# Patient Record
Sex: Female | Born: 1975 | Race: White | Hispanic: No | Marital: Married | Smoking: Former smoker
Health system: Southern US, Community
[De-identification: ages and names within clinical notes are randomized; demographics above are authoritative.]

## PROBLEM LIST (undated history)

## (undated) DIAGNOSIS — O09299 Supervision of pregnancy with other poor reproductive or obstetric history, unspecified trimester: Secondary | ICD-10-CM

## (undated) DIAGNOSIS — J45909 Unspecified asthma, uncomplicated: Secondary | ICD-10-CM

## (undated) DIAGNOSIS — N390 Urinary tract infection, site not specified: Secondary | ICD-10-CM

## (undated) DIAGNOSIS — U099 Post covid-19 condition, unspecified: Secondary | ICD-10-CM

## (undated) DIAGNOSIS — R51 Headache: Secondary | ICD-10-CM

## (undated) DIAGNOSIS — Z5189 Encounter for other specified aftercare: Secondary | ICD-10-CM

## (undated) DIAGNOSIS — L509 Urticaria, unspecified: Secondary | ICD-10-CM

## (undated) DIAGNOSIS — N809 Endometriosis, unspecified: Secondary | ICD-10-CM

## (undated) DIAGNOSIS — Z3490 Encounter for supervision of normal pregnancy, unspecified, unspecified trimester: Principal | ICD-10-CM

## (undated) DIAGNOSIS — J069 Acute upper respiratory infection, unspecified: Secondary | ICD-10-CM

## (undated) DIAGNOSIS — T783XXA Angioneurotic edema, initial encounter: Secondary | ICD-10-CM

## (undated) HISTORY — PX: ANKLE SURGERY: SHX546

## (undated) HISTORY — PX: OVARIAN CYST REMOVAL: SHX89

## (undated) HISTORY — DX: Post covid-19 condition, unspecified: U09.9

## (undated) HISTORY — DX: Endometriosis, unspecified: N80.9

## (undated) HISTORY — PX: LAPAROSCOPY: SHX197

## (undated) HISTORY — DX: Headache: R51

## (undated) HISTORY — PX: LYSIS OF ADHESION: SHX5961

## (undated) HISTORY — DX: Unspecified asthma, uncomplicated: J45.909

## (undated) HISTORY — PX: BLADDER SURGERY: SHX569

## (undated) HISTORY — DX: Encounter for other specified aftercare: Z51.89

## (undated) HISTORY — DX: Acute upper respiratory infection, unspecified: J06.9

## (undated) HISTORY — DX: Supervision of pregnancy with other poor reproductive or obstetric history, unspecified trimester: O09.299

## (undated) HISTORY — DX: Angioneurotic edema, initial encounter: T78.3XXA

## (undated) HISTORY — DX: Urticaria, unspecified: L50.9

## (undated) HISTORY — DX: Urinary tract infection, site not specified: N39.0

---

## 2011-06-25 NOTE — L&D Delivery Note (Signed)
Delivery Note At 7:34 AM a viable female was delivered via Vaginal, Spontaneous Delivery (Presentation: Left Occiput Posterior).  APGAR: 9, 9; weight P .   Placenta status: Intact, Spontaneous.  Cord: 3 vessels with the following complications: Nuchal.    Anesthesia: Epidural  Episiotomy: none   Lacerations: 2nd degree;Perineal Suture Repair: 3.0 vicryl rapide Est. Blood Loss (mL): 400cc  Mom to postpartum.  Baby to stay with mom.  BOVARD,Tivon Lemoine 06/02/2012, 8:01 AM  BR/ A+/ Contra at 6 wk nuvaring/RI

## 2011-10-15 LAB — OB RESULTS CONSOLE HEPATITIS B SURFACE ANTIGEN: Hepatitis B Surface Ag: NEGATIVE

## 2011-10-15 LAB — OB RESULTS CONSOLE ABO/RH: RH Type: POSITIVE

## 2011-10-15 LAB — OB RESULTS CONSOLE ANTIBODY SCREEN: Antibody Screen: NEGATIVE

## 2011-10-15 LAB — OB RESULTS CONSOLE RUBELLA ANTIBODY, IGM: Rubella: IMMUNE

## 2012-05-26 ENCOUNTER — Telehealth (HOSPITAL_COMMUNITY): Payer: Self-pay | Admitting: *Deleted

## 2012-05-26 ENCOUNTER — Encounter (HOSPITAL_COMMUNITY): Payer: Self-pay | Admitting: *Deleted

## 2012-05-26 NOTE — Telephone Encounter (Signed)
Preadmission screen  

## 2012-05-30 ENCOUNTER — Encounter (HOSPITAL_COMMUNITY): Payer: Self-pay

## 2012-05-30 ENCOUNTER — Inpatient Hospital Stay (HOSPITAL_COMMUNITY)
Admission: AD | Admit: 2012-05-30 | Discharge: 2012-05-30 | Disposition: A | Discharge status: Home / Self Care | Payer: BC Managed Care – PPO | Source: Ambulatory Visit | Attending: Obstetrics and Gynecology | Admitting: Obstetrics and Gynecology

## 2012-05-30 DIAGNOSIS — O479 False labor, unspecified: Secondary | ICD-10-CM | POA: Insufficient documentation

## 2012-05-30 NOTE — MAU Note (Signed)
Patient states she is having contractions every 4 minutes. Denies any leaking or bleeding and reports good fetal movement.

## 2012-05-30 NOTE — Progress Notes (Signed)
Written and verbal d/c instructions given and understanding voiced. 

## 2012-05-30 NOTE — Progress Notes (Signed)
Dr Ambrose Mantle notified of pt's admission and status. Aware of ctx pattern, sve, reactive strip, hx PPH with transfusion, low lying placenta with this preg that resolved. Will reck cervix and d/c home if no change

## 2012-06-01 ENCOUNTER — Encounter (HOSPITAL_COMMUNITY): Payer: Self-pay

## 2012-06-01 DIAGNOSIS — Z3490 Encounter for supervision of normal pregnancy, unspecified, unspecified trimester: Secondary | ICD-10-CM

## 2012-06-01 HISTORY — DX: Encounter for supervision of normal pregnancy, unspecified, unspecified trimester: Z34.90

## 2012-06-01 MED ORDER — COMPLETENATE 29-1 MG PO CHEW
2.0000 | CHEWABLE_TABLET | Freq: Every day | ORAL | Status: DC
  Filled 2012-06-01: qty 2

## 2012-06-01 MED ORDER — FAMOTIDINE 20 MG PO TABS
20.0000 mg | ORAL_TABLET | Freq: Every day | ORAL | Status: DC
  Filled 2012-06-01: qty 1

## 2012-06-01 NOTE — H&P (Signed)
Cheryl Wright is a 36 y.o. female [phone] at 40wk for iol given term and favorable cervix.  Pregnancy complicated by h/o PIH and 36 wk iol.  Also PP Hmg, requiring blood transfusion.  Pregnancy had posterior placenta previa, now resolved as of [place] 11/14.  +FM, no LOF, no VB, occ ctx.D/W pt r/b/a of IOL.  Transfer of care at 30 wk.  Maternal Medical History:  Contractions: Frequency: irregular.   Perceived severity is moderate.    Fetal activity: Perceived fetal activity is normal.    Prenatal complications: no prenatal complications   OB History    Grav Para Term Preterm Abortions TAB SAB Ect Mult Living   3 1  1 1  1  1 2     [id number] G1 Missed AB, cytotec G2 twins, 36 wk IOL, VAVD x 2, PP Hmg, IVF G3 present No abn pap, no STDs Past Medical History  Diagnosis Date  . Endometriosis   . Frequent UTI   . Headache     migraine  . Blood transfusion without reported diagnosis     pp hemorrhage 2011 twin delivery  . Hx of preeclampsia, prior pregnancy, currently pregnant   . Normal pregnancy 06/01/2012   Past Surgical History  Procedure Date  . Ankle surgery   . Laparoscopy   . Bladder surgery     dilation   Family History: family history includes Cancer in her paternal grandfather and paternal grandmother; Hyperlipidemia in her mother; Hypertension in her mother; and Rheum arthritis in her father. Social History:  reports that she has quit smoking. She has never used smokeless tobacco. She reports that she does not drink alcohol or use illicit drugs.married Meds PNV, Prilosec ALL PCN, no latex  Prenatal Transfer Tool  Maternal Diabetes: No Genetic Screening: Normal Maternal Ultrasounds/Referrals: Abnormal:  Findings:   Other:previa Fetal Ultrasounds or other Referrals:  None Maternal Substance Abuse:  No Significant Maternal Medications:  None Significant Maternal Lab Results:  Lab values include: Group B Strep negative Other Comments:  h/o PP Hmg, h/o PreE and 36wk  iol, h/o post previa - resolved  Review of Systems  Constitutional: Negative.   HENT: Negative.   Eyes: Negative.   Respiratory: Negative.   Cardiovascular: Negative.   Gastrointestinal: Negative.   Genitourinary: Negative.   Musculoskeletal: Negative.   Skin: Negative.   Neurological: Negative.   Psychiatric/Behavioral: Negative.       Last menstrual period 08/27/2011, unknown if currently breastfeeding. Maternal Exam:  Abdomen: Fundal height is appropriate for gestation.   Estimated fetal weight is 7-8#.   Fetal presentation: vertex  Introitus: Normal vulva. Normal vagina.  Pelvis: adequate for delivery.   Cervix: Cervix evaluated by digital exam.     Physical Exam  Constitutional: She is oriented to person, place, and time. She appears well-developed and well-nourished.  HENT:  Head: Normocephalic and atraumatic.  Eyes: Conjunctivae normal are normal. Pupils are equal, round, and reactive to light.  Neck: Normal range of motion. Neck supple.  Cardiovascular: Normal rate and regular rhythm.   Respiratory: Effort normal and breath sounds normal. No respiratory distress.  GI: Soft. Bowel sounds are normal. There is no tenderness.  Musculoskeletal: Normal range of motion.  Neurological: She is alert and oriented to person, place, and time.  Skin: Skin is warm and dry.  Psychiatric: She has a normal mood and affect. Her behavior is normal.    Prenatal labs: ABO, Rh: A/Positive/-- (04/23 0000) Antibody: Negative (04/23 0000) Rubella: Immune (04/23 0000) RPR:  Nonreactive (04/23 0000)  HBsAg: Negative (04/23 0000)  HIV: Non-reactive (04/23 0000)  GBS: Negative (11/18 0000)  Hgb 14.1/ Pap WNL HR HPV neg/Ur Cx neg/ Plt 259K/ GC neg/ Chl neg/ glucola 91/ CF neg/First Tri Scr neg  [place] nl AFI, nl anat, female post previa Resolved 11/14  Assessment/Plan: 36yo [id number] at 40+ for IOL given term and elective gbbs neg, no prophylaxis H/o PP Hmg - cytotec at delivery AROM and  pitocin Epidural prn  Cheryl Wright,Cheryl Wright 06/01/2012, 8:17 PM

## 2012-06-02 ENCOUNTER — Inpatient Hospital Stay (HOSPITAL_COMMUNITY)
Admission: AD | Admit: 2012-06-02 | Discharge: 2012-06-04 | DRG: 373 | Disposition: A | Discharge status: Home / Self Care | Payer: BC Managed Care – PPO | Source: Ambulatory Visit | Attending: Obstetrics and Gynecology | Admitting: Obstetrics and Gynecology

## 2012-06-02 ENCOUNTER — Encounter (HOSPITAL_COMMUNITY): Payer: Self-pay | Admitting: *Deleted

## 2012-06-02 ENCOUNTER — Inpatient Hospital Stay (HOSPITAL_COMMUNITY): Payer: BC Managed Care – PPO | Admitting: Anesthesiology

## 2012-06-02 ENCOUNTER — Encounter (HOSPITAL_COMMUNITY): Payer: Self-pay | Admitting: Anesthesiology

## 2012-06-02 ENCOUNTER — Encounter (HOSPITAL_COMMUNITY): Payer: Self-pay | Admitting: Obstetrics and Gynecology

## 2012-06-02 ENCOUNTER — Inpatient Hospital Stay (HOSPITAL_COMMUNITY)
Admission: RE | Admit: 2012-06-02 | Discharge: 2012-06-02 | Payer: BC Managed Care – PPO | Source: Ambulatory Visit | Attending: Obstetrics and Gynecology | Admitting: Obstetrics and Gynecology

## 2012-06-02 DIAGNOSIS — Z3490 Encounter for supervision of normal pregnancy, unspecified, unspecified trimester: Secondary | ICD-10-CM

## 2012-06-02 HISTORY — DX: Encounter for supervision of normal pregnancy, unspecified, unspecified trimester: Z34.90

## 2012-06-02 LAB — CBC
HCT: 40.9 % (ref 36.0–46.0)
Hemoglobin: 13.9 g/dL (ref 12.0–15.0)
MCH: 34.4 pg — ABNORMAL HIGH (ref 26.0–34.0)
MCHC: 34 g/dL (ref 30.0–36.0)
MCV: 101.2 fL — ABNORMAL HIGH (ref 78.0–100.0)
Platelets: 191 10*3/uL (ref 150–400)
RBC: 4.04 MIL/uL (ref 3.87–5.11)
RDW: 13.8 % (ref 11.5–15.5)
WBC: 13.3 10*3/uL — ABNORMAL HIGH (ref 4.0–10.5)

## 2012-06-02 LAB — TYPE AND SCREEN
ABO/RH(D): A POS
Antibody Screen: NEGATIVE

## 2012-06-02 MED ORDER — DIPHENHYDRAMINE HCL 25 MG PO CAPS
25.0000 mg | ORAL_CAPSULE | Freq: Four times a day (QID) | ORAL | Status: DC | PRN
  Administered 2012-06-02: 25 mg via ORAL
  Filled 2012-06-02: qty 1

## 2012-06-02 MED ORDER — FENTANYL 2.5 MCG/ML BUPIVACAINE 1/10 % EPIDURAL INFUSION (WH - ANES)
INTRAMUSCULAR | Status: DC | PRN
  Administered 2012-06-02: 15 mL/h via EPIDURAL

## 2012-06-02 MED ORDER — LIDOCAINE HCL (PF) 1 % IJ SOLN
30.0000 mL | INTRAMUSCULAR | Status: DC | PRN
  Filled 2012-06-02: qty 30

## 2012-06-02 MED ORDER — PHENYLEPHRINE 40 MCG/ML (10ML) SYRINGE FOR IV PUSH (FOR BLOOD PRESSURE SUPPORT): 80.0000 ug | PREFILLED_SYRINGE | INTRAVENOUS | Status: DC | PRN

## 2012-06-02 MED ORDER — SENNOSIDES-DOCUSATE SODIUM 8.6-50 MG PO TABS
2.0000 | ORAL_TABLET | Freq: Every day | ORAL | Status: DC
  Administered 2012-06-02 – 2012-06-03 (×2): 2 via ORAL

## 2012-06-02 MED ORDER — FENTANYL 2.5 MCG/ML BUPIVACAINE 1/10 % EPIDURAL INFUSION (WH - ANES)
14.0000 mL/h | INTRAMUSCULAR | Status: DC
  Filled 2012-06-02: qty 125

## 2012-06-02 MED ORDER — LACTATED RINGERS IV SOLN
500.0000 mL | Freq: Once | INTRAVENOUS | Status: AC
  Administered 2012-06-02: 1000 mL via INTRAVENOUS

## 2012-06-02 MED ORDER — DIBUCAINE 1 % RE OINT
1.0000 "application " | TOPICAL_OINTMENT | RECTAL | Status: DC | PRN
  Filled 2012-06-02 (×2): qty 28

## 2012-06-02 MED ORDER — OXYCODONE-ACETAMINOPHEN 5-325 MG PO TABS
1.0000 | ORAL_TABLET | ORAL | Status: DC | PRN
  Administered 2012-06-03: 1 via ORAL
  Filled 2012-06-02: qty 1

## 2012-06-02 MED ORDER — CITRIC ACID-SODIUM CITRATE 334-500 MG/5ML PO SOLN
30.0000 mL | ORAL | Status: DC | PRN
  Filled 2012-06-02: qty 15

## 2012-06-02 MED ORDER — TETANUS-DIPHTH-ACELL PERTUSSIS 5-2.5-18.5 LF-MCG/0.5 IM SUSP: 0.5000 mL | Freq: Once | INTRAMUSCULAR | Status: DC

## 2012-06-02 MED ORDER — FAMOTIDINE 20 MG PO TABS
20.0000 mg | ORAL_TABLET | Freq: Every day | ORAL | Status: DC
  Administered 2012-06-02 – 2012-06-04 (×3): 20 mg via ORAL
  Filled 2012-06-02 (×3): qty 1

## 2012-06-02 MED ORDER — COMPLETENATE 29-1 MG PO CHEW
1.0000 | CHEWABLE_TABLET | Freq: Every day | ORAL | Status: DC
  Administered 2012-06-02 – 2012-06-04 (×3): 1 via ORAL
  Filled 2012-06-02 (×3): qty 1

## 2012-06-02 MED ORDER — LIDOCAINE HCL (PF) 1 % IJ SOLN
INTRAMUSCULAR | Status: DC | PRN
  Administered 2012-06-02: 5 mL
  Administered 2012-06-02: 4 mL

## 2012-06-02 MED ORDER — ONDANSETRON HCL 4 MG PO TABS: 4.0000 mg | ORAL_TABLET | ORAL | Status: DC | PRN

## 2012-06-02 MED ORDER — OXYTOCIN BOLUS FROM INFUSION: 500.0000 mL | INTRAVENOUS | Status: DC

## 2012-06-02 MED ORDER — ACETAMINOPHEN 325 MG PO TABS: 650.0000 mg | ORAL_TABLET | ORAL | Status: DC | PRN

## 2012-06-02 MED ORDER — IBUPROFEN 600 MG PO TABS
600.0000 mg | ORAL_TABLET | Freq: Four times a day (QID) | ORAL | Status: DC
  Administered 2012-06-02 – 2012-06-04 (×8): 600 mg via ORAL
  Filled 2012-06-02 (×8): qty 1

## 2012-06-02 MED ORDER — EPHEDRINE 5 MG/ML INJ
10.0000 mg | INTRAVENOUS | Status: DC | PRN
  Filled 2012-06-02: qty 4

## 2012-06-02 MED ORDER — DIPHENHYDRAMINE HCL 50 MG/ML IJ SOLN: 12.5000 mg | INTRAMUSCULAR | Status: DC | PRN

## 2012-06-02 MED ORDER — ONDANSETRON HCL 4 MG/2ML IJ SOLN: 4.0000 mg | INTRAMUSCULAR | Status: DC | PRN

## 2012-06-02 MED ORDER — IBUPROFEN 600 MG PO TABS: 600.0000 mg | ORAL_TABLET | Freq: Four times a day (QID) | ORAL | Status: DC | PRN

## 2012-06-02 MED ORDER — LANOLIN HYDROUS EX OINT: TOPICAL_OINTMENT | CUTANEOUS | Status: DC | PRN

## 2012-06-02 MED ORDER — PHENYLEPHRINE 40 MCG/ML (10ML) SYRINGE FOR IV PUSH (FOR BLOOD PRESSURE SUPPORT)
80.0000 ug | PREFILLED_SYRINGE | INTRAVENOUS | Status: DC | PRN
  Filled 2012-06-02: qty 5

## 2012-06-02 MED ORDER — EPHEDRINE 5 MG/ML INJ: 10.0000 mg | INTRAVENOUS | Status: DC | PRN

## 2012-06-02 MED ORDER — ONDANSETRON HCL 4 MG/2ML IJ SOLN: 4.0000 mg | Freq: Four times a day (QID) | INTRAMUSCULAR | Status: DC | PRN

## 2012-06-02 MED ORDER — FLEET ENEMA 7-19 GM/118ML RE ENEM: 1.0000 | ENEMA | RECTAL | Status: DC | PRN

## 2012-06-02 MED ORDER — WITCH HAZEL-GLYCERIN EX PADS: 1.0000 "application " | MEDICATED_PAD | CUTANEOUS | Status: DC | PRN

## 2012-06-02 MED ORDER — OXYCODONE-ACETAMINOPHEN 5-325 MG PO TABS: 1.0000 | ORAL_TABLET | ORAL | Status: DC | PRN

## 2012-06-02 MED ORDER — PRENATAL MULTIVITAMIN CH: 1.0000 | ORAL_TABLET | Freq: Every day | ORAL | Status: DC

## 2012-06-02 MED ORDER — SIMETHICONE 80 MG PO CHEW
80.0000 mg | CHEWABLE_TABLET | ORAL | Status: DC | PRN
  Administered 2012-06-02 – 2012-06-04 (×2): 80 mg via ORAL

## 2012-06-02 MED ORDER — BENZOCAINE-MENTHOL 20-0.5 % EX AERO
1.0000 "application " | INHALATION_SPRAY | CUTANEOUS | Status: DC | PRN
  Filled 2012-06-02 (×2): qty 56

## 2012-06-02 MED ORDER — LACTATED RINGERS IV SOLN
INTRAVENOUS | Status: DC
  Administered 2012-06-02: 04:00:00 via INTRAVENOUS

## 2012-06-02 MED ORDER — PRENATAL 19 29-1 MG PO CHEW: 2.0000 | CHEWABLE_TABLET | Freq: Every day | ORAL | Status: DC

## 2012-06-02 MED ORDER — ZOLPIDEM TARTRATE 5 MG PO TABS: 5.0000 mg | ORAL_TABLET | Freq: Every evening | ORAL | Status: DC | PRN

## 2012-06-02 MED ORDER — LACTATED RINGERS IV SOLN: INTRAVENOUS | Status: DC

## 2012-06-02 MED ORDER — OXYTOCIN 40 UNITS IN LACTATED RINGERS INFUSION - SIMPLE MED
62.5000 mL/h | INTRAVENOUS | Status: DC
  Administered 2012-06-02: 62.5 mL/h via INTRAVENOUS
  Filled 2012-06-02: qty 1000

## 2012-06-02 MED ORDER — MISOPROSTOL 200 MCG PO TABS
ORAL_TABLET | ORAL | Status: AC
  Filled 2012-06-02: qty 4

## 2012-06-02 MED ORDER — LACTATED RINGERS IV SOLN: 500.0000 mL | INTRAVENOUS | Status: DC | PRN

## 2012-06-02 NOTE — MAU Note (Signed)
ROM at 0230, contractions worsening

## 2012-06-02 NOTE — Anesthesia Procedure Notes (Signed)
Epidural Patient location during procedure: OB Start time: 06/02/2012 5:20 AM  Staffing Anesthesiologist: Hilding Quintanar A. Performed by: anesthesiologist   Preanesthetic Checklist Completed: patient identified, site marked, surgical consent, pre-op evaluation, timeout performed, IV checked, risks and benefits discussed and monitors and equipment checked  Epidural Patient position: sitting Prep: site prepped and draped and DuraPrep Patient monitoring: continuous pulse ox and blood pressure Approach: midline Injection technique: LOR air  Needle:  Needle type: Tuohy  Needle gauge: 17 G Needle length: 9 cm and 9 Needle insertion depth: 5 cm cm Catheter type: closed end flexible Catheter size: 19 Gauge Catheter at skin depth: 10 cm Test dose: negative and Other  Assessment Events: blood not aspirated, injection not painful, no injection resistance, negative IV test and no paresthesia  Additional Notes Patient identified. Risks and benefits discussed including failed block, incomplete  Pain control, post dural puncture headache, nerve damage, paralysis, blood pressure Changes, nausea, vomiting, reactions to medications-both toxic and allergic and post Partum back pain. All questions were answered. Patient expressed understanding and wished to proceed. Sterile technique was used throughout procedure. Epidural site was Dressed with sterile barrier dressing. No paresthesias, signs of intravascular injection Or signs of intrathecal spread were encountered.  Patient was more comfortable after the epidural was dosed. Please see RN's note for documentation of vital signs and FHR which are stable.

## 2012-06-02 NOTE — Progress Notes (Signed)
Transferred pt to room 110 via wheelchair, holding infant, FOB at pt's side, report given to Mother/baby nurse.

## 2012-06-02 NOTE — Anesthesia Postprocedure Evaluation (Signed)
  Anesthesia Post-op Note  Patient: Cheryl Wright  Procedure(s) Performed: * No procedures listed *  Patient Location: PACU and Mother/Baby  Anesthesia Type:Epidural  Level of Consciousness: awake, alert , oriented and patient cooperative  Airway and Oxygen Therapy: Patient Spontanous Breathing  Post-op Pain: none  Post-op Assessment: Post-op Vital signs reviewed and Patient's Cardiovascular Status Stable  Post-op Vital Signs: Reviewed and stable  Complications: No apparent anesthesia complications

## 2012-06-02 NOTE — Anesthesia Preprocedure Evaluation (Signed)
Anesthesia Evaluation  Patient identified by MRN, date of birth, ID band Patient awake    Reviewed: Allergy & Precautions, H&P , Patient's Chart, lab work & pertinent test results  Airway Mallampati: II TM Distance: >3 FB Neck ROM: full    Dental No notable dental hx. (+) Teeth Intact   Pulmonary neg pulmonary ROS,  breath sounds clear to auscultation  Pulmonary exam normal       Cardiovascular negative cardio ROS  Rhythm:regular Rate:Normal     Neuro/Psych  Headaches, negative psych ROS   GI/Hepatic Neg liver ROS, Medicated and Controlled,  Endo/Other  negative endocrine ROS  Renal/GU negative Renal ROS  negative genitourinary   Musculoskeletal   Abdominal Normal abdominal exam  (+)   Peds  Hematology Hx/o Blood transfusion for [facility]   Anesthesia Other Findings   Reproductive/Obstetrics (+) Pregnancy                           Anesthesia Physical Anesthesia Plan  ASA: II  Anesthesia Plan: Epidural   Post-op Pain Management:    Induction:   Airway Management Planned:   Additional Equipment:   Intra-op Plan:   Post-operative Plan:   Informed Consent: I have reviewed the patients History and Physical, chart, labs and discussed the procedure including the risks, benefits and alternatives for the proposed anesthesia with the patient or authorized representative who has indicated his/her understanding and acceptance.     Plan Discussed with: Anesthesiologist  Anesthesia Plan Comments:         Anesthesia Quick Evaluation

## 2012-06-02 NOTE — Progress Notes (Addendum)
Scheduled for induction later this am, here for reg ctx and leaking fluid, VE 4 cm, membranes ruptured. FHT- Cat I Ok to walk for now at pt request

## 2012-06-03 LAB — CBC
MCV: 102 fL — ABNORMAL HIGH (ref 78.0–100.0)
Platelets: 154 10*3/uL (ref 150–400)
RDW: 14.4 % (ref 11.5–15.5)
WBC: 12.3 10*3/uL — ABNORMAL HIGH (ref 4.0–10.5)

## 2012-06-03 MED ORDER — DOCUSATE SODIUM 100 MG PO CAPS
100.0000 mg | ORAL_CAPSULE | Freq: Two times a day (BID) | ORAL | Status: DC
  Administered 2012-06-03 – 2012-06-04 (×2): 100 mg via ORAL
  Filled 2012-06-03 (×2): qty 1

## 2012-06-03 MED ORDER — HYDROCODONE-ACETAMINOPHEN 5-325 MG PO TABS
1.0000 | ORAL_TABLET | ORAL | Status: DC | PRN
  Administered 2012-06-03 – 2012-06-04 (×5): 1 via ORAL
  Filled 2012-06-03: qty 2
  Filled 2012-06-03 (×4): qty 1

## 2012-06-03 NOTE — Progress Notes (Signed)
Post Partum Day 1 Subjective: no complaints, up ad lib, voiding, tolerating PO and nl lochia, pain controlled  Objective: Blood pressure 97/58, pulse 76, temperature 97.7 F (36.5 C), temperature source Oral, resp. rate 18, height 5\' 8"  (1.727 m), weight 77.111 kg (170 lb), last menstrual period 08/27/2011, SpO2 98.00%, unknown if currently breastfeeding.  Physical Exam:  General: alert and no distress Lochia: appropriate Uterine Fundus: firm   Basename 06/03/12 0545 06/02/12 0415  HGB 10.3* 13.9  HCT 30.7* 40.9    Assessment/Plan: Plan for discharge tomorrow, Breastfeeding and Lactation consult.  Doing well.  Routine care.  Possible d/c this PM, change percocet to hydrocodone, add colace   LOS: 1 day   BOVARD,Linden Mikes 06/03/2012, 8:34 AM

## 2012-06-04 LAB — RPR: RPR Ser Ql: NONREACTIVE

## 2012-06-04 MED ORDER — IBUPROFEN 800 MG PO TABS: 800.0000 mg | ORAL_TABLET | Freq: Three times a day (TID) | ORAL | Status: DC | PRN

## 2012-06-04 MED ORDER — PRENATAL 19 29-1 MG PO CHEW: 2.0000 | CHEWABLE_TABLET | Freq: Every day | ORAL | Status: DC

## 2012-06-04 MED ORDER — HYDROCODONE-ACETAMINOPHEN 5-325 MG PO TABS: 1.0000 | ORAL_TABLET | Freq: Four times a day (QID) | ORAL | Status: DC | PRN

## 2012-06-04 NOTE — Discharge Summary (Signed)
Obstetric Discharge Summary Reason for Admission: onset of labor Prenatal Procedures: none Intrapartum Procedures: spontaneous vaginal delivery Postpartum Procedures: none Complications-Operative and Postpartum: 2nd degree perineal laceration Hemoglobin  Date Value Range Status  06/03/2012 10.3* 12.0 - 15.0 g/dL Final     REPEATED TO VERIFY     DELTA CHECK NOTED     HCT  Date Value Range Status  06/03/2012 30.7* 36.0 - 46.0 % Final    Physical Exam:  General: alert and no distress Lochia: appropriate Uterine Fundus: firm   Discharge Diagnoses: Term Pregnancy-delivered  Discharge Information: Date: 06/04/2012 Activity: pelvic rest Diet: routine Medications: PNV, Ibuprofen and Vicodin, Colace OTC Condition: stable Instructions: refer to practice specific booklet Discharge to: home Follow-up Information    Follow up with BOVARD,Wyn Nettle, MD. Schedule an appointment as soon as possible for a visit in 6 weeks.   Contact information:   [address] [place] [address] [phone]          Newborn Data: Live born female  Birth Weight: 8 lb 10.5 oz (3925 g) APGAR: 9, 9  Home with mother.  BOVARD,Breah Joa 06/04/2012, 8:55 AM

## 2012-06-04 NOTE — Progress Notes (Signed)
Post Partum Day 2 Subjective: no complaints, up ad lib, voiding, tolerating PO and nl lochia, pain controlled  Objective: Blood pressure 100/59, pulse 61, temperature 97.8 F (36.6 C), temperature source Oral, resp. rate 18, height 5\' 8"  (1.727 m), weight 77.111 kg (170 lb), last menstrual period 08/27/2011, SpO2 98.00%, unknown if currently breastfeeding.  Physical Exam:  General: alert and no distress Lochia: appropriate Uterine Fundus: firm Basename 06/03/12 0545 06/02/12 0415  HGB 10.3* 13.9  HCT 30.7* 40.9    Assessment/Plan: Discharge home, Breastfeeding and Lactation consult D/C with Motrin/hydrocodone/ pnv, f/u 6 weeks   LOS: 2 days   BOVARD,Derin Granquist 06/04/2012, 8:35 AM

## 2012-06-10 NOTE — H&P (Signed)
Cheryl Wright is a 36 y.o. female [phone] at 40wk for iol given term and favorable cervix.  Pregnancy complicated by h/o PIH and 36 wk iol.  Also PP Hmg, requiring blood transfusion.  Pregnancy had posterior placenta previa, now resolved as of [place] 11/14.  +FM, no LOF, no VB, occ ctx.D/W pt r/b/a of IOL.  Transfer of care at 30 wk.  Maternal Medical History:  Contractions: Frequency: irregular.   Perceived severity is moderate.    Fetal activity: Perceived fetal activity is normal.    Prenatal complications: no prenatal complications   OB History    Grav Para Term Preterm Abortions TAB SAB Ect Mult Living   4 2 1 1 1  1  1 3     [id number] G1 Missed AB, cytotec G2 twins, 36 wk IOL, VAVD x 2, PP Hmg, IVF G3 present No abn pap, no STDs Past Medical History  Diagnosis Date  . Endometriosis   . Frequent UTI   . Headache     migraine  . Blood transfusion without reported diagnosis     pp hemorrhage 2011 twin delivery  . Hx of preeclampsia, prior pregnancy, currently pregnant   . Normal pregnancy 06/01/2012  . SVD (spontaneous vaginal delivery) 06/02/2012   Past Surgical History  Procedure Date  . Ankle surgery   . Laparoscopy   . Bladder surgery     dilation   Family History: family history includes Cancer in her paternal grandfather and paternal grandmother; Hyperlipidemia in her mother; Hypertension in her mother; and Rheum arthritis in her father. Social History:  reports that she has quit smoking. She has never used smokeless tobacco. She reports that she does not drink alcohol or use illicit drugs.married Meds PNV, Prilosec ALL PCN, no latex  Prenatal Transfer Tool  Maternal Diabetes: No Genetic Screening: Normal Maternal Ultrasounds/Referrals: Abnormal:  Findings:   Other:previa Fetal Ultrasounds or other Referrals:  None Maternal Substance Abuse:  No Significant Maternal Medications:  None Significant Maternal Lab Results:  Lab values include: Group B Strep  negative Other Comments:  h/o PP Hmg, h/o PreE and 36wk iol, h/o post previa - resolved  Review of Systems  Constitutional: Negative.   HENT: Negative.   Eyes: Negative.   Respiratory: Negative.   Cardiovascular: Negative.   Gastrointestinal: Negative.   Genitourinary: Negative.   Musculoskeletal: Negative.   Skin: Negative.   Neurological: Negative.   Psychiatric/Behavioral: Negative.     Dilation: 10 Effacement (%): 100 Station: 0 Exam by:: c soliz rn Blood pressure 100/59, pulse 61, temperature 97.8 F (36.6 C), temperature source Oral, resp. rate 18, height 5\' 8"  (1.727 m), weight 77.111 kg (170 lb), last menstrual period 08/27/2011, SpO2 98.00%, unknown if currently breastfeeding. Maternal Exam:  Abdomen: Fundal height is appropriate for gestation.   Estimated fetal weight is 7-8#.   Fetal presentation: vertex  Introitus: Normal vulva. Normal vagina.  Pelvis: adequate for delivery.   Cervix: Cervix evaluated by digital exam.     Physical Exam  Constitutional: She is oriented to person, place, and time. She appears well-developed and well-nourished.  HENT:  Head: Normocephalic and atraumatic.  Eyes: Conjunctivae normal are normal. Pupils are equal, round, and reactive to light.  Neck: Normal range of motion. Neck supple.  Cardiovascular: Normal rate and regular rhythm.   Respiratory: Effort normal and breath sounds normal. No respiratory distress.  GI: Soft. Bowel sounds are normal. There is no tenderness.  Musculoskeletal: Normal range of motion.  Neurological: She is alert and  oriented to person, place, and time.  Skin: Skin is warm and dry.  Psychiatric: She has a normal mood and affect. Her behavior is normal.    Prenatal labs: ABO, Rh: --/--/A POS, A POS (12/10 0415) Antibody: NEG (12/10 0415) Rubella: Immune (04/23 0000) RPR: NON REACTIVE (12/10 0415)  HBsAg: Negative (04/23 0000)  HIV: Non-reactive (04/23 0000)  GBS: Negative (11/18 0000)  Hgb 14.1/  Pap WNL HR HPV neg/Ur Cx neg/ Plt 259K/ GC neg/ Chl neg/ glucola 91/ CF neg/First Tri Scr neg  [place] nl AFI, nl anat, female post previa Resolved 11/14  Assessment/Plan: 36yo [id number] at 40+ for IOL given term and elective gbbs neg, no prophylaxis H/o PP Hmg - cytotec at delivery AROM and pitocin Epidural prn  Cheryl Wright,Cheryl Wright 06/10/2012, 12:31 PM

## 2012-12-11 ENCOUNTER — Emergency Department (HOSPITAL_COMMUNITY)
Admission: EM | Admit: 2012-12-11 | Discharge: 2012-12-11 | Disposition: A | Discharge status: Home / Self Care | Payer: BC Managed Care – PPO | Source: Home / Self Care

## 2012-12-11 ENCOUNTER — Encounter (HOSPITAL_COMMUNITY): Payer: Self-pay | Admitting: Emergency Medicine

## 2012-12-11 DIAGNOSIS — M533 Sacrococcygeal disorders, not elsewhere classified: Secondary | ICD-10-CM

## 2012-12-11 DIAGNOSIS — N3090 Cystitis, unspecified without hematuria: Secondary | ICD-10-CM

## 2012-12-11 DIAGNOSIS — M545 Low back pain, unspecified: Secondary | ICD-10-CM

## 2012-12-11 DIAGNOSIS — N3091 Cystitis, unspecified with hematuria: Secondary | ICD-10-CM

## 2012-12-11 LAB — POCT URINALYSIS DIP (DEVICE)
Nitrite: POSITIVE — AB
Protein, ur: 100 mg/dL — AB
Specific Gravity, Urine: 1.01 (ref 1.005–1.030)
Urobilinogen, UA: 0.2 mg/dL (ref 0.0–1.0)

## 2012-12-11 LAB — POCT PREGNANCY, URINE: Preg Test, Ur: NEGATIVE

## 2012-12-11 MED ORDER — SULFAMETHOXAZOLE-TRIMETHOPRIM 800-160 MG PO TABS: 1.0000 | ORAL_TABLET | Freq: Two times a day (BID) | ORAL | Status: DC

## 2012-12-11 NOTE — ED Provider Notes (Signed)
Medical screening examination/treatment/procedure(s) were performed by non-physician practitioner and as supervising physician I was immediately available for consultation/collaboration.  Leslee Home, M.D.  Reuben Likes, MD 12/11/12 2106

## 2012-12-11 NOTE — ED Notes (Signed)
Pt c/o poss uti onset Sat... sxs include: blood in the urine this afternoon and lower back pain... Denies: f/v/d, dysuria... She is alert and oriented w/no signs of acute distress.

## 2012-12-11 NOTE — ED Provider Notes (Signed)
History     CSN: [id number]  Arrival date & time 12/11/12  1844   First MD Initiated Contact with Patient 12/11/12 1927      Chief Complaint  Patient presents with  . Hematuria    (Consider location/radiation/quality/duration/timing/severity/associated sxs/prior treatment) HPI Comments: 37 year old female states that she believes she has a UTI. She noticed some urgency of urination partially 5 days ago. She denies frequency. She has had left low back pain that she had originally attributed to lifting 3 children and caring them in her arms. This afternoon while voiding she had gross hematuria which is the reason that prompted this visit. She denies dysuria. Her back pain is located in the left lower back but there is also tenderness. She also complains of pain in the left flank for which there is percussion tenderness. There is one point over the left para thoracic rehabilitation is also tender.   Past Medical History  Diagnosis Date  . Endometriosis   . Frequent UTI   . Headache(784.0)     migraine  . Blood transfusion without reported diagnosis     pp hemorrhage 2011 twin delivery  . Hx of preeclampsia, prior pregnancy, currently pregnant   . Normal pregnancy 06/01/2012  . SVD (spontaneous vaginal delivery) 06/02/2012    Past Surgical History  Procedure Laterality Date  . Ankle surgery    . Laparoscopy    . Bladder surgery      dilation    Family History  Problem Relation Age of Onset  . Hyperlipidemia Mother   . Hypertension Mother   . Rheum arthritis Father   . Cancer Paternal Grandmother     colon  . Cancer Paternal Grandfather     brain, and lung    History  Substance Use Topics  . Smoking status: Former Games developer  . Smokeless tobacco: Never Used  . Alcohol Use: No    OB History   Grav Para Term Preterm Abortions TAB SAB Ect Mult Living   4 2 1 1 1  1  1 3       Review of Systems  Constitutional: Negative.   HENT: Negative.   Respiratory: Negative.    Cardiovascular: Negative.   Genitourinary: Positive for urgency, hematuria and flank pain. Negative for dysuria, frequency, vaginal bleeding, vaginal discharge, difficulty urinating and pelvic pain.  Musculoskeletal: Negative.     Allergies  Penicillins  Home Medications   Current Outpatient Rx  Name  Route  Sig  Dispense  Refill  . sertraline (ZOLOFT) 50 MG tablet   Oral   Take 50 mg by mouth daily.         . diphenhydrAMINE (BENADRYL) 25 MG tablet   Oral   Take 25 mg by mouth once.         Marland Kitchen HYDROcodone-acetaminophen (NORCO/VICODIN) 5-325 MG per tablet   Oral   Take 1-2 tablets by mouth every 6 (six) hours as needed.   15 tablet   0   . ibuprofen (ADVIL,MOTRIN) 800 MG tablet   Oral   Take 1 tablet (800 mg total) by mouth every 8 (eight) hours as needed for pain.   40 tablet   1   . Prenatal Vit-Fe Fumarate-FA (PRENATAL 19) 29-1 MG CHEW   Oral   Chew 2 tablets by mouth daily.   60 tablet   12   . ranitidine (ZANTAC) 75 MG tablet   Oral   Take 75-150 mg by mouth daily as needed. For heartburn         .  sulfamethoxazole-trimethoprim (SEPTRA DS) 800-160 MG per tablet   Oral   Take 1 tablet by mouth 2 (two) times daily.   10 tablet   0     BP 95/57  Pulse 62  Temp(Src) 97.4 F (36.3 C)  Resp 16  SpO2 97%  Breastfeeding? Yes  Physical Exam  Nursing note and vitals reviewed. Constitutional: She is oriented to person, place, and time. She appears well-nourished. No distress.  Neck: Normal range of motion. Neck supple.  Cardiovascular: Normal rate.   Pulmonary/Chest: Effort normal. No respiratory distress.  Musculoskeletal: Normal range of motion.  Mild tenderness of the left lower para lumbosacral musculature just above the left hip. There is also percussion tenderness in the left flank. No bruising, swelling or deformity. She is able to lift her baby in both arms as well as the infant in the carriage. She has been bending forward and straightening  you react while lifting these objects fluidly and rapidly.  Neurological: She is alert and oriented to person, place, and time. She exhibits normal muscle tone.  Skin: Skin is warm and dry.  Psychiatric: She has a normal mood and affect.    ED Course  Procedures (including critical care time)  Labs Reviewed  POCT URINALYSIS DIP (DEVICE) - Abnormal; Notable for the following:    Hgb urine dipstick LARGE (*)    Protein, ur 100 (*)    Nitrite POSITIVE (*)    Leukocytes, UA SMALL (*)    All other components within normal limits  URINE CULTURE  POCT PREGNANCY, URINE   No results found.   1. Hemorrhagic cystitis   2. Back pain, lumbosacral       MDM  Septra DS bid for 5 d Read nursing precautions. Per AAPCommittee st compatible with breast feeding unless infant is premature or unhealthy.  Plenty of fluids        Hayden Rasmussen, NP 12/11/12 2008

## 2012-12-13 LAB — URINE CULTURE: Colony Count: >=100000

## 2012-12-13 NOTE — ED Notes (Signed)
Urine culture: >100,000 colonies E. Coli.  Pt. adequately treated with Septra DS.  Vassie Moselle 12/13/2012

## 2012-12-15 ENCOUNTER — Telehealth: Payer: Self-pay | Admitting: *Deleted

## 2012-12-15 NOTE — Telephone Encounter (Signed)
Pt left me a message stating that she has received my letter.  I called and left her a message to call me back so I can schedule her a genetic appt.

## 2013-04-07 ENCOUNTER — Telehealth: Payer: Self-pay | Admitting: *Deleted

## 2013-04-07 NOTE — Telephone Encounter (Signed)
Pt called to schedule her genetic appt and I confirmed 07/08/13 genetic appt w/ pt.

## 2013-07-08 ENCOUNTER — Encounter: Payer: Self-pay | Admitting: Genetic Counselor

## 2013-07-08 ENCOUNTER — Ambulatory Visit (HOSPITAL_BASED_OUTPATIENT_CLINIC_OR_DEPARTMENT_OTHER): Payer: BC Managed Care – PPO | Admitting: Genetic Counselor

## 2013-07-08 ENCOUNTER — Other Ambulatory Visit: Payer: BC Managed Care – PPO

## 2013-07-08 DIAGNOSIS — Z800 Family history of malignant neoplasm of digestive organs: Secondary | ICD-10-CM

## 2013-07-08 DIAGNOSIS — IMO0002 Reserved for concepts with insufficient information to code with codable children: Secondary | ICD-10-CM

## 2013-07-08 DIAGNOSIS — Z8041 Family history of malignant neoplasm of ovary: Secondary | ICD-10-CM

## 2013-07-08 DIAGNOSIS — Z803 Family history of malignant neoplasm of breast: Secondary | ICD-10-CM

## 2013-07-08 NOTE — Progress Notes (Signed)
Dr.  Rana Snare Bovard requested a consultation for genetic counseling and risk assessment for Cheryl Wright, a 38 y.o. female, for discussion of her family history of breast, ovarian and colon cancer.  She presents to clinic today to discuss the possibility of a genetic predisposition to cancer, and to further clarify her risks, as well as her family members' risks for cancer.   HISTORY OF PRESENT ILLNESS: Cheryl Wright is a 38 y.o. female with no personal history of cancer.  She has a history of endometriosis and has been on infertility medication including femera, oral clomid, and multiple injections.    Past Medical History  Diagnosis Date  . Endometriosis   . Frequent UTI   . Headache(784.0)     migraine  . Blood transfusion without reported diagnosis     pp hemorrhage 2011 twin delivery  . Hx of preeclampsia, prior pregnancy, currently pregnant   . Normal pregnancy 06/01/2012  . SVD (spontaneous vaginal delivery) 06/02/2012    Past Surgical History  Procedure Laterality Date  . Ankle surgery    . Laparoscopy    . Bladder surgery      dilation    History   Social History  . Marital Status: Married    Spouse Name: N/A    Number of Children: 3  . Years of Education: 4 year col   Social History Main Topics  . Smoking status: Former Smoker -- 2 years    Types: Cigarettes  . Smokeless tobacco: Never Used  . Alcohol Use: Yes     Comment: 3-4/week  . Drug Use: No  . Sexual Activity: Yes     Comment: preg   Other Topics Concern  . None   Social History Narrative  . None    REPRODUCTIVE HISTORY AND PERSONAL RISK ASSESSMENT FACTORS: Menarche was at age 78.   premenopausal Uterus Intact: yes Ovaries Intact: yes G3P2A0, first live birth at age 89  She has previously undergone treatment for infertility.   Oral Contraceptive use: 7 years   She has not used HRT in the past.    FAMILY HISTORY:  We obtained a detailed, 4-generation family history.   Significant diagnoses are listed below: Family History  Problem Relation Age of Onset  . Hyperlipidemia Mother   . Hypertension Mother   . Rheum arthritis Father   . Lung cancer Father     mesothelioma from [employer]  . Breast cancer Paternal Grandmother 56  . Colon cancer Paternal Grandmother 35  . Lung cancer Paternal Grandfather     dx in his 50s; smoker  . Stroke Maternal Grandmother   . Ovarian cancer Other     paternal grandfather's 2 sisters and mother in their 24s-60s  . Breast cancer Other     father's paternal first cousin dx in her 56s  . Breast cancer Other     paternal grandmother's mother dx in her 26s  The patient's paternal aunt had a TAH-BSO because of endometriosis and the family history of ovarian cancer.  She has not been tested.  The patient's mother has 62 sisters and 3 brothers, none of whom have cancer.  The patietn's grandfather had two sisters who had ovarian cancer in their 67s and his mother had ovarian cancer in her 72s.  His brother had a daughter who died of breast cancer in her 78s.  Patient's maternal ancestors are of [place] descent, and paternal ancestors are of [place] descent. There is no  reported Ashkenazi Jewish ancestry. There is no known consanguinity.  GENETIC COUNSELING ASSESSMENT: Cheryl Wright is a 38 y.o. female with a family history of breast, ovarian, and colon cancer which somewhat suggestive of a hereditary breast and ovarian cancer syndrome and predisposition to cancer. We, therefore, discussed and recommended the following at today's visit.   DISCUSSION: We reviewed the characteristics, features and inheritance patterns of hereditary cancer syndromes. We also discussed genetic testing, including the appropriate family members to test, the process of testing, insurance coverage and turn-around-time for results. We reviewed several genes that increase the risk for ovarian cancer, specifically BRCA, but also Lynch  genes.  Discussed that the history of breast cancer and ovarian cancer make HBOC more likely than Lynch.  Discussed that her aunt or father was a more informative person to test as they are closer living relatives to the affected individuals.  However her father has Medicare, and her aunt is unlikely to get tested.  We discussed that if she is negative, it is not informative for the family and that her aunt should consider testing, and her brother may want to consider testing as well.  PLAN: After considering the risks, benefits, and limitations, Cheryl Wright provided informed consent to pursue genetic testing and the blood sample will be sent to [facility] for analysis of the Breast/Ovarian Cancer panel. We discussed the implications of a positive, negative and/ or variant of uncertain significance genetic test result. Results should be available within approximately 3 weeks' time, at which point they will be disclosed by telephone to [facility], as will any additional recommendations warranted by these results. Cheryl Wright will receive a summary of her genetic counseling visit and a copy of her results once available. This information will also be available in Epic. We encouraged Cheryl Wright to remain in contact with cancer genetics annually so that we can continuously update the family history and inform her of any changes in cancer genetics and testing that may be of benefit for her family. Cheryl Wright questions were answered to her satisfaction today. Our contact information was provided should additional questions or concerns arise.  The patient was seen for a total of 60 minutes, greater than 50% of which was spent face-to-face counseling.  This note will also be sent to the referring provider via the electronic medical record. The patient will be supplied with a summary of this genetic counseling discussion as well as educational information on the discussed  hereditary cancer syndromes following the conclusion of their visit.   Patient was discussed with Dr. Marcy Panning.   _______________________________________________________________________ For Office Staff:  Number of people involved in session: 1 Was an Intern/ student involved with case: no

## 2013-07-20 ENCOUNTER — Encounter: Payer: Self-pay | Admitting: Genetic Counselor

## 2013-07-20 ENCOUNTER — Telehealth: Payer: Self-pay | Admitting: Genetic Counselor

## 2013-07-20 NOTE — Telephone Encounter (Signed)
Revealed negative genetic testing.  This does not tell us what is happening in the family, but it does not appear that she has a deleterious mutation within these genes.  She has a likely benign variant in CDH1 which all evidence to date suggests that this is likely benign.

## 2014-04-25 ENCOUNTER — Encounter: Payer: Self-pay | Admitting: Genetic Counselor

## 2016-07-25 ENCOUNTER — Other Ambulatory Visit: Payer: Self-pay | Admitting: Obstetrics and Gynecology

## 2016-07-25 DIAGNOSIS — R928 Other abnormal and inconclusive findings on diagnostic imaging of breast: Secondary | ICD-10-CM

## 2016-08-01 ENCOUNTER — Ambulatory Visit
Admission: RE | Admit: 2016-08-01 | Discharge: 2016-08-01 | Disposition: A | Discharge status: Home / Self Care | Payer: BC Managed Care – PPO | Source: Ambulatory Visit | Attending: Obstetrics and Gynecology | Admitting: Obstetrics and Gynecology

## 2016-08-01 DIAGNOSIS — R928 Other abnormal and inconclusive findings on diagnostic imaging of breast: Secondary | ICD-10-CM

## 2017-06-25 ENCOUNTER — Other Ambulatory Visit: Payer: Self-pay | Admitting: Obstetrics and Gynecology

## 2017-06-25 DIAGNOSIS — N6310 Unspecified lump in the right breast, unspecified quadrant: Secondary | ICD-10-CM

## 2017-06-30 ENCOUNTER — Other Ambulatory Visit: Payer: Self-pay | Admitting: Obstetrics and Gynecology

## 2017-06-30 DIAGNOSIS — N644 Mastodynia: Secondary | ICD-10-CM

## 2017-06-30 DIAGNOSIS — N63 Unspecified lump in unspecified breast: Secondary | ICD-10-CM

## 2017-07-03 ENCOUNTER — Ambulatory Visit
Admission: RE | Admit: 2017-07-03 | Discharge: 2017-07-03 | Disposition: A | Discharge status: Home / Self Care | Payer: BC Managed Care – PPO | Source: Ambulatory Visit | Attending: Obstetrics and Gynecology | Admitting: Obstetrics and Gynecology

## 2017-07-03 DIAGNOSIS — N644 Mastodynia: Secondary | ICD-10-CM

## 2017-07-03 DIAGNOSIS — N63 Unspecified lump in unspecified breast: Secondary | ICD-10-CM

## 2017-07-03 DIAGNOSIS — N6310 Unspecified lump in the right breast, unspecified quadrant: Secondary | ICD-10-CM

## 2018-09-02 ENCOUNTER — Other Ambulatory Visit: Payer: Self-pay | Admitting: Obstetrics and Gynecology

## 2018-09-02 DIAGNOSIS — N6320 Unspecified lump in the left breast, unspecified quadrant: Secondary | ICD-10-CM

## 2018-09-02 DIAGNOSIS — N6310 Unspecified lump in the right breast, unspecified quadrant: Secondary | ICD-10-CM

## 2018-09-08 ENCOUNTER — Other Ambulatory Visit: Payer: BC Managed Care – PPO

## 2018-09-25 ENCOUNTER — Ambulatory Visit
Admission: RE | Admit: 2018-09-25 | Discharge: 2018-09-25 | Disposition: A | Discharge status: Home / Self Care | Payer: BC Managed Care – PPO | Source: Ambulatory Visit | Attending: Obstetrics and Gynecology | Admitting: Obstetrics and Gynecology

## 2018-09-25 ENCOUNTER — Other Ambulatory Visit: Payer: Self-pay

## 2018-09-25 DIAGNOSIS — N6310 Unspecified lump in the right breast, unspecified quadrant: Secondary | ICD-10-CM

## 2019-02-04 IMAGING — MG 2D DIGITAL DIAGNOSTIC BILATERAL MAMMOGRAM WITH CAD AND ADJUNCT T
8 of 15 series · 8 of 35 positions shown · non-contrast
Comparison: Previous exam(s).

CLINICAL DATA: 41-year-old female presenting with pain and focal
lump in the lateral left breast. This is also a delayed follow-up
for probably benign right breast masses evaluated in Monday July, 2016.

EXAM:
2D DIGITAL DIAGNOSTIC BILATERAL MAMMOGRAM WITH CAD AND ADJUNCT TOMO
ULTRASOUND BILATERAL BREAST

[R CC]
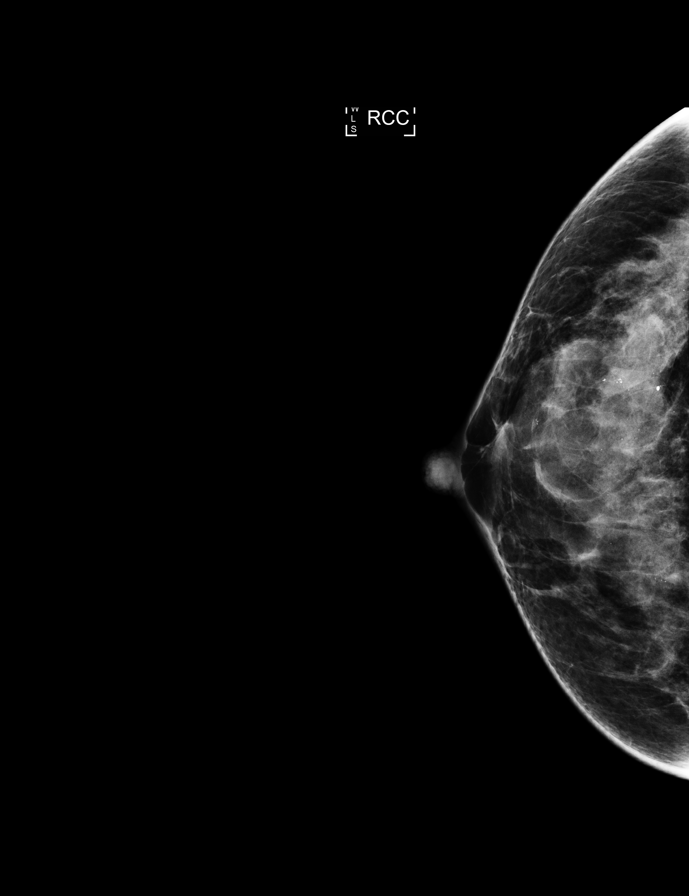

[L MLO]
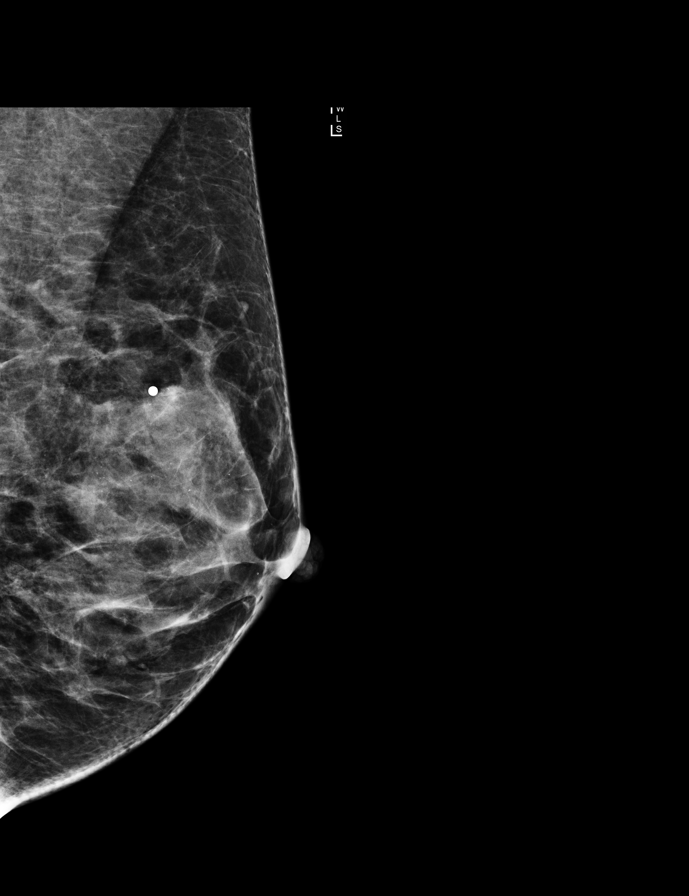

[L CC synth-2D (1 of 2)]
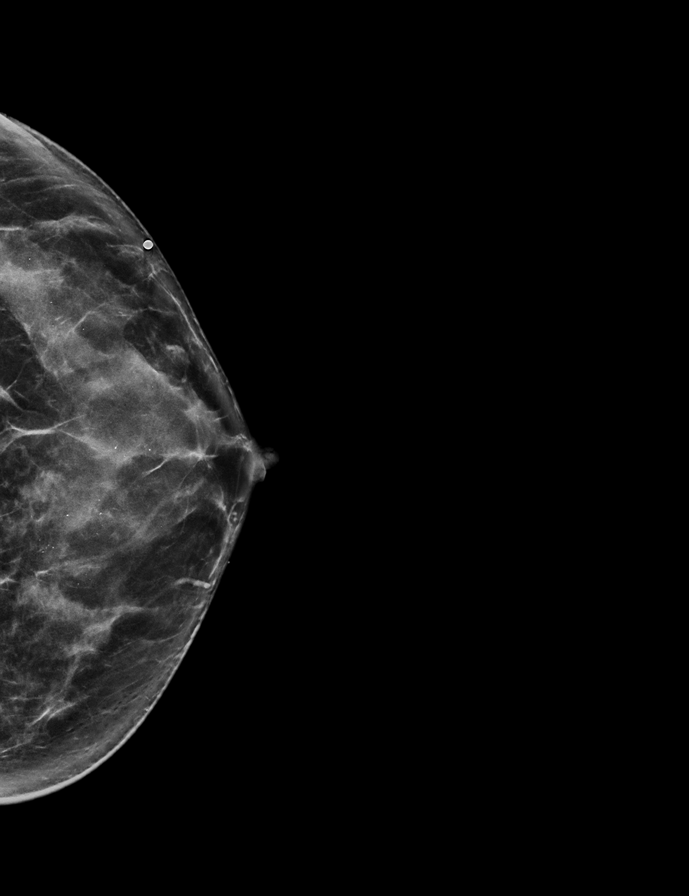

[R MLO]
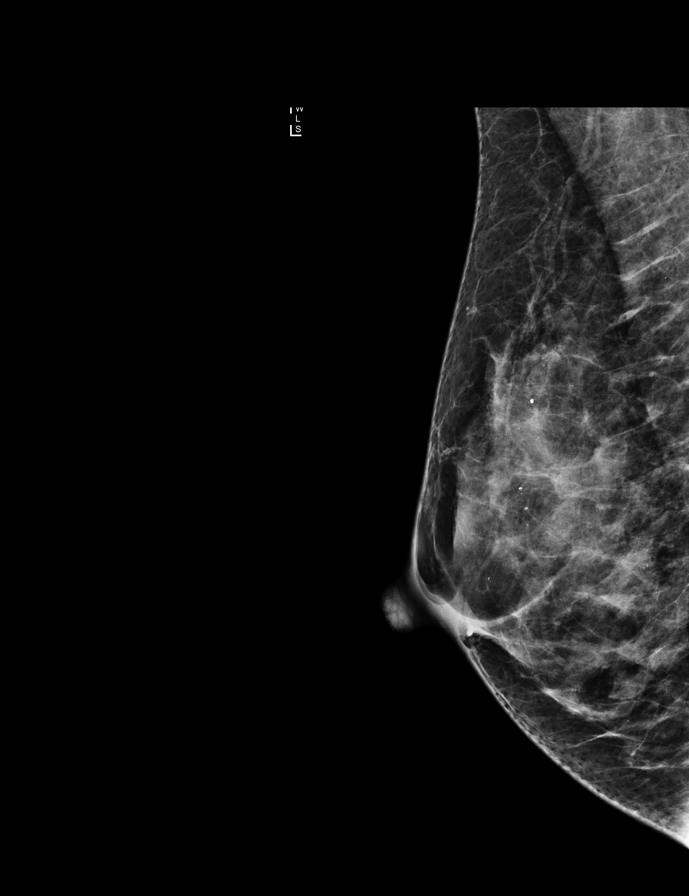

[R CC synth-2D]
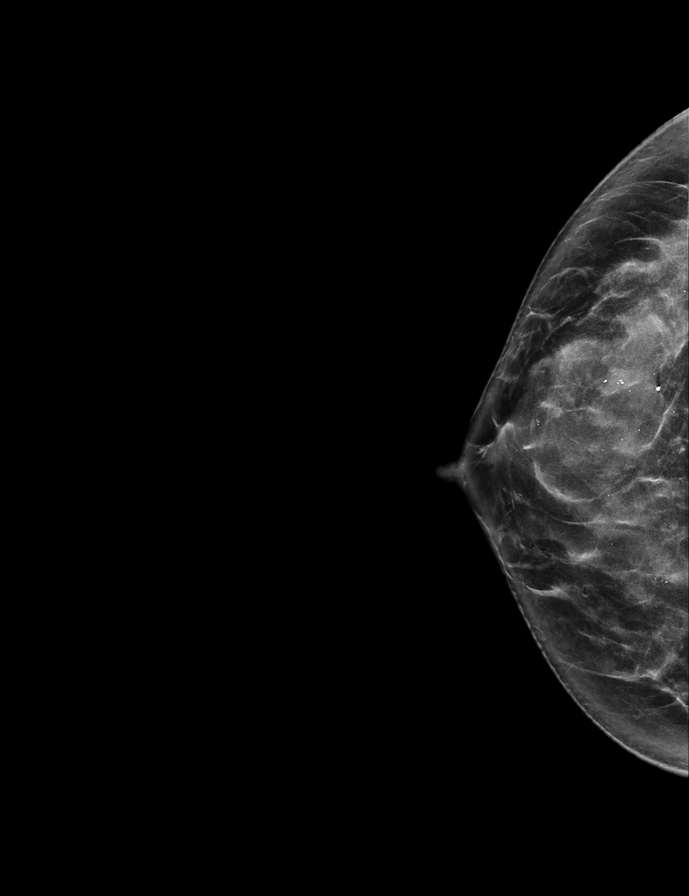

[L CC synth-2D (2 of 2)]
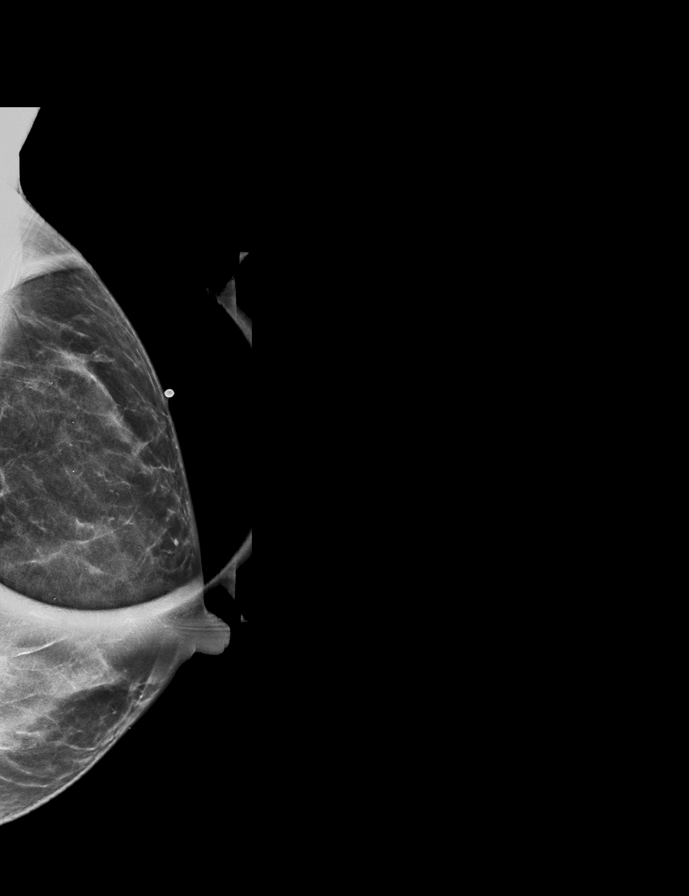

[R MLO synth-2D]
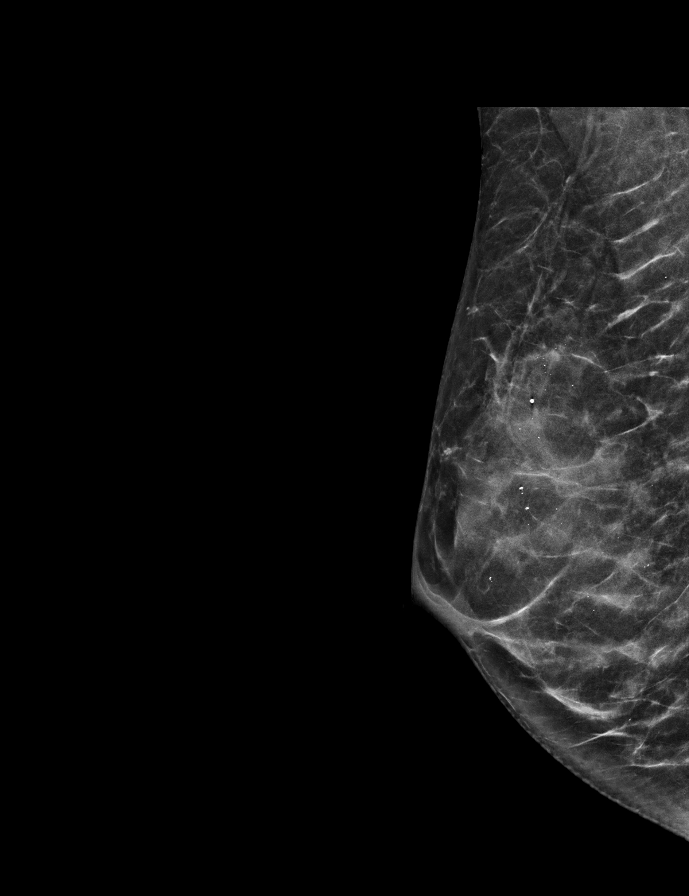

[L MLO synth-2D]
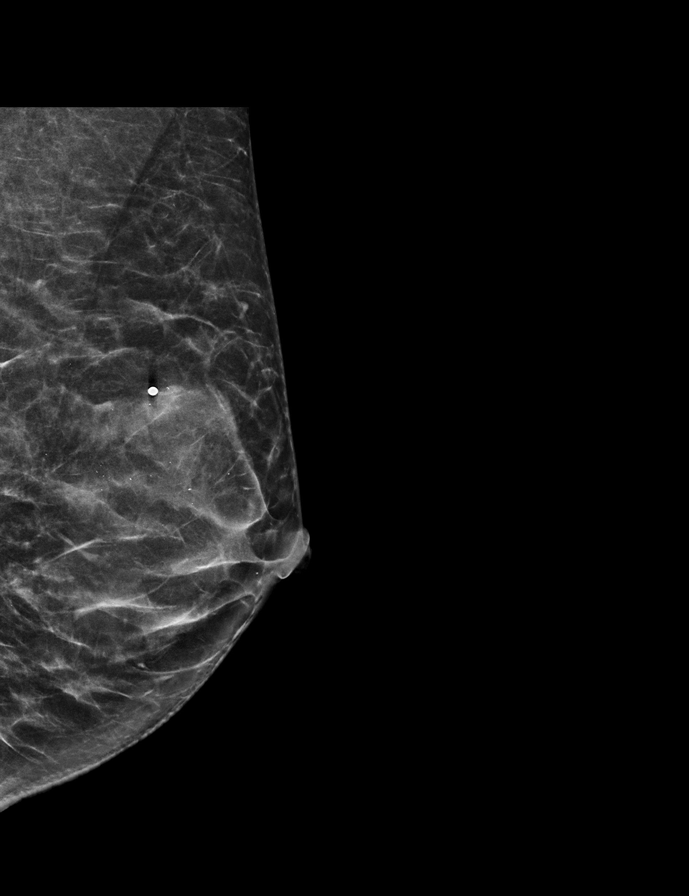

[8 of 35 positions shown; findings below may reference images not displayed]

ACR Breast Density Category d: The breast tissue is extremely dense,
which lowers the sensitivity of mammography.
FINDINGS: A radiopaque BB was placed at the site of the patient's palpable
abnormality in the upper outer quadrant of the left breast. No focal
or suspicious mammographic findings are seen deep to the radiopaque
BB

No suspicious masses or calcifications are identified in either
breast.

Mammographic images were processed with CAD.

On physical exam, I palpate heterogenous fibroglandular tissue in
the upper-outer quadrant of the left breast. No focal masses are
palpated.

Targeted ultrasound is performed, showing stable appearance of
hypoechoic masses within the right breast at the 12 o'clock position
3 cm from the nipple. They measure 1 x 1 x 0.5 cm and 0.6 x 0.5 x
0.2 cm.

Evaluation of the upper outer quadrant of the left breast
demonstrates an area of focal fibroglandular tissue and associated
simple cyst measuring 0.5 x 0.5 x 0.4 cm. No suspicious sonographic
findings are identified.
IMPRESSION: 1. Stable, probably benign masses within the right breast.
Recommendation is for ultrasound follow-up in 1 year to correspond
with the patient's annual bilateral mammogram.
2. Focal fibroglandular tissue within associated simple cyst in the
region of the patient's left breast palpable abnormality. No
suspicious mammographic or sonographic findings are identified.

RECOMMENDATION:
Bilateral diagnostic mammogram and right breast ultrasound in 1
year.

I have discussed the findings and recommendations with the patient.
Results were also provided in writing at the conclusion of the
visit. If applicable, a reminder letter will be sent to the patient
regarding the next appointment.

BI-RADS CATEGORY  3: Probably benign.

## 2019-02-04 IMAGING — US ULTRASOUND LEFT BREAST LIMITED
1 series · 7 of 7 positions shown · non-contrast
Comparison: Previous exam(s).

CLINICAL DATA: 41-year-old female presenting with pain and focal
lump in the lateral left breast. This is also a delayed follow-up
for probably benign right breast masses evaluated in Monday July, 2016.

EXAM:
2D DIGITAL DIAGNOSTIC BILATERAL MAMMOGRAM WITH CAD AND ADJUNCT TOMO
ULTRASOUND BILATERAL BREAST

[Series 1: ultrasound left breast limited · 0.06mm/px · 7 of 7 slices shown]
[im 1/7]
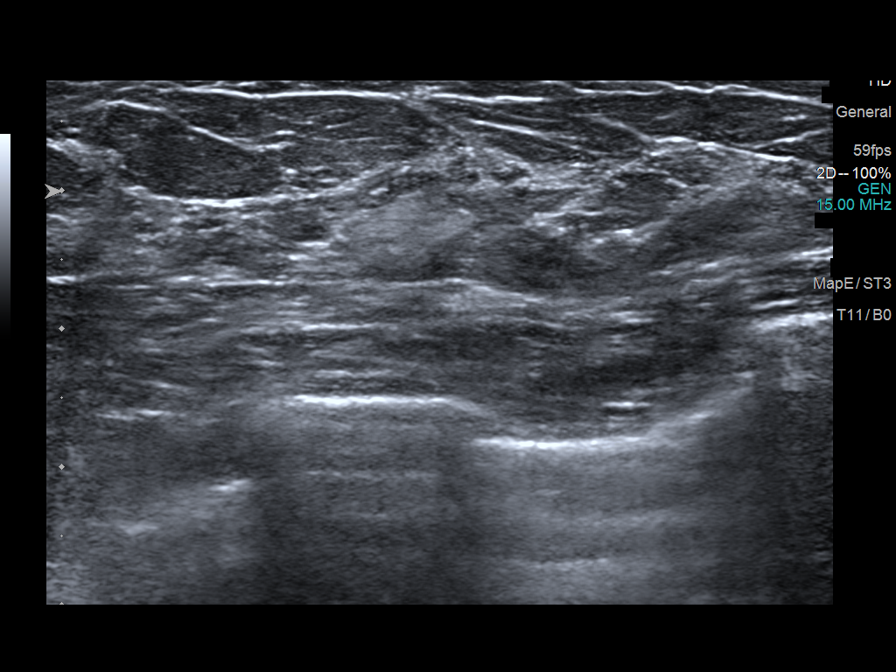
[im 2/7]
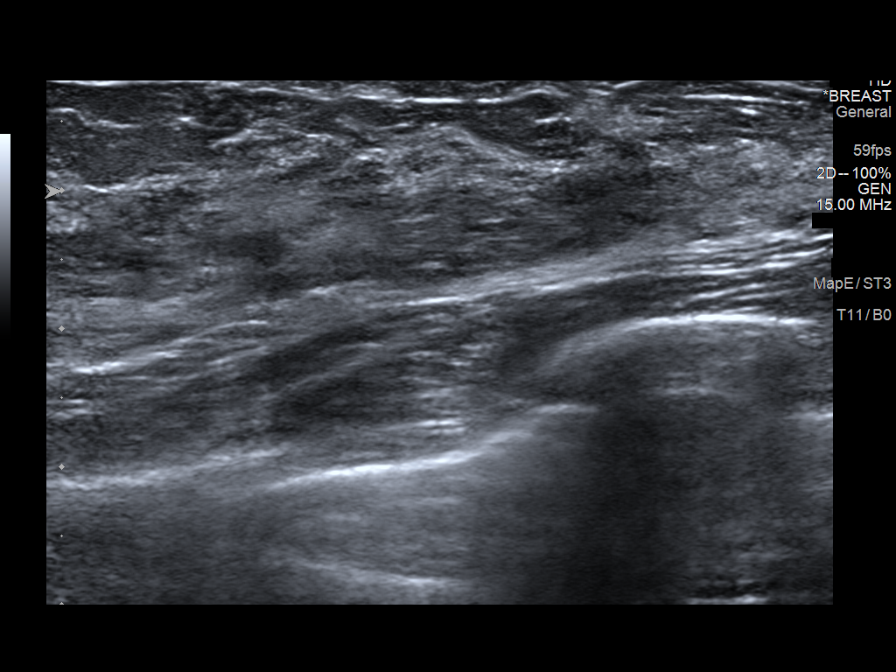
[im 3/7]
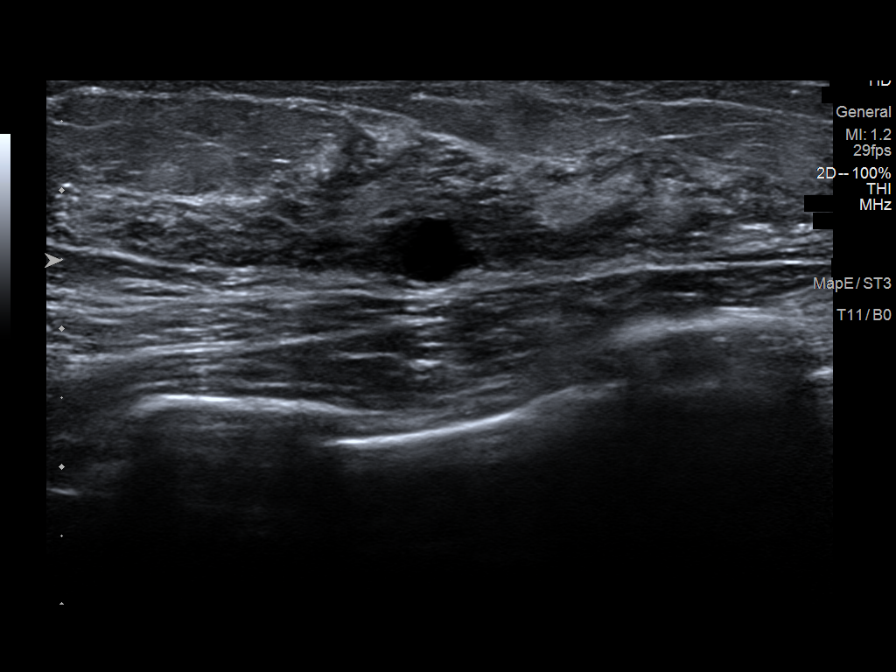
[im 4/7]
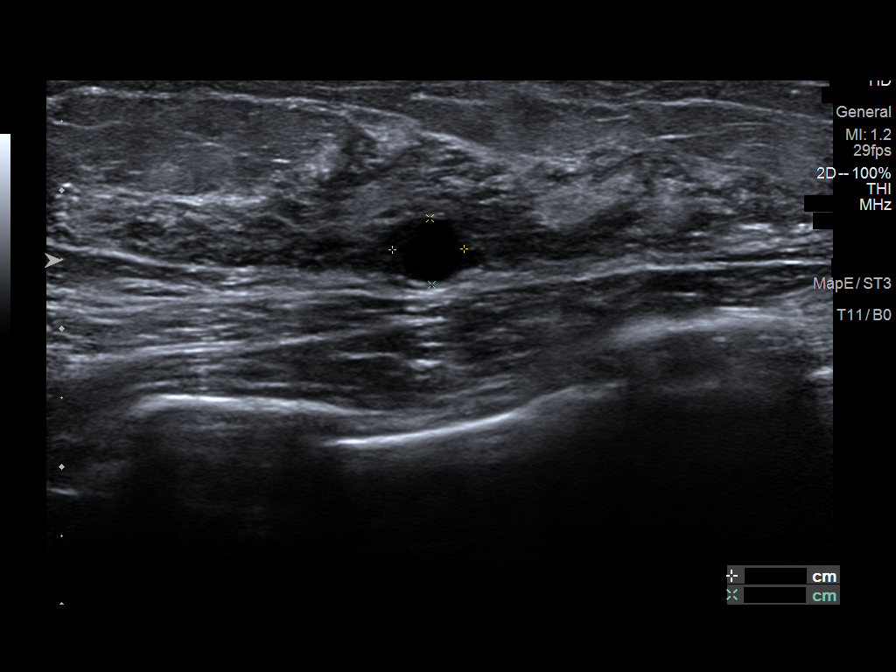
[im 5/7]
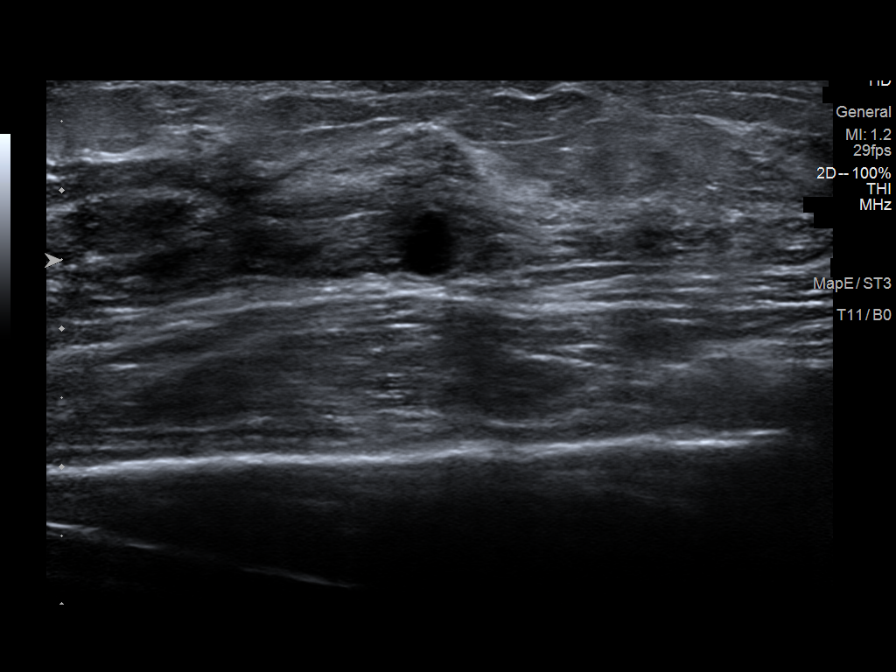
[im 6/7]
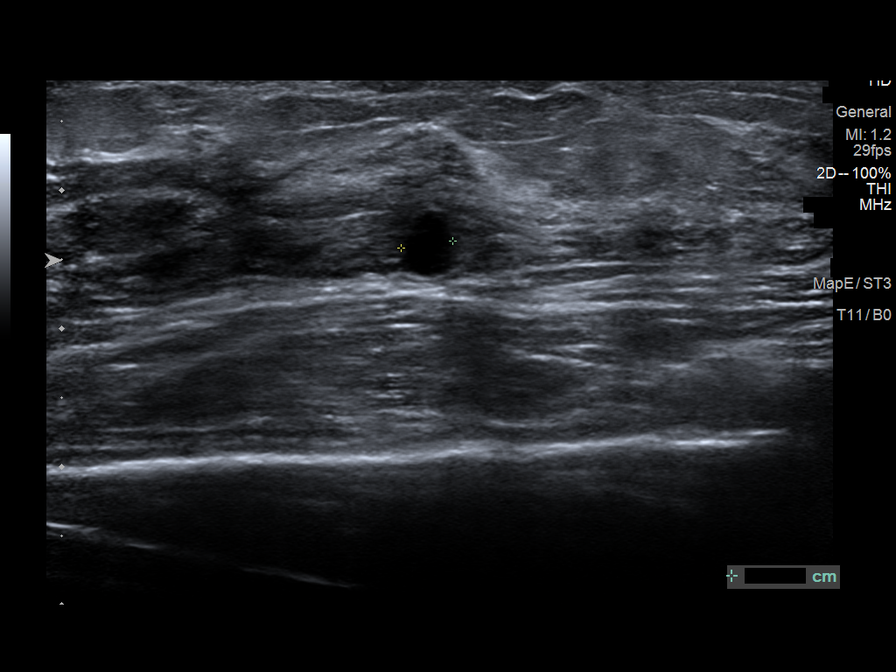
[im 7/7]
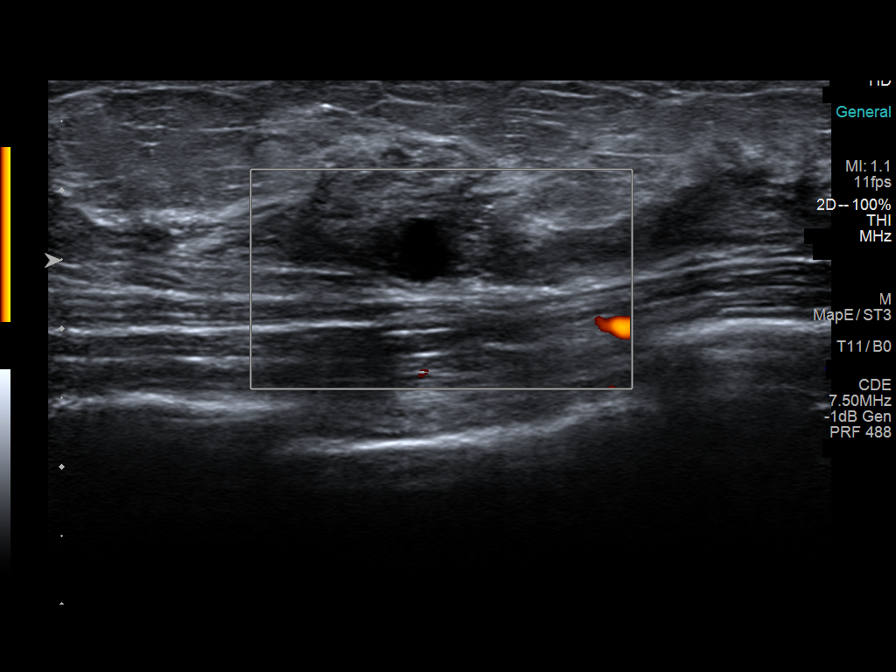

[7 of 7 positions shown; findings below may reference images not displayed]

ACR Breast Density Category d: The breast tissue is extremely dense,
which lowers the sensitivity of mammography.
FINDINGS: A radiopaque BB was placed at the site of the patient's palpable
abnormality in the upper outer quadrant of the left breast. No focal
or suspicious mammographic findings are seen deep to the radiopaque
BB

No suspicious masses or calcifications are identified in either
breast.

Mammographic images were processed with CAD.

On physical exam, I palpate heterogenous fibroglandular tissue in
the upper-outer quadrant of the left breast. No focal masses are
palpated.

Targeted ultrasound is performed, showing stable appearance of
hypoechoic masses within the right breast at the 12 o'clock position
3 cm from the nipple. They measure 1 x 1 x 0.5 cm and 0.6 x 0.5 x
0.2 cm.

Evaluation of the upper outer quadrant of the left breast
demonstrates an area of focal fibroglandular tissue and associated
simple cyst measuring 0.5 x 0.5 x 0.4 cm. No suspicious sonographic
findings are identified.
IMPRESSION: 1. Stable, probably benign masses within the right breast.
Recommendation is for ultrasound follow-up in 1 year to correspond
with the patient's annual bilateral mammogram.
2. Focal fibroglandular tissue within associated simple cyst in the
region of the patient's left breast palpable abnormality. No
suspicious mammographic or sonographic findings are identified.

RECOMMENDATION:
Bilateral diagnostic mammogram and right breast ultrasound in 1
year.

I have discussed the findings and recommendations with the patient.
Results were also provided in writing at the conclusion of the
visit. If applicable, a reminder letter will be sent to the patient
regarding the next appointment.

BI-RADS CATEGORY  3: Probably benign.

## 2019-09-10 ENCOUNTER — Ambulatory Visit: Payer: BC Managed Care – PPO | Attending: Internal Medicine

## 2019-09-10 DIAGNOSIS — Z23 Encounter for immunization: Secondary | ICD-10-CM

## 2019-09-10 NOTE — Progress Notes (Signed)
   Covid-19 Vaccination Clinic  Name:  Cheryl Wright    MRN: [id number] DOB: 09-Jan-1976  09/10/2019  Ms. Cheryl Wright was observed post Covid-19 immunization for 15 minutes without incident. She was provided with Vaccine Information Sheet and instruction to access the V-Safe system.   Ms. Cheryl Wright was instructed to call 911 with any severe reactions post vaccine: Marland Kitchen Difficulty breathing  . Swelling of face and throat  . A fast heartbeat  . A bad rash all over body  . Dizziness and weakness   Immunizations Administered    Name Date Dose VIS Date Route   Pfizer COVID-19 Vaccine 09/10/2019 10:07 AM 0.3 mL 06/04/2019 Intramuscular   Manufacturer: [employer], [employer]   Lot: [id number]   NDC: [address]-5

## 2019-09-21 ENCOUNTER — Other Ambulatory Visit: Payer: Self-pay | Admitting: Obstetrics and Gynecology

## 2019-09-21 DIAGNOSIS — Z1231 Encounter for screening mammogram for malignant neoplasm of breast: Secondary | ICD-10-CM

## 2019-09-27 ENCOUNTER — Other Ambulatory Visit: Payer: Self-pay

## 2019-09-27 ENCOUNTER — Ambulatory Visit
Admission: RE | Admit: 2019-09-27 | Discharge: 2019-09-27 | Disposition: A | Discharge status: Home / Self Care | Payer: BC Managed Care – PPO | Source: Ambulatory Visit

## 2019-09-27 DIAGNOSIS — Z1231 Encounter for screening mammogram for malignant neoplasm of breast: Secondary | ICD-10-CM

## 2019-09-28 ENCOUNTER — Other Ambulatory Visit: Payer: Self-pay | Admitting: Obstetrics and Gynecology

## 2019-09-28 DIAGNOSIS — R928 Other abnormal and inconclusive findings on diagnostic imaging of breast: Secondary | ICD-10-CM

## 2019-10-05 ENCOUNTER — Ambulatory Visit: Payer: BC Managed Care – PPO | Attending: Internal Medicine

## 2019-10-05 DIAGNOSIS — Z23 Encounter for immunization: Secondary | ICD-10-CM

## 2019-10-05 NOTE — Progress Notes (Signed)
   Covid-19 Vaccination Clinic  Name:  Cheryl Wright    MRN: [id number] DOB: 10-11-1975  10/05/2019  Ms. [name] was observed post Covid-19 immunization for 15 minutes without incident. She was provided with Vaccine Information Sheet and instruction to access the V-Safe system.   Ms. [name] was instructed to call 911 with any severe reactions post vaccine: Marland Kitchen Difficulty breathing  . Swelling of face and throat  . A fast heartbeat  . A bad rash all over body  . Dizziness and weakness   Immunizations Administered    Name Date Dose VIS Date Route   Pfizer COVID-19 Vaccine 10/05/2019 11:20 AM 0.3 mL 06/04/2019 Intramuscular   Manufacturer: [employer], [employer]   Lot: [id number]   NDC: [address]-0

## 2019-10-06 ENCOUNTER — Other Ambulatory Visit: Payer: BC Managed Care – PPO

## 2019-10-11 ENCOUNTER — Ambulatory Visit: Payer: BC Managed Care – PPO

## 2019-10-11 ENCOUNTER — Ambulatory Visit
Admission: RE | Admit: 2019-10-11 | Discharge: 2019-10-11 | Disposition: A | Discharge status: Home / Self Care | Payer: BC Managed Care – PPO | Source: Ambulatory Visit | Attending: Obstetrics and Gynecology | Admitting: Obstetrics and Gynecology

## 2019-10-11 ENCOUNTER — Other Ambulatory Visit: Payer: Self-pay

## 2019-10-11 DIAGNOSIS — R928 Other abnormal and inconclusive findings on diagnostic imaging of breast: Secondary | ICD-10-CM

## 2021-01-19 ENCOUNTER — Other Ambulatory Visit: Payer: Self-pay | Admitting: Obstetrics and Gynecology

## 2021-01-19 DIAGNOSIS — Z1231 Encounter for screening mammogram for malignant neoplasm of breast: Secondary | ICD-10-CM

## 2021-01-29 ENCOUNTER — Ambulatory Visit
Admission: RE | Admit: 2021-01-29 | Discharge: 2021-01-29 | Disposition: A | Discharge status: Home / Self Care | Payer: BC Managed Care – PPO | Source: Ambulatory Visit

## 2021-01-29 ENCOUNTER — Other Ambulatory Visit: Payer: Self-pay

## 2021-01-29 DIAGNOSIS — Z1231 Encounter for screening mammogram for malignant neoplasm of breast: Secondary | ICD-10-CM

## 2022-03-15 ENCOUNTER — Other Ambulatory Visit: Payer: Self-pay | Admitting: Obstetrics and Gynecology

## 2022-03-15 DIAGNOSIS — Z1231 Encounter for screening mammogram for malignant neoplasm of breast: Secondary | ICD-10-CM

## 2022-04-09 ENCOUNTER — Ambulatory Visit
Admission: RE | Admit: 2022-04-09 | Discharge: 2022-04-09 | Disposition: A | Discharge status: Home / Self Care | Payer: BC Managed Care – PPO | Source: Ambulatory Visit

## 2022-04-09 DIAGNOSIS — Z1231 Encounter for screening mammogram for malignant neoplasm of breast: Secondary | ICD-10-CM

## 2022-04-12 ENCOUNTER — Other Ambulatory Visit: Payer: Self-pay | Admitting: Obstetrics and Gynecology

## 2022-04-12 DIAGNOSIS — R928 Other abnormal and inconclusive findings on diagnostic imaging of breast: Secondary | ICD-10-CM

## 2022-04-20 ENCOUNTER — Ambulatory Visit: Payer: BC Managed Care – PPO

## 2022-04-20 ENCOUNTER — Ambulatory Visit
Admission: RE | Admit: 2022-04-20 | Discharge: 2022-04-20 | Disposition: A | Discharge status: Home / Self Care | Payer: BC Managed Care – PPO | Source: Ambulatory Visit | Attending: Obstetrics and Gynecology | Admitting: Obstetrics and Gynecology

## 2022-04-20 DIAGNOSIS — R928 Other abnormal and inconclusive findings on diagnostic imaging of breast: Secondary | ICD-10-CM

## 2022-04-22 ENCOUNTER — Other Ambulatory Visit: Payer: Self-pay | Admitting: Obstetrics and Gynecology

## 2022-04-22 DIAGNOSIS — R92343 Mammographic extreme density, bilateral breasts: Secondary | ICD-10-CM

## 2022-07-18 ENCOUNTER — Other Ambulatory Visit: Payer: Self-pay | Admitting: Family Medicine

## 2022-07-18 ENCOUNTER — Ambulatory Visit
Admission: RE | Admit: 2022-07-18 | Discharge: 2022-07-18 | Disposition: A | Discharge status: Home / Self Care | Payer: BC Managed Care – PPO | Source: Ambulatory Visit | Attending: Family Medicine | Admitting: Family Medicine

## 2022-07-18 DIAGNOSIS — R059 Cough, unspecified: Secondary | ICD-10-CM

## 2022-07-29 ENCOUNTER — Other Ambulatory Visit: Payer: Self-pay

## 2022-07-29 ENCOUNTER — Emergency Department (HOSPITAL_BASED_OUTPATIENT_CLINIC_OR_DEPARTMENT_OTHER)
Admission: EM | Admit: 2022-07-29 | Discharge: 2022-07-29 | Disposition: A | Discharge status: Home / Self Care | Payer: BC Managed Care – PPO | Attending: Emergency Medicine | Admitting: Emergency Medicine

## 2022-07-29 ENCOUNTER — Emergency Department (HOSPITAL_BASED_OUTPATIENT_CLINIC_OR_DEPARTMENT_OTHER): Payer: BC Managed Care – PPO | Admitting: Radiology

## 2022-07-29 ENCOUNTER — Encounter (HOSPITAL_BASED_OUTPATIENT_CLINIC_OR_DEPARTMENT_OTHER): Payer: Self-pay | Admitting: Emergency Medicine

## 2022-07-29 DIAGNOSIS — J40 Bronchitis, not specified as acute or chronic: Secondary | ICD-10-CM

## 2022-07-29 DIAGNOSIS — R5383 Other fatigue: Secondary | ICD-10-CM

## 2022-07-29 DIAGNOSIS — R531 Weakness: Secondary | ICD-10-CM | POA: Insufficient documentation

## 2022-07-29 DIAGNOSIS — R0602 Shortness of breath: Secondary | ICD-10-CM | POA: Diagnosis present

## 2022-07-29 DIAGNOSIS — R519 Headache, unspecified: Secondary | ICD-10-CM | POA: Diagnosis not present

## 2022-07-29 LAB — CBC
HCT: 41.3 % (ref 36.0–46.0)
Hemoglobin: 14.3 g/dL (ref 12.0–15.0)
MCH: 33.6 pg (ref 26.0–34.0)
MCHC: 34.6 g/dL (ref 30.0–36.0)
MCV: 96.9 fL (ref 80.0–100.0)
Platelets: 289 10*3/uL (ref 150–400)
RBC: 4.26 MIL/uL (ref 3.87–5.11)
RDW: 12.2 % (ref 11.5–15.5)
WBC: 10.6 10*3/uL — ABNORMAL HIGH (ref 4.0–10.5)
nRBC: 0 % (ref 0.0–0.2)

## 2022-07-29 LAB — BASIC METABOLIC PANEL
Anion gap: 11 (ref 5–15)
BUN: 15 mg/dL (ref 6–20)
CO2: 23 mmol/L (ref 22–32)
Calcium: 10 mg/dL (ref 8.9–10.3)
Chloride: 103 mmol/L (ref 98–111)
Creatinine, Ser: 0.73 mg/dL (ref 0.44–1.00)
GFR, Estimated: >60 mL/min (ref >60)
Glucose, Bld: 120 mg/dL — ABNORMAL HIGH (ref 70–99)
Potassium: 3.9 mmol/L (ref 3.5–5.1)
Sodium: 137 mmol/L (ref 135–145)

## 2022-07-29 LAB — MAGNESIUM: Magnesium: 2.1 mg/dL (ref 1.7–2.4)

## 2022-07-29 LAB — TROPONIN I (HIGH SENSITIVITY)
Troponin I (High Sensitivity): <2 ng/L (ref <18)
Troponin I (High Sensitivity): <2 ng/L (ref <18)

## 2022-07-29 LAB — TSH: TSH: 2.378 u[IU]/mL (ref 0.350–4.500)

## 2022-07-29 LAB — D-DIMER, QUANTITATIVE: D-Dimer, Quant: <0.27 ug/mL-FEU (ref 0.00–0.50)

## 2022-07-29 MED ORDER — DEXAMETHASONE SODIUM PHOSPHATE 10 MG/ML IJ SOLN
10.0000 mg | Freq: Once | INTRAMUSCULAR | Status: AC
  Administered 2022-07-29: 10 mg via INTRAVENOUS
  Filled 2022-07-29: qty 1

## 2022-07-29 MED ORDER — PREDNISONE 10 MG PO TABS: 40.0000 mg | ORAL_TABLET | Freq: Every day | ORAL | 0 refills | Status: DC

## 2022-07-29 MED ORDER — METOCLOPRAMIDE HCL 5 MG/ML IJ SOLN
10.0000 mg | Freq: Once | INTRAMUSCULAR | Status: AC
  Administered 2022-07-29: 10 mg via INTRAVENOUS
  Filled 2022-07-29: qty 2

## 2022-07-29 MED ORDER — KETOROLAC TROMETHAMINE 15 MG/ML IJ SOLN
15.0000 mg | Freq: Once | INTRAMUSCULAR | Status: AC
  Administered 2022-07-29: 15 mg via INTRAVENOUS
  Filled 2022-07-29: qty 1

## 2022-07-29 MED ORDER — SODIUM CHLORIDE 0.9 % IV BOLUS
1000.0000 mL | Freq: Once | INTRAVENOUS | Status: AC
  Administered 2022-07-29: 1000 mL via INTRAVENOUS

## 2022-07-29 NOTE — Discharge Instructions (Addendum)
You have been seen today for your complaint of fatigue and cough. Your lab work was reassuring and showed no abnormalities. Your imaging was reassuring and showed no abnormalities. Your discharge medications include prednisone.  This is a steroid.  You should take it as prescribed and for the entire duration of the prescription. Follow up with: Primary care provider in 1 week for reevaluation Please seek immediate medical care if you develop any of the following symptoms: Cough up blood. Feel pain in your chest. Have severe shortness of breath. Faint or keep feeling like you are going to faint. Have a severe headache. Have a fever or chills that get worse. At this time there does not appear to be the presence of an emergent medical condition, however there is always the potential for conditions to change. Please read and follow the below instructions.  Do not take your medicine if  develop an itchy rash, swelling in your mouth or lips, or difficulty breathing; call 911 and seek immediate emergency medical attention if this occurs.  You may review your lab tests and imaging results in their entirety on your MyChart account.  Please discuss all results of fully with your primary care provider and other specialist at your follow-up visit.  Note: Portions of this text may have been transcribed using voice recognition software. Every effort was made to ensure accuracy; however, inadvertent computerized transcription errors may still be present.

## 2022-07-29 NOTE — ED Triage Notes (Signed)
Pt arrives to ED with c/o SOB and Chest pain. She notes that she was recently dx with pneumonia, flu, and bronchitis. Had Nile in 05/2022, which is when she developed SOB abd cough. She was started on Levofloxacin 750mg . She notes chest pain that started yesterday, reports it has shooting. The pain is epigastric and radiates to left chest with left sided shoulder pain.

## 2022-07-29 NOTE — ED Provider Notes (Signed)
[facility] Provider Note   CSN: [id number] Arrival date & time: 07/29/22  1408     History  Chief Complaint  Patient presents with   Shortness of Breath    Cheryl Wright is a 47 y.o. female.  With history of endometriosis, frequent UTIs, headaches who presents to the ED for evaluation of chest pain, shortness of breath, headaches, cough.  She was diagnosed with COVID in December.  States she got did not get better and then was diagnosed with flu and pneumonia and bronchitis.  Was given Tamiflu, an IM dose of Kenalog, albuterol, levofloxacin and over-the-counter medications.  She states she did feel significantly better for 1 day, but then the symptoms did return.  Overall she has had her symptoms for approximately 6 weeks.  Her weakness has gotten worse.  She states she feels like laying in bed most of the day and only gets out of bed to perform essential tasks like showering.  She says she woke up at approximately 4:00 this morning with sharp substernal chest pain with radiation into the left shoulder.  This lasted for short duration and has since resolved.  She is still feeling short of breath.  Her cough has never been productive.  She does take estrogen containing birth control.  She had a uterine ablation done on January 15.  She did not have general anesthesia for this.  She has also been battling headaches and states that she has severe headache at this time.  Denies fevers, chills, urinary symptoms, hemoptysis, unilateral leg swelling, recent travel.   Shortness of Breath Associated symptoms: cough        Home Medications Prior to Admission medications   Medication Sig Start Date End Date Taking? Authorizing Provider  predniSONE (DELTASONE) 10 MG tablet Take 4 tablets (40 mg total) by mouth daily with breakfast. 07/29/22  Yes Arina Torry, [place] Folk, PA-C  diphenhydrAMINE (BENADRYL) 25 MG tablet Take 25 mg by mouth once.    [provider]  HYDROcodone-acetaminophen (NORCO/VICODIN) 5-325 MG per tablet Take 1-2 tablets by mouth every 6 (six) hours as needed. 06/04/12   Bovard-Stuckert, Jeral Fruit, MD  ibuprofen (ADVIL,MOTRIN) 800 MG tablet Take 1 tablet (800 mg total) by mouth every 8 (eight) hours as needed for pain. 06/04/12   Bovard-Stuckert, Jeral Fruit, MD  Prenatal Vit-Fe Fumarate-FA (PRENATAL 19) 29-1 MG CHEW Chew 2 tablets by mouth daily. 06/04/12   Bovard-Stuckert, Jeral Fruit, MD  ranitidine (ZANTAC) 75 MG tablet Take 75-150 mg by mouth daily as needed. For heartburn    [provider]  sertraline (ZOLOFT) 50 MG tablet Take 50 mg by mouth daily.    [provider]  sulfamethoxazole-trimethoprim (SEPTRA DS) 800-160 MG per tablet Take 1 tablet by mouth 2 (two) times daily. 12/11/12   Janne Napoleon, NP      Allergies    Penicillins    Review of Systems   Review of Systems  Respiratory:  Positive for cough and shortness of breath.   All other systems reviewed and are negative.   Physical Exam Updated Vital Signs BP 107/60 (BP Location: Right Arm)   Pulse (!) 57   Temp 97.7 F (36.5 C) (Oral)   Resp 16   Ht 5\' 8"  (1.727 m)   Wt 70.3 kg   LMP 07/08/2022 (Approximate)   SpO2 96%   BMI 23.57 kg/m  Physical Exam Vitals and nursing note reviewed.  Constitutional:      General: She is not in  acute distress.    Appearance: She is well-developed. She is not ill-appearing, toxic-appearing or diaphoretic.     Comments: Resting comfortably in bed  HENT:     Head: Normocephalic and atraumatic.  Eyes:     Conjunctiva/sclera: Conjunctivae normal.  Neck:     Vascular: No JVD.  Cardiovascular:     Rate and Rhythm: Normal rate and regular rhythm.     Heart sounds: No murmur heard. Pulmonary:     Effort: Pulmonary effort is normal. No respiratory distress.     Breath sounds: Normal breath sounds. No decreased breath sounds, wheezing, rhonchi or rales.  Abdominal:     Palpations: Abdomen is soft.      Tenderness: There is no abdominal tenderness.  Musculoskeletal:        General: No swelling.     Cervical back: Neck supple.     Right lower leg: No edema.     Left lower leg: No edema.  Skin:    General: Skin is warm and dry.     Capillary Refill: Capillary refill takes less than 2 seconds.  Neurological:     General: No focal deficit present.     Mental Status: She is alert and oriented to person, place, and time.  Psychiatric:        Mood and Affect: Mood normal.        Behavior: Behavior normal.     ED Results / Procedures / Treatments   Labs (all labs ordered are listed, but only abnormal results are displayed) Labs Reviewed  BASIC METABOLIC PANEL - Abnormal; Notable for the following components:      Result Value   Glucose, Bld 120 (*)    All other components within normal limits  CBC - Abnormal; Notable for the following components:   WBC 10.6 (*)    All other components within normal limits  D-DIMER, QUANTITATIVE  TSH  MAGNESIUM  T4, FREE  TROPONIN I (HIGH SENSITIVITY)  TROPONIN I (HIGH SENSITIVITY)    EKG EKG Interpretation  Date/Time:  Monday July 29 2022 14:22:14 EST Ventricular Rate:  98 PR Interval:  134 QRS Duration: 70 QT Interval:  338 QTC Calculation: 431 R Axis:   60 Text Interpretation: Normal sinus rhythm Normal ECG No previous ECGs available Confirmed by Regan Lemming ([id number]) on 07/29/2022 3:58:05 PM  Radiology DG Chest 2 View  Result Date: 07/29/2022 CLINICAL DATA:  Chest pain, recently diagnosed with pneumonia, flu and bronchitis, had COVID-19 in December 2023 EXAM: CHEST - 2 VIEW COMPARISON:  07/18/2022 FINDINGS: Normal heart size, mediastinal contours, and pulmonary vascularity. Lungs hyperinflated but clear. No pulmonary infiltrate, pleural effusion, or pneumothorax. No acute osseous findings. IMPRESSION: Hyperinflated lungs without acute infiltrate. Electronically Signed   By: Lavonia Dana M.D.   On: 07/29/2022 14:54     Procedures Procedures    Medications Ordered in ED Medications  ketorolac (TORADOL) 15 MG/ML injection 15 mg (15 mg Intravenous Given 07/29/22 2048)  metoCLOPramide (REGLAN) injection 10 mg (10 mg Intravenous Given 07/29/22 2048)  dexamethasone (DECADRON) injection 10 mg (10 mg Intravenous Given 07/29/22 2048)  sodium chloride 0.9 % bolus 1,000 mL (0 mLs Intravenous Stopped 07/29/22 2205)    ED Course/ Medical Decision Making/ A&P Clinical Course as of 07/29/22 2249  Mon Jul 29, 2022  2157 On reevaluation, patient states that her headache has improved [AS]    Clinical Course User Index [AS] Roylene Reason, PA-C  Medical Decision Making Amount and/or Complexity of Data Reviewed Labs: ordered. Radiology: ordered.  Risk Prescription drug management.  This patient presents to the ED for concern of generalized weakness, cough, shortness of breath, this involves an extensive number of treatment options, and is a complaint that carries with it a high risk of complications and morbidity.  The differential diagnosis of weakness includes but is not limited to neurologic causes (GBS, myasthenia gravis, CVA, MS, ALS, transverse myelitis, spinal cord injury, CVA, botulism, ) and other causes: ACS, Arrhythmia, syncope, orthostatic hypotension, sepsis, hypoglycemia, electrolyte disturbance, hypothyroidism, respiratory failure, symptomatic anemia, dehydration, heat injury, polypharmacy, malignancy.   Co morbidities that complicate the patient evaluation  endometriosis, frequent UTIs, headaches  My initial workup includes labs, chest x-ray, headache cocktail  Additional history obtained from: Nursing notes from this visit.  I ordered, reviewed and interpreted labs which include: CBC, BMP, troponin, delta troponin, D-dimer, TSH, magnesium.  Labs within normal limits  I ordered imaging studies including chest x-ray I independently visualized and interpreted  imaging which showed hyperinflated lungs, otherwise normal I agree with the radiologist interpretation  Cardiac Monitoring:  The patient was maintained on a cardiac monitor.  I personally viewed and interpreted the cardiac monitored which showed an underlying rhythm of: NSR  Afebrile, hemodynamically stable.  47 year old female presenting to ED for evaluation of numerous complaints which include fatigue and cough.  Symptoms have been present for the past month.  Patient has a very reassuring physical exam.  Her lab workup was unremarkable.  She was complaining of headache which was treated in the ED successfully.  Low suspicion for ACS or PE.  Patient may have [place].  She did state that she felt better after in injection of steroids.  She will be given a prednisone burst.  She was encouraged to follow-up with her primary care provider for reevaluation.  She was given return precautions.  Stable at discharge.  At this time there does not appear to be any evidence of an acute emergency medical condition and the patient appears stable for discharge with appropriate outpatient follow up. Diagnosis was discussed with patient who verbalizes understanding of care plan and is agreeable to discharge. I have discussed return precautions with patient and husband who verbalizes understanding. Patient encouraged to follow-up with their PCP within 1 week. All questions answered.  Note: Portions of this report may have been transcribed using voice recognition software. Every effort was made to ensure accuracy; however, inadvertent computerized transcription errors may still be present.        Final Clinical Impression(s) / ED Diagnoses Final diagnoses:  Bronchitis  Other fatigue    Rx / DC Orders ED Discharge Orders          Ordered    predniSONE (DELTASONE) 10 MG tablet  Daily with breakfast        07/29/22 2247              Roylene Reason, PA-C 07/29/22 2249    Regan Lemming,  MD 07/30/22 1254

## 2022-07-30 LAB — T4, FREE: Free T4: 0.94 ng/dL (ref 0.61–1.12)

## 2022-08-02 ENCOUNTER — Encounter (HOSPITAL_BASED_OUTPATIENT_CLINIC_OR_DEPARTMENT_OTHER): Payer: Self-pay | Admitting: Pulmonary Disease

## 2022-08-02 ENCOUNTER — Ambulatory Visit (INDEPENDENT_AMBULATORY_CARE_PROVIDER_SITE_OTHER): Payer: BC Managed Care – PPO | Admitting: Pulmonary Disease

## 2022-08-02 VITALS — BP 108/70 | HR 82 | Ht 68.5 in | Wt 158.6 lb

## 2022-08-02 DIAGNOSIS — U099 Post covid-19 condition, unspecified: Secondary | ICD-10-CM | POA: Diagnosis not present

## 2022-08-02 DIAGNOSIS — R918 Other nonspecific abnormal finding of lung field: Secondary | ICD-10-CM | POA: Diagnosis not present

## 2022-08-02 MED ORDER — BUDESONIDE-FORMOTEROL FUMARATE 160-4.5 MCG/ACT IN AERO: 2.0000 | INHALATION_SPRAY | Freq: Two times a day (BID) | RESPIRATORY_TRACT | 3 refills | Status: DC

## 2022-08-02 NOTE — Progress Notes (Signed)
Subjective:   PATIENT ID: Cheryl Wright GENDER: female DOB: 1976/02/12, MRN: [id number]  Chief Complaint  Patient presents with   Consult    2nd time having covid breathing has been right since    Reason for Visit: New consult for recent COVID  Cheryl Wright is a 47 year old female former smoker with endometriosis, migraine, seasonal allergies and GAD who presents for evaluation for respiratory symptoms.  PCP note with Dr. Brigitte Pulse on 07/18/22 presented with cough and dyspnea. She has had covid in September 2023 and again in May 29, 2022. Her covid symptoms were improving until end of December when she began having more productive cough and left pleuritic chest pain. She was treated with Septra and albuterol inhaler 07/10/22 and improved cough but has worsening shortness of breath and congestion. Tested positive for flu B on 07/18/22. Stopped taking Septra after taking 7 days. Took tamiflu and completed it. And took levaquin when CXR showed possible pneumonia  She had a CT Chest on 07/30/22 with impression: 1. Reticulonodular and tree in bud type opacities in right middle lobe suspected be infectious or inflammatory in etiology. Consider atypical etiologies including MAI. 2. Several small lung nodules as detailed, 5 mm and less. No follow-up needed if patient is low risk. CXR report on 07/18/21 with interstitial changes at the bases however resolved on follow-up CXR 07/29/22.  She reports her nasal congestion and productive cough that worsened in the beginning of January. Went on a ski trip and felt sick during the trip. Worsening shortness of breath. After finishing her antibiotics she is laying in bed and feeling fatigued. She was evaluated in ED on 2/5. Treated with IV fluids, steroids. Has completed steroids but she feels it was not effective.  At baseline she reports she does have wheezing and cough when exposed to outdoor allergens. Uses saline nasal solution. Associated with chest  tightness. Now she feels like she has chronic symptoms. Denies childhood asthma. Albuterol initially helped but does not feel like albuterol inhaler anymore due to head spinning. Cough is less productive. Has nasal congestion. Has wheezing/whistle with laying down.  Social History: Social smoker <10 years weekly. <1/2 ppd. Quit in 2202 Second hand smoke exposure  I have personally reviewed patient's past medical/family/social history, allergies, current medications.  Past Medical History:  Diagnosis Date   Blood transfusion without reported diagnosis    pp hemorrhage 2011 twin delivery   Endometriosis    Frequent UTI    Headache(784.0)    migraine   Hx of preeclampsia, prior pregnancy, currently pregnant    Normal pregnancy 06/01/2012   SVD (spontaneous vaginal delivery) 06/02/2012     Family History  Problem Relation Age of Onset   Hyperlipidemia Mother    Hypertension Mother    Rheum arthritis Father    Lung cancer Father        mesothelioma from [place]   Breast cancer Paternal Grandmother 51   Colon cancer Paternal Grandmother 46   Lung cancer Paternal Grandfather        dx in his 41s; smoker   Stroke Maternal Grandmother    Ovarian cancer Other        paternal grandfather's 2 sisters and mother in their 56s-60s   Breast cancer Other        father's paternal first cousin dx in her 69s   Breast cancer Other        paternal grandmother's mother dx in her 67s  Social History   Occupational History   Not on file  Tobacco Use   Smoking status: Former    Years: 2.00    Types: Cigarettes   Smokeless tobacco: Never  Substance and Sexual Activity   Alcohol use: Yes    Comment: 3-4/week   Drug use: No   Sexual activity: Yes    Comment: preg    Allergies  Allergen Reactions   Penicillins Anaphylaxis, Hives and Swelling    Childhood reaction     Outpatient Medications Prior to Visit  Medication Sig Dispense Refill   albuterol (VENTOLIN HFA) 108 (90 Base)  MCG/ACT inhaler SMARTSIG:1 Puff(s) Via Inhaler Every 4 Hours PRN     ibuprofen (ADVIL,MOTRIN) 800 MG tablet Take 1 tablet (800 mg total) by mouth every 8 (eight) hours as needed for pain. 40 tablet 1   sertraline (ZOLOFT) 50 MG tablet Take 50 mg by mouth daily.     diphenhydrAMINE (BENADRYL) 25 MG tablet Take 25 mg by mouth once. (Patient not taking: Reported on 08/02/2022)     HYDROcodone-acetaminophen (NORCO/VICODIN) 5-325 MG per tablet Take 1-2 tablets by mouth every 6 (six) hours as needed. (Patient not taking: Reported on 08/02/2022) 15 tablet 0   predniSONE (DELTASONE) 10 MG tablet Take 4 tablets (40 mg total) by mouth daily with breakfast. (Patient not taking: Reported on 08/02/2022) 16 tablet 0   Prenatal Vit-Fe Fumarate-FA (PRENATAL 19) 29-1 MG CHEW Chew 2 tablets by mouth daily. (Patient not taking: Reported on 08/02/2022) 60 tablet 12   ranitidine (ZANTAC) 75 MG tablet Take 75-150 mg by mouth daily as needed. For heartburn (Patient not taking: Reported on 08/02/2022)     sulfamethoxazole-trimethoprim (SEPTRA DS) 800-160 MG per tablet Take 1 tablet by mouth 2 (two) times daily. (Patient not taking: Reported on 08/02/2022) 10 tablet 0   No facility-administered medications prior to visit.    Review of Systems  Constitutional:  Negative for chills, diaphoresis, fever, malaise/fatigue and weight loss.  HENT:  Negative for congestion.   Respiratory:  Positive for shortness of breath. Negative for cough, hemoptysis, sputum production and wheezing.   Cardiovascular:  Negative for chest pain, palpitations and leg swelling.     Objective:   Vitals:   08/02/22 1308  BP: 108/70  Pulse: 82  SpO2: 98%  Weight: 158 lb 9.6 oz (71.9 kg)  Height: 5' 8.5" (1.74 m)   SpO2: 98 %  Physical Exam: General: Well-appearing, no acute distress HENT: [place], AT Eyes: EOMI, no scleral icterus Respiratory: Clear to auscultation bilaterally.  No crackles, wheezing or rales Cardiovascular: RRR, -M/R/G, no  JVD Extremities:-Edema,-tenderness Neuro: AAO x4, CNII-XII grossly intact Psych: Normal mood, normal affect  Data Reviewed:  Imaging: CT Chest 07/30/22 1. Reticulonodular and tree-in-bud type opacities noted in the right  middle lobe suspected to be infectious or inflammatory in etiology.  Consider atypical etiologies including MAI.  2. Several small lung nodules as detailed, 5 mm and less. No  follow-up needed if patient is low-risk (and has no known or  suspected primary neoplasm).   PFT: None on file  Labs: CBC    Component Value Date/Time   WBC 10.6 (H) 07/29/2022 1425   RBC 4.26 07/29/2022 1425   HGB 14.3 07/29/2022 1425   HCT 41.3 07/29/2022 1425   PLT 289 07/29/2022 1425   MCV 96.9 07/29/2022 1425   MCH 33.6 07/29/2022 1425   MCHC 34.6 07/29/2022 1425   RDW 12.2 07/29/2022 1425   Abs eos 07/18/22 100  Assessment & Plan:   Discussion: 47 year old female former smoker with endometriosis, migraine, seasonal allergies and GAD who presents for evaluation for respiratory symptoms. We reviewed clinical course of COVID-19 including long-term complications including post-inflammatory lung disease and long hauler symptoms.   MAI/Atypical infection on CT Clinically low suspicion for this --Consider repeat scan if symptoms persistent around 3-6 months  --Patient will obtain CD for our records  Post-covid long hauler  Shortness of breath and wheezing --ORDER pulmonary function tests to evaluate restrictive or obstructive defect --START Symbicort 160-4.5 mcg TWO puffs in the morning and evening. Rinse mouth out after use to prevent thrush --REFER to pulmonary rehab  Health Maintenance Immunization History  Administered Date(s) Administered   PFIZER(Purple Top)SARS-COV-2 Vaccination 09/10/2019, 10/05/2019, 05/25/2020   Pfizer Covid-19 Vaccine Bivalent Booster 19yr & up 07/14/2021   CT Lung Screen - not qualifed  Orders Placed This Encounter  Procedures   AMB  referral to pulmonary rehabilitation    Referral Priority:   Routine    Referral Type:   Consultation    Number of Visits Requested:   1   Pulmonary function test    Standing Status:   Future    Standing Expiration Date:   08/03/2023    Order Specific Question:   Where should this test be performed?    Answer:   [facility]    Order Specific Question:   Full PFT: includes the following: basic spirometry, spirometry pre & post bronchodilator, diffusion capacity (DLCO), lung volumes    Answer:   Full PFT   Meds ordered this encounter  Medications   budesonide-formoterol (SYMBICORT) 160-4.5 MCG/ACT inhaler    Sig: Inhale 2 puffs into the lungs in the morning and at bedtime.    Dispense:  1 each    Refill:  3    No follow-ups on file. After PFTs in April  I have spent a total time of 45-minutes on the day of the appointment reviewing prior documentation, coordinating care and discussing medical diagnosis and plan with the patient/family. Imaging, labs and tests included in this note have been reviewed and interpreted independently by me.  [place] MD [facility] 08/02/2022 2:56 PM  Office Number [phone]

## 2022-08-02 NOTE — Patient Instructions (Addendum)
MAI/Atypical infection on CT Clinically low suspicion for this --Consider repeat scan if symptoms persistent around 3-6 months  --Patient will obtain CD for our records  Post-covid long hauler  Shortness of breath and wheezing --ORDER pulmonary function tests to evaluate restrictive or obstructive defect --START Symbicort 160-4.5 mcg TWO puffs in the morning and evening. Rinse mouth out after use to prevent thrush --REFER to pulmonary rehab  Follow-up with me after PFTs in April

## 2022-08-05 ENCOUNTER — Encounter (HOSPITAL_BASED_OUTPATIENT_CLINIC_OR_DEPARTMENT_OTHER): Payer: Self-pay | Admitting: Pulmonary Disease

## 2022-08-05 DIAGNOSIS — M25562 Pain in left knee: Secondary | ICD-10-CM

## 2022-08-06 NOTE — Telephone Encounter (Signed)
Mychart message sent by pt:  Cheryl Wright [facility] Clinical Pool (supporting [place], MD)Yesterday (12:30 PM)    I'm need Dr Loanne Drilling to tell me if my symptoms are to be expected. I still have chest pain when breathing and extreme fatigue with lightheadedness and dizziness. On Saturday I started experiencing joint pain in my knees, ankle, and hips. Im trying to understand if this is expected for the next few weeks or months or should I seek additional care.  Im also wondering if I can continue to use my albutorol inhaler as needed every four hours. My breathing is still labored and I struggle to catch my breath.    Dr Loanne Drilling asked [place] if we lived [place] a farm and we realized that we weren't clear about where we live. Our housing developing [place]. [place] than [place] mile from [place] and we have farm across the road from [place]    Thank you.     Dr. Loanne Drilling, please advise.

## 2022-08-06 NOTE — Telephone Encounter (Signed)
She continues to have profound weakness and fatigued. Sometimes assoicated with lightheadedness. Shortness of breath continues to be difficult. Symbicort does help some but overall her symptoms remain overwhelming. Has not used albuterol inhaler.  Assessment/Plan COVID-19 long hauler  We discussed COVID long hauler and deconditioning associated with her recent illnesses. Encouraged to continue Symbicort and use albuterol as needed.  Patient will bring CD to office for review. We discussed if findings persistent, we could pursue  bronchoscopy but will table this plan until then.

## 2022-08-07 ENCOUNTER — Telehealth (HOSPITAL_COMMUNITY): Payer: Self-pay

## 2022-08-07 NOTE — Telephone Encounter (Signed)
Pt insurance is active and benefits verified through [employer] Co-pay 0, DED $1,250/$1,250 met, out of pocket $4,890/$1,973.84 met, co-insurance 20%. no pre-authorization required, [place]/[employer] 08/06/2022@3$ :22pm, REF# [id number]

## 2022-08-15 ENCOUNTER — Encounter (HOSPITAL_BASED_OUTPATIENT_CLINIC_OR_DEPARTMENT_OTHER): Payer: Self-pay | Admitting: Pulmonary Disease

## 2022-08-15 NOTE — Telephone Encounter (Signed)
Please advise 

## 2022-08-16 MED ORDER — METHYLPREDNISOLONE 4 MG PO TBPK: ORAL_TABLET | ORAL | 0 refills | Status: DC

## 2022-08-16 NOTE — Telephone Encounter (Signed)
In the last 2-3 days, she reports worsening shortness of breath worse at night with nighttime coughing. Associated chest tightness. Wheezing during the day and night. No recent sick contacts.   Asthma Exacerbation Methylprednisone pack ordered

## 2022-08-20 ENCOUNTER — Encounter (HOSPITAL_BASED_OUTPATIENT_CLINIC_OR_DEPARTMENT_OTHER): Payer: Self-pay | Admitting: Pulmonary Disease

## 2022-08-20 DIAGNOSIS — U099 Post covid-19 condition, unspecified: Secondary | ICD-10-CM

## 2022-08-21 NOTE — Telephone Encounter (Signed)
Mychart message sent by pt:  [place] [facility] (supporting Chi Rodman Pickle, MD)21 hours ago (2:19 PM)    I'm following up on the latest course of steroids Dr Loanne Drilling prescribed. I was struggling to breathe last week. I'm on the 5th day of this script. I'm still struggling to catch my breath and working hard to keep my stats about 94-95. Today I've used the albuterol inhaler 3 times (in addition to my daily dose of simbicort). I'm wondering if Dr Loanne Drilling can tell me if this is to be expected or if I should be finding it easier to breathe. I'm also still struggling with chest pain, light-headedness, and fatigue.      Dr. Loanne Drilling, please advise.

## 2022-08-22 MED ORDER — METHYLPREDNISOLONE 4 MG PO TBPK: ORAL_TABLET | ORAL | 0 refills | Status: DC

## 2022-08-22 MED ORDER — ALBUTEROL SULFATE (2.5 MG/3ML) 0.083% IN NEBU: 2.5000 mg | INHALATION_SOLUTION | Freq: Four times a day (QID) | RESPIRATORY_TRACT | 2 refills | Status: AC | PRN

## 2022-08-22 NOTE — Telephone Encounter (Signed)
Please order patient a nebulizer.  Albuterol nebs prescription sent in  Reviewed patients history. Recurrent shortness of breath, wheezing and coughing attacks at night. She is near the end of her steroid taper.   Assessment Covid long hauler Asthma exacerbation, post-viral wheezing  Plan Continue Symbicort BID Continue albuterol HFA q4h PRN Complete pred taper. Re-ordered taper if symptoms (specifically wheezing) recur off steroids Order nebulizer  Reassured patient this is not the presentation for MAC Chest pain is likely costochondritis. Advised PRN OTC. If this persists advised evaluation in urgent care

## 2022-08-27 ENCOUNTER — Telehealth (HOSPITAL_COMMUNITY): Payer: Self-pay

## 2022-08-27 NOTE — Telephone Encounter (Signed)
Called patient to see if she was interested in participating in the Pulmonary Rehab Program. Patient stated yes. Patient will come in for orientation on 08/30/22'@10'$ :30 and will attend the 10:15 exercise class.   Sent information via [employer]

## 2022-08-29 ENCOUNTER — Telehealth (HOSPITAL_COMMUNITY): Payer: Self-pay

## 2022-08-29 NOTE — Telephone Encounter (Signed)
Pt called to confirm PR orientation for tomorrow. Pt states she will be there.

## 2022-08-30 ENCOUNTER — Encounter (HOSPITAL_COMMUNITY): Payer: Self-pay

## 2022-08-30 ENCOUNTER — Encounter (HOSPITAL_COMMUNITY)
Admission: RE | Admit: 2022-08-30 | Discharge: 2022-08-30 | Disposition: A | Discharge status: Home / Self Care | Payer: BC Managed Care – PPO | Source: Ambulatory Visit | Attending: Pulmonary Disease | Admitting: Pulmonary Disease

## 2022-08-30 VITALS — BP 98/72 | HR 102 | Ht 68.0 in | Wt 160.5 lb

## 2022-08-30 DIAGNOSIS — U099 Post covid-19 condition, unspecified: Secondary | ICD-10-CM | POA: Insufficient documentation

## 2022-08-30 DIAGNOSIS — R0602 Shortness of breath: Secondary | ICD-10-CM | POA: Insufficient documentation

## 2022-08-30 DIAGNOSIS — Z5189 Encounter for other specified aftercare: Secondary | ICD-10-CM | POA: Insufficient documentation

## 2022-08-30 NOTE — Progress Notes (Signed)
Cheryl Wright 47 y.o. female Pulmonary Rehab Orientation Note This patient who was referred to Pulmonary Rehab by Dr. Loanne Drilling with the diagnosis of SOB and COVID 55 long hauler arrived today in Cardiac and Pulmonary Rehab. She  arrived ambulatory with normal gait. She  does not carry portable oxygen. Per patient, Cheryl Wright uses oxygen never. Color good, skin warm and dry. Patient is oriented to time and place. Patient's medical history, psychosocial health, and medications reviewed. Psychosocial assessment reveals patient lives with spouse. 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 Patient hobbies include  yoga, running, hikes, skiing, paddle boarding, hiking ,and crafting. Patient reports her stress level is low. Areas of stress/anxiety include health. Patient does not exhibit signs of depression. Signs of depression include PHQ2/9 score 0/0. Cheryl Wright shows good  coping skills with positive outlook on life. Offered emotional support and reassurance. Will continue to monitor. Physical assessment performed by Nurse pick: Janine Ores RN. Please see their orientation physical assessment note. Cheryl Wright reports she does take medications as prescribed. Patient states she follows a regular  diet. The patient reports no specific efforts to gain or lose weight.. Patient's weight will be monitored closely. Demonstration and practice of PLB using pulse oximeter. Cheryl Wright able to return demonstration satisfactorily. Safety and hand hygiene in the exercise area reviewed with patient. Cheryl Wright voices understanding of the information reviewed. Department expectations discussed with patient and achievable goals were set. The patient shows enthusiasm about attending the program and we look forward to working with Cheryl Wright. Cheryl Wright completed a 6 min walk test today and is scheduled to begin exercise on 09/05/22 @ 10:15.   [id number] Cheryl Wright, BSRT

## 2022-08-30 NOTE — Progress Notes (Signed)
Pulmonary Individual Treatment Plan  Patient Details  Name: Cheryl Wright MRN: [id number] Date of Birth: [birth year] Referring Provider:    Initial Encounter Date:   Visit Diagnosis: Shortness of breath  COVID-19 long hauler  Patient's Home Medications on Admission:   Current Outpatient Medications:    albuterol (PROVENTIL) (2.5 MG/3ML) 0.083% nebulizer solution, Take 3 mLs (2.5 mg total) by nebulization every 6 (six) hours as needed for wheezing or shortness of breath., Disp: 75 mL, Rfl: 2   albuterol (VENTOLIN HFA) 108 (90 Base) MCG/ACT inhaler, SMARTSIG:1 Puff(s) Via Inhaler Every 4 Hours PRN, Disp: , Rfl:    budesonide-formoterol (SYMBICORT) 160-4.5 MCG/ACT inhaler, Inhale 2 puffs into the lungs in the morning and at bedtime., Disp: 1 each, Rfl: 3   fluticasone (FLONASE) 50 MCG/ACT nasal spray, Place 1 spray into both nostrils daily., Disp: , Rfl:    ibuprofen (ADVIL,MOTRIN) 800 MG tablet, Take 1 tablet (800 mg total) by mouth every 8 (eight) hours as needed for pain., Disp: 40 tablet, Rfl: 1   loratadine (CLARITIN) 10 MG tablet, Take 10 mg by mouth daily., Disp: , Rfl:    norgestimate-ethinyl estradiol (ORTHO-CYCLEN) 0.25-35 MG-MCG tablet, Take 1 tablet by mouth daily., Disp: , Rfl:    sertraline (ZOLOFT) 50 MG tablet, Take 50 mg by mouth daily., Disp: , Rfl:    methylPREDNISolone (MEDROL DOSEPAK) 4 MG TBPK tablet, Take 4 tablets x 2 days, 3 tablets x 2 days, 2 tablets  x 2 days, 1 tablet x 3 days (Patient not taking: Reported on 08/30/2022), Disp: 21 each, Rfl: 0   Prenatal Vit-Fe Fumarate-FA (PRENATAL 19) 29-1 MG CHEW, Chew 2 tablets by mouth daily. (Patient not taking: Reported on 08/02/2022), Disp: 60 tablet, Rfl: 12  Past Medical History: Past Medical History:  Diagnosis Date   Blood transfusion without reported diagnosis    pp hemorrhage 2011 twin delivery   Endometriosis    Frequent UTI    Headache(784.0)    migraine   Hx of preeclampsia, prior pregnancy, currently  pregnant    Normal pregnancy 06/01/2012   SVD (spontaneous vaginal delivery) 06/02/2012    Tobacco Use: Social History   Tobacco Use  Smoking Status Former   Years: 2.00   Types: Cigarettes  Smokeless Tobacco Never    Labs: Review Flowsheet        No data to display          Capillary Blood Glucose: No results found for: "GLUCAP"   Pulmonary Assessment Scores:  Pulmonary Assessment Scores     Row Name 08/30/22 1109         ADL UCSD   ADL Phase Entry     SOB Score total 86       CAT Score   CAT Score 31             UCSD: Self-administered rating of dyspnea associated with activities of daily living (ADLs) 6-point scale (0 = "not at all" to 5 = "maximal or unable to do because of breathlessness")  Scoring Scores range from 0 to 120.  Minimally important difference is 5 units  CAT: CAT can identify the health impairment of COPD patients and is better correlated with disease progression.  CAT has a scoring range of zero to 40. The CAT score is classified into four groups of low (less than 10), medium (10 - 20), high (21-30) and very high (31-40) based on the impact level of disease on health status. A CAT score over 10 suggests significant  symptoms.  A worsening CAT score could be explained by an exacerbation, poor medication adherence, poor inhaler technique, or progression of COPD or comorbid conditions.  CAT MCID is 2 points  mMRC: mMRC (Modified Medical Research Council) Dyspnea Scale is used to assess the degree of baseline functional disability in patients of respiratory disease due to dyspnea. No minimal important difference is established. A decrease in score of 1 point or greater is considered a positive change.   Pulmonary Function Assessment:   Exercise Target Goals: Exercise Program Goal: Individual exercise prescription set using results from initial 6 min walk test and THRR while considering  patient's activity barriers and safety.    Exercise Prescription Goal: Initial exercise prescription builds to 30-45 minutes a day of aerobic activity, 2-3 days per week.  Home exercise guidelines will be given to patient during program as part of exercise prescription that the participant will acknowledge.  Activity Barriers & Risk Stratification:  Activity Barriers & Cardiac Risk Stratification - 08/30/22 1050       Activity Barriers & Cardiac Risk Stratification   Activity Barriers Shortness of Breath;Deconditioning;Muscular Weakness;Joint Problems             6 Minute Walk:  6 Minute Walk     Row Name 08/30/22 1138         6 Minute Walk   Phase Initial     Distance 1080 feet     Walk Time 6 minutes     # of Rest Breaks 0     MPH 2.05     METS 3.85     RPE 12     Perceived Dyspnea  1.5     VO2 Peak 13.48     Symptoms No     Resting HR 102 bpm     Resting BP 98/72     Resting Oxygen Saturation  97 %     Exercise Oxygen Saturation  during 6 min walk 95 %     Max Ex. HR 105 bpm     Max Ex. BP 102/66     2 Minute Post BP 100/62       Interval HR   1 Minute HR 102     2 Minute HR 101     3 Minute HR 100     4 Minute HR 102     5 Minute HR 102     6 Minute HR 105     2 Minute Post HR 88     Interval Heart Rate? Yes       Interval Oxygen   Interval Oxygen? Yes     Baseline Oxygen Saturation % 97 %     1 Minute Oxygen Saturation % 96 %     1 Minute Liters of Oxygen 0 L     2 Minute Oxygen Saturation % 95 %     2 Minute Liters of Oxygen 0 L     3 Minute Oxygen Saturation % 95 %     3 Minute Liters of Oxygen 0 L     4 Minute Oxygen Saturation % 95 %     4 Minute Liters of Oxygen 0 L     5 Minute Oxygen Saturation % 96 %     5 Minute Liters of Oxygen 0 L     6 Minute Oxygen Saturation % 95 %     6 Minute Liters of Oxygen 0 L     2 Minute Post Oxygen Saturation % 96 %  2 Minute Post Liters of Oxygen 0 L              Oxygen Initial Assessment:  Oxygen Initial Assessment - 08/30/22 1053        Home Oxygen   Home Oxygen Device None    Sleep Oxygen Prescription None    Home Exercise Oxygen Prescription None    Home Resting Oxygen Prescription None      Initial 6 min Walk   Oxygen Used None      Program Oxygen Prescription   Program Oxygen Prescription None      Intervention   Short Term Goals To learn and understand importance of maintaining oxygen saturations>88%;To learn and demonstrate proper use of respiratory medications;To learn and understand importance of monitoring SPO2 with pulse oximeter and demonstrate accurate use of the pulse oximeter.;To learn and demonstrate proper pursed lip breathing techniques or other breathing techniques.     Long  Term Goals Maintenance of O2 saturations>88%;Compliance with respiratory medication;Verbalizes importance of monitoring SPO2 with pulse oximeter and return demonstration;Exhibits proper breathing techniques, such as pursed lip breathing or other method taught during program session;Demonstrates proper use of MDI's             Oxygen Re-Evaluation:   Oxygen Discharge (Final Oxygen Re-Evaluation):   Initial Exercise Prescription:   Perform Capillary Blood Glucose checks as needed.  Exercise Prescription Changes:   Exercise Comments:   Exercise Goals and Review:   Exercise Goals     Row Name 08/30/22 1052             Exercise Goals   Increase Physical Activity Yes       Intervention Provide advice, education, support and counseling about physical activity/exercise needs.;Develop an individualized exercise prescription for aerobic and resistive training based on initial evaluation findings, risk stratification, comorbidities and participant's personal goals.       Expected Outcomes Short Term: Attend rehab on a regular basis to increase amount of physical activity.;Long Term: Exercising regularly at least 3-5 days a week.;Long Term: Add in home exercise to make exercise part of routine and to increase  amount of physical activity.       Increase Strength and Stamina Yes       Intervention Provide advice, education, support and counseling about physical activity/exercise needs.;Develop an individualized exercise prescription for aerobic and resistive training based on initial evaluation findings, risk stratification, comorbidities and participant's personal goals.       Expected Outcomes Short Term: Increase workloads from initial exercise prescription for resistance, speed, and METs.;Short Term: Perform resistance training exercises routinely during rehab and add in resistance training at home;Long Term: Improve cardiorespiratory fitness, muscular endurance and strength as measured by increased METs and functional capacity (6MWT)       Able to understand and use rate of perceived exertion (RPE) scale Yes       Intervention Provide education and explanation on how to use RPE scale       Expected Outcomes Short Term: Able to use RPE daily in rehab to express subjective intensity level;Long Term:  Able to use RPE to guide intensity level when exercising independently       Able to understand and use Dyspnea scale Yes       Intervention Provide education and explanation on how to use Dyspnea scale       Expected Outcomes Short Term: Able to use Dyspnea scale daily in rehab to express subjective sense of shortness of breath during exertion;Long Term:  Able to use Dyspnea scale to guide intensity level when exercising independently       Knowledge and understanding of Target Heart Rate Range (THRR) Yes       Intervention Provide education and explanation of THRR including how the numbers were predicted and where they are located for reference       Expected Outcomes Short Term: Able to state/look up THRR;Long Term: Able to use THRR to govern intensity when exercising independently;Short Term: Able to use daily as guideline for intensity in rehab       Able to check pulse independently --       Intervention  --       Expected Outcomes --       Understanding of Exercise Prescription Yes       Intervention Provide education, explanation, and written materials on patient's individual exercise prescription       Expected Outcomes Short Term: Able to explain program exercise prescription;Long Term: Able to explain home exercise prescription to exercise independently                Exercise Goals Re-Evaluation :   Discharge Exercise Prescription (Final Exercise Prescription Changes):   Nutrition:  Target Goals: Understanding of nutrition guidelines, daily intake of sodium '1500mg'$ , cholesterol '200mg'$ , calories 30% from fat and 7% or less from saturated fats, daily to have 5 or more servings of fruits and vegetables.  Biometrics:  Pre Biometrics - 08/30/22 1035       Pre Biometrics   Grip Strength 18 kg              Nutrition Therapy Plan and Nutrition Goals:   Nutrition Assessments:  MEDIFICTS Score Key: ?70 Need to make dietary changes  40-70 Heart Healthy Diet ? 40 Therapeutic Level Cholesterol Diet   Picture Your Plate Scores: D34-534 Unhealthy dietary pattern with much room for improvement. 41-50 Dietary pattern unlikely to meet recommendations for good health and room for improvement. 51-60 More healthful dietary pattern, with some room for improvement.  >60 Healthy dietary pattern, although there may be some specific behaviors that could be improved.    Nutrition Goals Re-Evaluation:   Nutrition Goals Discharge (Final Nutrition Goals Re-Evaluation):   Psychosocial: Target Goals: Acknowledge presence or absence of significant depression and/or stress, maximize coping skills, provide positive support system. Participant is able to verbalize types and ability to use techniques and skills needed for reducing stress and depression.  Initial Review & Psychosocial Screening:  Initial Psych Review & Screening - 08/30/22 1047       Initial Review   Current issues with  Current Anxiety/Panic      Family Dynamics   Good Support System? Yes    Comments anxiety managed with meds      Barriers   Psychosocial barriers to participate in program There are no identifiable barriers or psychosocial needs.      Screening Interventions   Interventions Encouraged to exercise             Quality of Life Scores:  Scores of 19 and below usually indicate a poorer quality of life in these areas.  A difference of  2-3 points is a clinically meaningful difference.  A difference of 2-3 points in the total score of the Quality of Life Index has been associated with significant improvement in overall quality of life, self-image, physical symptoms, and general health in studies assessing change in quality of life.  PHQ-9: Review Flowsheet  08/30/2022  Depression screen PHQ 2/9  Decreased Interest 0  Down, Depressed, Hopeless 0  PHQ - 2 Score 0  Altered sleeping 0  Tired, decreased energy 0  Change in appetite 0  Feeling bad or failure about yourself  0  Trouble concentrating 0  Moving slowly or fidgety/restless 0  Suicidal thoughts 0  PHQ-9 Score 0  Difficult doing work/chores Somewhat difficult   Interpretation of Total Score  Total Score Depression Severity:  1-4 = Minimal depression, 5-9 = Mild depression, 10-14 = Moderate depression, 15-19 = Moderately severe depression, 20-27 = Severe depression   Psychosocial Evaluation and Intervention:  Psychosocial Evaluation - 08/30/22 1205       Psychosocial Evaluation & Interventions   Interventions Encouraged to exercise with the program and follow exercise prescription    Comments Pt denies any psychosocial or barriers concerns    Expected Outcomes Pt will partcipate in PR without any psychosocial barriers or concerns    Continue Psychosocial Services  No Follow up required             Psychosocial Re-Evaluation:   Psychosocial Discharge (Final Psychosocial  Re-Evaluation):   Education: Education Goals: Education classes will be provided on a weekly basis, covering required topics. Participant will state understanding/return demonstration of topics presented.  Learning Barriers/Preferences:  Learning Barriers/Preferences - 08/30/22 1049       Learning Barriers/Preferences   Learning Barriers None    Learning Preferences None             Education Topics: Introduction to Pulmonary Rehab Group instruction provided by PowerPoint, verbal discussion, and written material to support subject matter. Instructor reviews what Pulmonary Rehab is, the purpose of the program, and how patients are referred.     Know Your Numbers Group instruction that is supported by a PowerPoint presentation. Instructor discusses importance of knowing and understanding resting, exercise, and post-exercise oxygen saturation, heart rate, and blood pressure. Oxygen saturation, heart rate, blood pressure, rating of perceived exertion, and dyspnea are reviewed along with a normal range for these values.    Exercise for the Pulmonary Patient Group instruction that is supported by a PowerPoint presentation. Instructor discusses benefits of exercise, core components of exercise, frequency, duration, and intensity of an exercise routine, importance of utilizing pulse oximetry during exercise, safety while exercising, and options of places to exercise outside of rehab.       MET Level  Group instruction provided by PowerPoint, verbal discussion, and written material to support subject matter. Instructor reviews what METs are and how to increase METs.    Pulmonary Medications Verbally interactive group education provided by instructor with focus on inhaled medications and proper administration.   Anatomy and Physiology of the Respiratory System Group instruction provided by PowerPoint, verbal discussion, and written material to support subject matter. Instructor  reviews respiratory cycle and anatomical components of the respiratory system and their functions. Instructor also reviews differences in obstructive and restrictive respiratory diseases with examples of each.    Oxygen Safety Group instruction provided by PowerPoint, verbal discussion, and written material to support subject matter. There is an overview of "What is Oxygen" and "Why do we need it".  Instructor also reviews how to create a safe environment for oxygen use, the importance of using oxygen as prescribed, and the risks of noncompliance. There is a brief discussion on traveling with oxygen and resources the patient may utilize.   Oxygen Use Group instruction provided by PowerPoint, verbal discussion, and written material to discuss  how supplemental oxygen is prescribed and different types of oxygen supply systems. Resources for more information are provided.    Breathing Techniques Group instruction that is supported by demonstration and informational handouts. Instructor discusses the benefits of pursed lip and diaphragmatic breathing and detailed demonstration on how to perform both.     Risk Factor Reduction Group instruction that is supported by a PowerPoint presentation. Instructor discusses the definition of a risk factor, different risk factors for pulmonary disease, and how the heart and lungs work together.   MD Day A group question and answer session with a medical doctor that allows participants to ask questions that relate to their pulmonary disease state.   Nutrition for the Pulmonary Patient Group instruction provided by PowerPoint slides, verbal discussion, and written materials to support subject matter. The instructor gives an explanation and review of healthy diet recommendations, which includes a discussion on weight management, recommendations for fruit and vegetable consumption, as well as protein, fluid, caffeine, fiber, sodium, sugar, and alcohol. Tips for  eating when patients are short of breath are discussed.    Other Education Group or individual verbal, written, or video instructions that support the educational goals of the pulmonary rehab program.    Knowledge Questionnaire Score:  Knowledge Questionnaire Score - 08/30/22 1112       Knowledge Questionnaire Score   Pre Score 16/18             Core Components/Risk Factors/Patient Goals at Admission:  Personal Goals and Risk Factors at Admission - 08/30/22 1050       Core Components/Risk Factors/Patient Goals on Admission    Weight Management Weight Maintenance    Improve shortness of breath with ADL's Yes    Intervention Provide education, individualized exercise plan and daily activity instruction to help decrease symptoms of SOB with activities of daily living.    Expected Outcomes Short Term: Improve cardiorespiratory fitness to achieve a reduction of symptoms when performing ADLs    Increase knowledge of respiratory medications and ability to use respiratory devices properly  Yes    Intervention Provide education and demonstration as needed of appropriate use of medications, inhalers, and oxygen therapy.    Expected Outcomes Short Term: Achieves understanding of medications use. Understands that oxygen is a medication prescribed by physician. Demonstrates appropriate use of inhaler and oxygen therapy.;Long Term: Maintain appropriate use of medications, inhalers, and oxygen therapy.             Core Components/Risk Factors/Patient Goals Review:    Core Components/Risk Factors/Patient Goals at Discharge (Final Review):    ITP Comments:   Comments: Dr. Rodman Pickle is Medical Director for Pulmonary Rehab at [facility].

## 2022-08-30 NOTE — Progress Notes (Signed)
Pulmonary Rehab Orientation Physical Assessment Note  Physical assessment reveals patient is alert and oriented x 4.  Heart rate is normal, breath sounds clear to auscultation, no wheezes, rales, or rhonchi. Pt endorses a dry chronic cough. Bowel sounds present x4 quads.  Pt denies abdominal discomfort, vomiting or diarrhea but endorses chronic nausea. Grip strength equal, strong. Distal pulses +2; no swelling to lower extremities.

## 2022-09-04 NOTE — Progress Notes (Signed)
Pulmonary Individual Treatment Plan  Patient Details  Name: Cheryl Wright MRN: [id number] Date of Birth: [birth year] Referring Provider:    Initial Encounter Date:   Visit Diagnosis: Shortness of breath  COVID-19 long hauler  Patient's Home Medications on Admission:   Current Outpatient Medications:    albuterol (PROVENTIL) (2.5 MG/3ML) 0.083% nebulizer solution, Take 3 mLs (2.5 mg total) by nebulization every 6 (six) hours as needed for wheezing or shortness of breath., Disp: 75 mL, Rfl: 2   albuterol (VENTOLIN HFA) 108 (90 Base) MCG/ACT inhaler, SMARTSIG:1 Puff(s) Via Inhaler Every 4 Hours PRN, Disp: , Rfl:    budesonide-formoterol (SYMBICORT) 160-4.5 MCG/ACT inhaler, Inhale 2 puffs into the lungs in the morning and at bedtime., Disp: 1 each, Rfl: 3   fluticasone (FLONASE) 50 MCG/ACT nasal spray, Place 1 spray into both nostrils daily., Disp: , Rfl:    ibuprofen (ADVIL,MOTRIN) 800 MG tablet, Take 1 tablet (800 mg total) by mouth every 8 (eight) hours as needed for pain., Disp: 40 tablet, Rfl: 1   loratadine (CLARITIN) 10 MG tablet, Take 10 mg by mouth daily., Disp: , Rfl:    norgestimate-ethinyl estradiol (ORTHO-CYCLEN) 0.25-35 MG-MCG tablet, Take 1 tablet by mouth daily., Disp: , Rfl:    sertraline (ZOLOFT) 50 MG tablet, Take 50 mg by mouth daily., Disp: , Rfl:    methylPREDNISolone (MEDROL DOSEPAK) 4 MG TBPK tablet, Take 4 tablets x 2 days, 3 tablets x 2 days, 2 tablets  x 2 days, 1 tablet x 3 days (Patient not taking: Reported on 08/30/2022), Disp: 21 each, Rfl: 0   Prenatal Vit-Fe Fumarate-FA (PRENATAL 19) 29-1 MG CHEW, Chew 2 tablets by mouth daily. (Patient not taking: Reported on 08/02/2022), Disp: 60 tablet, Rfl: 12  Past Medical History: Past Medical History:  Diagnosis Date   Blood transfusion without reported diagnosis    pp hemorrhage 2011 twin delivery   Endometriosis    Frequent UTI    Headache(784.0)    migraine   Hx of preeclampsia, prior pregnancy, currently  pregnant    Normal pregnancy 06/01/2012   SVD (spontaneous vaginal delivery) 06/02/2012    Tobacco Use: Social History   Tobacco Use  Smoking Status Former   Years: 2.00   Types: Cigarettes  Smokeless Tobacco Never    Labs: Review Flowsheet        No data to display          Capillary Blood Glucose: No results found for: "GLUCAP"   Pulmonary Assessment Scores:  Pulmonary Assessment Scores     Row Name 08/30/22 1109         ADL UCSD   ADL Phase Entry     SOB Score total 86       CAT Score   CAT Score 31             UCSD: Self-administered rating of dyspnea associated with activities of daily living (ADLs) 6-point scale (0 = "not at all" to 5 = "maximal or unable to do because of breathlessness")  Scoring Scores range from 0 to 120.  Minimally important difference is 5 units  CAT: CAT can identify the health impairment of COPD patients and is better correlated with disease progression.  CAT has a scoring range of zero to 40. The CAT score is classified into four groups of low (less than 10), medium (10 - 20), high (21-30) and very high (31-40) based on the impact level of disease on health status. A CAT score over 10 suggests significant  symptoms.  A worsening CAT score could be explained by an exacerbation, poor medication adherence, poor inhaler technique, or progression of COPD or comorbid conditions.  CAT MCID is 2 points  mMRC: mMRC (Modified Medical Research Council) Dyspnea Scale is used to assess the degree of baseline functional disability in patients of respiratory disease due to dyspnea. No minimal important difference is established. A decrease in score of 1 point or greater is considered a positive change.   Pulmonary Function Assessment:   Exercise Target Goals: Exercise Program Goal: Individual exercise prescription set using results from initial 6 min walk test and THRR while considering  patient's activity barriers and safety.    Exercise Prescription Goal: Initial exercise prescription builds to 30-45 minutes a day of aerobic activity, 2-3 days per week.  Home exercise guidelines will be given to patient during program as part of exercise prescription that the participant will acknowledge.  Activity Barriers & Risk Stratification:  Activity Barriers & Cardiac Risk Stratification - 08/30/22 1050       Activity Barriers & Cardiac Risk Stratification   Activity Barriers Shortness of Breath;Deconditioning;Muscular Weakness;Joint Problems             6 Minute Walk:  6 Minute Walk     Row Name 08/30/22 1138         6 Minute Walk   Phase Initial     Distance 1080 feet     Walk Time 6 minutes     # of Rest Breaks 0     MPH 2.05     METS 3.85     RPE 12     Perceived Dyspnea  1.5     VO2 Peak 13.48     Symptoms No     Resting HR 102 bpm     Resting BP 98/72     Resting Oxygen Saturation  97 %     Exercise Oxygen Saturation  during 6 min walk 95 %     Max Ex. HR 105 bpm     Max Ex. BP 102/66     2 Minute Post BP 100/62       Interval HR   1 Minute HR 102     2 Minute HR 101     3 Minute HR 100     4 Minute HR 102     5 Minute HR 102     6 Minute HR 105     2 Minute Post HR 88     Interval Heart Rate? Yes       Interval Oxygen   Interval Oxygen? Yes     Baseline Oxygen Saturation % 97 %     1 Minute Oxygen Saturation % 96 %     1 Minute Liters of Oxygen 0 L     2 Minute Oxygen Saturation % 95 %     2 Minute Liters of Oxygen 0 L     3 Minute Oxygen Saturation % 95 %     3 Minute Liters of Oxygen 0 L     4 Minute Oxygen Saturation % 95 %     4 Minute Liters of Oxygen 0 L     5 Minute Oxygen Saturation % 96 %     5 Minute Liters of Oxygen 0 L     6 Minute Oxygen Saturation % 95 %     6 Minute Liters of Oxygen 0 L     2 Minute Post Oxygen Saturation % 96 %  2 Minute Post Liters of Oxygen 0 L              Oxygen Initial Assessment:  Oxygen Initial Assessment - 08/30/22 1053        Home Oxygen   Home Oxygen Device None    Sleep Oxygen Prescription None    Home Exercise Oxygen Prescription None    Home Resting Oxygen Prescription None      Initial 6 min Walk   Oxygen Used None      Program Oxygen Prescription   Program Oxygen Prescription None      Intervention   Short Term Goals To learn and understand importance of maintaining oxygen saturations>88%;To learn and demonstrate proper use of respiratory medications;To learn and understand importance of monitoring SPO2 with pulse oximeter and demonstrate accurate use of the pulse oximeter.;To learn and demonstrate proper pursed lip breathing techniques or other breathing techniques.     Long  Term Goals Maintenance of O2 saturations>88%;Compliance with respiratory medication;Verbalizes importance of monitoring SPO2 with pulse oximeter and return demonstration;Exhibits proper breathing techniques, such as pursed lip breathing or other method taught during program session;Demonstrates proper use of MDI's             Oxygen Re-Evaluation:  Oxygen Re-Evaluation     Row Name 08/30/22 1353             Program Oxygen Prescription   Program Oxygen Prescription None         Home Oxygen   Home Oxygen Device None       Sleep Oxygen Prescription None       Home Exercise Oxygen Prescription None       Home Resting Oxygen Prescription None         Goals/Expected Outcomes   Short Term Goals To learn and understand importance of maintaining oxygen saturations>88%;To learn and demonstrate proper use of respiratory medications;To learn and understand importance of monitoring SPO2 with pulse oximeter and demonstrate accurate use of the pulse oximeter.;To learn and demonstrate proper pursed lip breathing techniques or other breathing techniques.        Long  Term Goals Maintenance of O2 saturations>88%;Compliance with respiratory medication;Verbalizes importance of monitoring SPO2 with pulse oximeter and return  demonstration;Exhibits proper breathing techniques, such as pursed lip breathing or other method taught during program session;Demonstrates proper use of MDI's       Goals/Expected Outcomes Compliance and understanding of oxygen saturation monitoring and breathing techniques to decrease shortness of breath.                Oxygen Discharge (Final Oxygen Re-Evaluation):  Oxygen Re-Evaluation - 08/30/22 1353       Program Oxygen Prescription   Program Oxygen Prescription None      Home Oxygen   Home Oxygen Device None    Sleep Oxygen Prescription None    Home Exercise Oxygen Prescription None    Home Resting Oxygen Prescription None      Goals/Expected Outcomes   Short Term Goals To learn and understand importance of maintaining oxygen saturations>88%;To learn and demonstrate proper use of respiratory medications;To learn and understand importance of monitoring SPO2 with pulse oximeter and demonstrate accurate use of the pulse oximeter.;To learn and demonstrate proper pursed lip breathing techniques or other breathing techniques.     Long  Term Goals Maintenance of O2 saturations>88%;Compliance with respiratory medication;Verbalizes importance of monitoring SPO2 with pulse oximeter and return demonstration;Exhibits proper breathing techniques, such as pursed lip breathing or other method taught during  program session;Demonstrates proper use of MDI's    Goals/Expected Outcomes Compliance and understanding of oxygen saturation monitoring and breathing techniques to decrease shortness of breath.             Initial Exercise Prescription:   Perform Capillary Blood Glucose checks as needed.  Exercise Prescription Changes:   Exercise Comments:   Exercise Goals and Review:   Exercise Goals     Row Name 08/30/22 1052 08/30/22 1352           Exercise Goals   Increase Physical Activity Yes Yes      Intervention Provide advice, education, support and counseling about physical  activity/exercise needs.;Develop an individualized exercise prescription for aerobic and resistive training based on initial evaluation findings, risk stratification, comorbidities and participant's personal goals. Provide advice, education, support and counseling about physical activity/exercise needs.;Develop an individualized exercise prescription for aerobic and resistive training based on initial evaluation findings, risk stratification, comorbidities and participant's personal goals.      Expected Outcomes Short Term: Attend rehab on a regular basis to increase amount of physical activity.;Long Term: Exercising regularly at least 3-5 days a week.;Long Term: Add in home exercise to make exercise part of routine and to increase amount of physical activity. Short Term: Attend rehab on a regular basis to increase amount of physical activity.;Long Term: Exercising regularly at least 3-5 days a week.;Long Term: Add in home exercise to make exercise part of routine and to increase amount of physical activity.      Increase Strength and Stamina Yes Yes      Intervention Provide advice, education, support and counseling about physical activity/exercise needs.;Develop an individualized exercise prescription for aerobic and resistive training based on initial evaluation findings, risk stratification, comorbidities and participant's personal goals. Provide advice, education, support and counseling about physical activity/exercise needs.;Develop an individualized exercise prescription for aerobic and resistive training based on initial evaluation findings, risk stratification, comorbidities and participant's personal goals.      Expected Outcomes Short Term: Increase workloads from initial exercise prescription for resistance, speed, and METs.;Short Term: Perform resistance training exercises routinely during rehab and add in resistance training at home;Long Term: Improve cardiorespiratory fitness, muscular endurance  and strength as measured by increased METs and functional capacity (6MWT) Short Term: Increase workloads from initial exercise prescription for resistance, speed, and METs.;Short Term: Perform resistance training exercises routinely during rehab and add in resistance training at home;Long Term: Improve cardiorespiratory fitness, muscular endurance and strength as measured by increased METs and functional capacity (6MWT)      Able to understand and use rate of perceived exertion (RPE) scale Yes Yes      Intervention Provide education and explanation on how to use RPE scale Provide education and explanation on how to use RPE scale      Expected Outcomes Short Term: Able to use RPE daily in rehab to express subjective intensity level;Long Term:  Able to use RPE to guide intensity level when exercising independently Short Term: Able to use RPE daily in rehab to express subjective intensity level;Long Term:  Able to use RPE to guide intensity level when exercising independently      Able to understand and use Dyspnea scale Yes Yes      Intervention Provide education and explanation on how to use Dyspnea scale Provide education and explanation on how to use Dyspnea scale      Expected Outcomes Short Term: Able to use Dyspnea scale daily in rehab to express subjective sense of shortness  of breath during exertion;Long Term: Able to use Dyspnea scale to guide intensity level when exercising independently Short Term: Able to use Dyspnea scale daily in rehab to express subjective sense of shortness of breath during exertion;Long Term: Able to use Dyspnea scale to guide intensity level when exercising independently      Knowledge and understanding of Target Heart Rate Range (THRR) Yes Yes      Intervention Provide education and explanation of THRR including how the numbers were predicted and where they are located for reference Provide education and explanation of THRR including how the numbers were predicted and where  they are located for reference      Expected Outcomes Short Term: Able to state/look up THRR;Long Term: Able to use THRR to govern intensity when exercising independently;Short Term: Able to use daily as guideline for intensity in rehab Short Term: Able to state/look up THRR;Long Term: Able to use THRR to govern intensity when exercising independently;Short Term: Able to use daily as guideline for intensity in rehab      Able to check pulse independently -- --      Intervention -- --      Expected Outcomes -- --      Understanding of Exercise Prescription Yes Yes      Intervention Provide education, explanation, and written materials on patient's individual exercise prescription Provide education, explanation, and written materials on patient's individual exercise prescription      Expected Outcomes Short Term: Able to explain program exercise prescription;Long Term: Able to explain home exercise prescription to exercise independently Short Term: Able to explain program exercise prescription;Long Term: Able to explain home exercise prescription to exercise independently               Exercise Goals Re-Evaluation :  Exercise Goals Re-Evaluation     Row Name 08/30/22 1352             Exercise Goal Re-Evaluation   Exercise Goals Review Increase Physical Activity;Able to understand and use Dyspnea scale;Understanding of Exercise Prescription;Increase Strength and Stamina;Knowledge and understanding of Target Heart Rate Range (THRR);Able to understand and use rate of perceived exertion (RPE) scale;Able to check pulse independently       Comments Patient is scheduled to begin exercise next week. Will continue to monitor and progress as able.       Expected Outcomes Through exercise at rehab and home, the patient will decrease shortness of breath with daily activities and feel confident in carrying out an exercise regimen at home.                Discharge Exercise Prescription (Final  Exercise Prescription Changes):   Nutrition:  Target Goals: Understanding of nutrition guidelines, daily intake of sodium '1500mg'$ , cholesterol '200mg'$ , calories 30% from fat and 7% or less from saturated fats, daily to have 5 or more servings of fruits and vegetables.  Biometrics:  Pre Biometrics - 08/30/22 1035       Pre Biometrics   Grip Strength 18 kg              Nutrition Therapy Plan and Nutrition Goals:   Nutrition Assessments:  MEDIFICTS Score Key: ?70 Need to make dietary changes  40-70 Heart Healthy Diet ? 40 Therapeutic Level Cholesterol Diet   Picture Your Plate Scores: [id number] Unhealthy dietary pattern with much room for improvement. 41-50 Dietary pattern unlikely to meet recommendations for good health and room for improvement. 51-60 More healthful dietary pattern, with some room for improvement.  >  60 Healthy dietary pattern, although there may be some specific behaviors that could be improved.    Nutrition Goals Re-Evaluation:   Nutrition Goals Discharge (Final Nutrition Goals Re-Evaluation):   Psychosocial: Target Goals: Acknowledge presence or absence of significant depression and/or stress, maximize coping skills, provide positive support system. Participant is able to verbalize types and ability to use techniques and skills needed for reducing stress and depression.  Initial Review & Psychosocial Screening:  Initial Psych Review & Screening - 08/30/22 1047       Initial Review   Current issues with Current Anxiety/Panic;Current Psychotropic Meds      Family Dynamics   Good Support System? Yes    Comments anxiety managed with meds      Barriers   Psychosocial barriers to participate in program There are no identifiable barriers or psychosocial needs.      Screening Interventions   Interventions Encouraged to exercise             Quality of Life Scores:  Scores of 19 and below usually indicate a poorer quality of life in these areas.   A difference of  2-3 points is a clinically meaningful difference.  A difference of 2-3 points in the total score of the Quality of Life Index has been associated with significant improvement in overall quality of life, self-image, physical symptoms, and general health in studies assessing change in quality of life.  PHQ-9: Review Flowsheet       08/30/2022  Depression screen PHQ 2/9  Decreased Interest 0  Down, Depressed, Hopeless 0  PHQ - 2 Score 0  Altered sleeping 0  Tired, decreased energy 0  Change in appetite 0  Feeling bad or failure about yourself  0  Trouble concentrating 0  Moving slowly or fidgety/restless 0  Suicidal thoughts 0  PHQ-9 Score 0  Difficult doing work/chores Somewhat difficult   Interpretation of Total Score  Total Score Depression Severity:  1-4 = Minimal depression, 5-9 = Mild depression, 10-14 = Moderate depression, 15-19 = Moderately severe depression, 20-27 = Severe depression   Psychosocial Evaluation and Intervention:  Psychosocial Evaluation - 08/30/22 1205       Psychosocial Evaluation & Interventions   Interventions Encouraged to exercise with the program and follow exercise prescription    Comments Pt denies any psychosocial or barriers concerns    Expected Outcomes Pt will partcipate in PR without any psychosocial barriers or concerns    Continue Psychosocial Services  No Follow up required             Psychosocial Re-Evaluation:  Psychosocial Re-Evaluation     [place] Name 08/30/22 1401             Psychosocial Re-Evaluation   Current issues with Current Anxiety/Panic;Current Psychotropic Meds       Comments Pt has not started PR yet       Expected Outcomes For pt to participate in PR without any psychosocial barriers or concerns       Interventions Encouraged to attend Pulmonary Rehabilitation for the exercise       Continue Psychosocial Services  No Follow up required                Psychosocial Discharge (Final  Psychosocial Re-Evaluation):  Psychosocial Re-Evaluation - 08/30/22 1401       Psychosocial Re-Evaluation   Current issues with Current Anxiety/Panic;Current Psychotropic Meds    Comments Pt has not started PR yet    Expected Outcomes For pt to  participate in PR without any psychosocial barriers or concerns    Interventions Encouraged to attend Pulmonary Rehabilitation for the exercise    Continue Psychosocial Services  No Follow up required             Education: Education Goals: Education classes will be provided on a weekly basis, covering required topics. Participant will state understanding/return demonstration of topics presented.  Learning Barriers/Preferences:  Learning Barriers/Preferences - 08/30/22 1049       Learning Barriers/Preferences   Learning Barriers None    Learning Preferences None             Education Topics: Introduction to Pulmonary Rehab Group instruction provided by PowerPoint, verbal discussion, and written material to support subject matter. Instructor reviews what Pulmonary Rehab is, the purpose of the program, and how patients are referred.     Know Your Numbers Group instruction that is supported by a PowerPoint presentation. Instructor discusses importance of knowing and understanding resting, exercise, and post-exercise oxygen saturation, heart rate, and blood pressure. Oxygen saturation, heart rate, blood pressure, rating of perceived exertion, and dyspnea are reviewed along with a normal range for these values.    Exercise for the Pulmonary Patient Group instruction that is supported by a PowerPoint presentation. Instructor discusses benefits of exercise, core components of exercise, frequency, duration, and intensity of an exercise routine, importance of utilizing pulse oximetry during exercise, safety while exercising, and options of places to exercise outside of rehab.       MET Level  Group instruction provided by PowerPoint,  verbal discussion, and written material to support subject matter. Instructor reviews what METs are and how to increase METs.    Pulmonary Medications Verbally interactive group education provided by instructor with focus on inhaled medications and proper administration.   Anatomy and Physiology of the Respiratory System Group instruction provided by PowerPoint, verbal discussion, and written material to support subject matter. Instructor reviews respiratory cycle and anatomical components of the respiratory system and their functions. Instructor also reviews differences in obstructive and restrictive respiratory diseases with examples of each.    Oxygen Safety Group instruction provided by PowerPoint, verbal discussion, and written material to support subject matter. There is an overview of "What is Oxygen" and "Why do we need it".  Instructor also reviews how to create a safe environment for oxygen use, the importance of using oxygen as prescribed, and the risks of noncompliance. There is a brief discussion on traveling with oxygen and resources the patient may utilize.   Oxygen Use Group instruction provided by PowerPoint, verbal discussion, and written material to discuss how supplemental oxygen is prescribed and different types of oxygen supply systems. Resources for more information are provided.    Breathing Techniques Group instruction that is supported by demonstration and informational handouts. Instructor discusses the benefits of pursed lip and diaphragmatic breathing and detailed demonstration on how to perform both.     Risk Factor Reduction Group instruction that is supported by a PowerPoint presentation. Instructor discusses the definition of a risk factor, different risk factors for pulmonary disease, and how the heart and lungs work together.   MD Day A group question and answer session with a medical doctor that allows participants to ask questions that relate to their  pulmonary disease state.   Nutrition for the Pulmonary Patient Group instruction provided by PowerPoint slides, verbal discussion, and written materials to support subject matter. The instructor gives an explanation and review of healthy diet recommendations, which includes a discussion  on weight management, recommendations for fruit and vegetable consumption, as well as protein, fluid, caffeine, fiber, sodium, sugar, and alcohol. Tips for eating when patients are short of breath are discussed.    Other Education Group or individual verbal, written, or video instructions that support the educational goals of the pulmonary rehab program.    Knowledge Questionnaire Score:  Knowledge Questionnaire Score - 08/30/22 1112       Knowledge Questionnaire Score   Pre Score 16/18             Core Components/Risk Factors/Patient Goals at Admission:  Personal Goals and Risk Factors at Admission - 08/30/22 1050       Core Components/Risk Factors/Patient Goals on Admission    Weight Management Weight Maintenance    Improve shortness of breath with ADL's Yes    Intervention Provide education, individualized exercise plan and daily activity instruction to help decrease symptoms of SOB with activities of daily living.    Expected Outcomes Short Term: Improve cardiorespiratory fitness to achieve a reduction of symptoms when performing ADLs    Increase knowledge of respiratory medications and ability to use respiratory devices properly  Yes    Intervention Provide education and demonstration as needed of appropriate use of medications, inhalers, and oxygen therapy.    Expected Outcomes Short Term: Achieves understanding of medications use. Understands that oxygen is a medication prescribed by physician. Demonstrates appropriate use of inhaler and oxygen therapy.;Long Term: Maintain appropriate use of medications, inhalers, and oxygen therapy.             Core Components/Risk Factors/Patient  Goals Review:   Goals and Risk Factor Review     Row Name 08/30/22 1402             Core Components/Risk Factors/Patient Goals Review   Personal Goals Review Improve shortness of breath with ADL's;Develop more efficient breathing techniques such as purse lipped breathing and diaphragmatic breathing and practicing self-pacing with activity.;Increase knowledge of respiratory medications and ability to use respiratory devices properly.       Review Pt is scheduled to begin exercise next week. Will continue to monitor.       Expected Outcomes See admission goals                Core Components/Risk Factors/Patient Goals at Discharge (Final Review):   Goals and Risk Factor Review - 08/30/22 1402       Core Components/Risk Factors/Patient Goals Review   Personal Goals Review Improve shortness of breath with ADL's;Develop more efficient breathing techniques such as purse lipped breathing and diaphragmatic breathing and practicing self-pacing with activity.;Increase knowledge of respiratory medications and ability to use respiratory devices properly.    Review Pt is scheduled to begin exercise next week. Will continue to monitor.    Expected Outcomes See admission goals             ITP Comments:   Comments: Dr. Rodman Pickle is Medical Director for Pulmonary Rehab at [facility].

## 2022-09-05 ENCOUNTER — Encounter (HOSPITAL_COMMUNITY)
Admission: RE | Admit: 2022-09-05 | Discharge: 2022-09-05 | Disposition: A | Discharge status: Home / Self Care | Payer: BC Managed Care – PPO | Source: Ambulatory Visit | Attending: Obstetrics and Gynecology | Admitting: Obstetrics and Gynecology

## 2022-09-05 DIAGNOSIS — R0602 Shortness of breath: Secondary | ICD-10-CM

## 2022-09-05 DIAGNOSIS — U099 Post covid-19 condition, unspecified: Secondary | ICD-10-CM

## 2022-09-05 NOTE — Progress Notes (Signed)
Daily Session Note  Patient Details  Name: Cheryl Wright MRN: [id number] Date of Birth: 1975-08-07 Referring Provider:    Encounter Date: 09/05/2022  Check In:  Session Check In - 09/05/22 1208       Check-In   Supervising physician immediately available to respond to emergencies [facility] MD immediately available    Physician(s) Dr Gala Romney    [facility]    Staff Present Janine Ores, RN, Quentin Ore, MS, ACSM-CEP, Exercise Physiologist;Randi Yevonne Pax, ACSM-CEP, Exercise Physiologist;Samantha [place], RD, LDN;Other    Virtual Visit No    Medication changes reported     No    Fall or balance concerns reported    No    Tobacco Cessation No Change    Warm-up and Cool-down Performed as group-led instruction    Resistance Training Performed Yes    VAD Patient? No    PAD/SET Patient? No      Pain Assessment   Currently in Pain? No/denies             Capillary Blood Glucose: No results found for this or any previous visit (from the past 24 hour(s)).    Social History   Tobacco Use  Smoking Status Former   Years: 2   Types: Cigarettes  Smokeless Tobacco Never    Goals Met:  Proper associated with RPD/PD & O2 Sat Exercise tolerated well No report of concerns or symptoms today Strength training completed today  Goals Unmet:  Not Applicable  Comments: Service time is from 1016 to 1141.    Dr. Rodman Pickle is Medical Director for Pulmonary Rehab at [facility].

## 2022-09-10 ENCOUNTER — Encounter (HOSPITAL_COMMUNITY): Payer: Self-pay

## 2022-09-10 ENCOUNTER — Encounter (HOSPITAL_COMMUNITY)
Admission: RE | Admit: 2022-09-10 | Discharge: 2022-09-10 | Disposition: A | Discharge status: Home / Self Care | Payer: BC Managed Care – PPO | Source: Ambulatory Visit | Attending: Obstetrics and Gynecology | Admitting: Obstetrics and Gynecology

## 2022-09-10 VITALS — Wt 159.0 lb

## 2022-09-10 DIAGNOSIS — R0602 Shortness of breath: Secondary | ICD-10-CM | POA: Diagnosis not present

## 2022-09-10 DIAGNOSIS — U099 Post covid-19 condition, unspecified: Secondary | ICD-10-CM

## 2022-09-10 NOTE — Progress Notes (Signed)
Daily Session Note  Patient Details  Name: Cheryl Wright MRN: [id number] Date of Birth: 1976/06/12 Referring Provider:    Encounter Date: 09/10/2022  Check In:  Session Check In - 09/10/22 1211       Check-In   Supervising physician immediately available to respond to emergencies [facility] immediately available    Physician(s) Eric Form, NP    [facility]    Staff Present Janine Ores, RN, Quentin Ore, MS, ACSM-CEP, Exercise Physiologist;Other    Virtual Visit No    Medication changes reported     No    Fall or balance concerns reported    No    Tobacco Cessation No Change    Warm-up and Cool-down Performed as group-led instruction    Resistance Training Performed Yes    VAD Patient? No    PAD/SET Patient? No      Pain Assessment   Currently in Pain? No/denies    Pain Score 0-No pain    Multiple Pain Sites No             Capillary Blood Glucose: No results found for this or any previous visit (from the past 24 hour(s)).   Exercise Prescription Changes - 09/10/22 1200       Response to Exercise   Blood Pressure (Admit) 94/62    Blood Pressure (Exercise) 124/72    Blood Pressure (Exit) 87/60    Heart Rate (Admit) 105 bpm    Heart Rate (Exercise) 96 bpm    Heart Rate (Exit) 88 bpm    Oxygen Saturation (Admit) 95 %    Oxygen Saturation (Exercise) 96 %    Oxygen Saturation (Exit) 98 %    Rating of Perceived Exertion (Exercise) 12    Perceived Dyspnea (Exercise) 3    Duration Progress to 30 minutes of  aerobic without signs/symptoms of physical distress    Intensity THRR unchanged      Progression   Progression Continue to progress workloads to maintain intensity without signs/symptoms of physical distress.      Resistance Training   Weight --   blue bands   Reps 10-15    Time 10 Minutes      Treadmill   MPH 2    Grade 0.5    Minutes 15      Recumbant Elliptical   Level 2    Minutes 15    METs 4.1              Social History   Tobacco Use  Smoking Status Former   Years: 2   Types: Cigarettes  Smokeless Tobacco Never    Goals Met:  Independence with exercise equipment Exercise tolerated well No report of concerns or symptoms today Strength training completed today  Goals Unmet:  Not Applicable  Comments: Service time is from 1017 to [place]    Dr. Rodman Pickle is Medical Director for Pulmonary Rehab at [facility].

## 2022-09-12 ENCOUNTER — Encounter (HOSPITAL_COMMUNITY)
Admission: RE | Admit: 2022-09-12 | Discharge: 2022-09-12 | Disposition: A | Discharge status: Home / Self Care | Payer: BC Managed Care – PPO | Source: Ambulatory Visit | Attending: Obstetrics and Gynecology | Admitting: Obstetrics and Gynecology

## 2022-09-12 ENCOUNTER — Telehealth: Payer: Self-pay | Admitting: Pulmonary Disease

## 2022-09-12 DIAGNOSIS — I959 Hypotension, unspecified: Secondary | ICD-10-CM

## 2022-09-12 DIAGNOSIS — R0602 Shortness of breath: Secondary | ICD-10-CM

## 2022-09-12 DIAGNOSIS — U099 Post covid-19 condition, unspecified: Secondary | ICD-10-CM

## 2022-09-12 NOTE — Telephone Encounter (Signed)
Called and spoke with pt about her BP. Pt said that her BP usually runs a little on the lower side but states when she was doing pulmonary rehab last week, BP was 87/60 last Tuesday afternoon. Pt said when she checked in to pulmonary rehab today, BP was 90 systolic today but when she was leaving, her BP was around 87/56.  Pt has been having fatigue, has been dizzy, feeling faint when she goes to stand up, and also has been having headaches and has also been nauseous for the past week. Pt was told to call office by pulmonary rehab.  Pt said she has been drinking a lot of water and non sugar decaffeinated fluids.   Pt does not see a cardiologist.  Pt does have an upcoming appt with Dr. Loanne Drilling 3/26 but was still told to call office by pulmonary rehab due to her symptoms.   Dr. Loanne Drilling, please advise on this for pt.

## 2022-09-12 NOTE — Telephone Encounter (Signed)
Confirmed with patient episodes of low-normal blood pressure during exercise with the lowest SBP in the upper 80s. Denies syncope but does report headache, fatigue and dizziness when standing up. She reports good hydration.  Recommend further evaluation with echocardiogram. Order placed

## 2022-09-12 NOTE — Progress Notes (Signed)
Daily Session Note  Patient Details  Name: Cheryl Wright MRN: [id number] Date of Birth: [birth year] Referring Provider:    Encounter Date: 09/12/2022  Check In:  Session Check In - 09/12/22 1212       Check-In   Supervising physician immediately available to respond to emergencies [facility] immediately available    Physician(s) Dr Marlyce Huge    [facility]    Staff Present Janine Ores, RN, Quentin Ore, MS, ACSM-CEP, Exercise Physiologist;Other;Samantha [place], RD, LDN    Virtual Visit No    Medication changes reported     No    Fall or balance concerns reported    No    Tobacco Cessation No Change    Warm-up and Cool-down Performed as group-led instruction    Resistance Training Performed Yes    VAD Patient? No    PAD/SET Patient? No      Pain Assessment   Currently in Pain? No/denies             Capillary Blood Glucose: No results found for this or any previous visit (from the past 24 hour(s)).    Social History   Tobacco Use  Smoking Status Former   Years: 2   Types: Cigarettes  Smokeless Tobacco Never    Goals Met:  Proper associated with RPD/PD & O2 Sat Independence with exercise equipment Exercise tolerated well No report of concerns or symptoms today Strength training completed today  Goals Unmet:  Not Applicable  Comments: Service time is from 1017 to 1140.    Dr. Rodman Pickle is Medical Director for Pulmonary Rehab at [facility].

## 2022-09-17 ENCOUNTER — Ambulatory Visit (INDEPENDENT_AMBULATORY_CARE_PROVIDER_SITE_OTHER): Payer: BC Managed Care – PPO | Admitting: Pulmonary Disease

## 2022-09-17 ENCOUNTER — Ambulatory Visit (HOSPITAL_BASED_OUTPATIENT_CLINIC_OR_DEPARTMENT_OTHER): Payer: BC Managed Care – PPO | Admitting: Pulmonary Disease

## 2022-09-17 ENCOUNTER — Encounter (HOSPITAL_COMMUNITY)
Admission: RE | Admit: 2022-09-17 | Discharge: 2022-09-17 | Disposition: A | Discharge status: Home / Self Care | Payer: BC Managed Care – PPO | Source: Ambulatory Visit | Attending: Obstetrics and Gynecology | Admitting: Obstetrics and Gynecology

## 2022-09-17 ENCOUNTER — Encounter (HOSPITAL_BASED_OUTPATIENT_CLINIC_OR_DEPARTMENT_OTHER): Payer: Self-pay | Admitting: Pulmonary Disease

## 2022-09-17 VITALS — BP 116/68 | HR 91 | Temp 98.3°F | Ht 68.0 in | Wt 162.8 lb

## 2022-09-17 DIAGNOSIS — I959 Hypotension, unspecified: Secondary | ICD-10-CM

## 2022-09-17 DIAGNOSIS — U099 Post covid-19 condition, unspecified: Secondary | ICD-10-CM

## 2022-09-17 DIAGNOSIS — R0602 Shortness of breath: Secondary | ICD-10-CM | POA: Diagnosis not present

## 2022-09-17 DIAGNOSIS — J42 Unspecified chronic bronchitis: Secondary | ICD-10-CM

## 2022-09-17 LAB — PULMONARY FUNCTION TEST
DL/VA % pred: 91 %
DL/VA: 3.87 ml/min/mmHg/L
DLCO cor % pred: 95 %
DLCO cor: 23.29 ml/min/mmHg
DLCO unc % pred: 98 %
DLCO unc: 23.91 ml/min/mmHg
FEF 25-75 Post: 2.59 L/sec
FEF 25-75 Pre: 2.48 L/sec
FEF2575-%Change-Post: 4 %
FEF2575-%Pred-Post: 82 %
FEF2575-%Pred-Pre: 78 %
FEV1-%Change-Post: 0 %
FEV1-%Pred-Post: 97 %
FEV1-%Pred-Pre: 97 %
FEV1-Post: 3.19 L
FEV1-Pre: 3.22 L
FEV1FVC-%Change-Post: 0 %
FEV1FVC-%Pred-Pre: 89 %
FEV6-%Change-Post: 0 %
FEV6-%Pred-Post: 110 %
FEV6-%Pred-Pre: 109 %
FEV6-Post: 4.43 L
FEV6-Pre: 4.41 L
FEV6FVC-%Change-Post: 0 %
FEV6FVC-%Pred-Post: 102 %
FEV6FVC-%Pred-Pre: 102 %
FVC-%Change-Post: 0 %
FVC-%Pred-Post: 107 %
FVC-%Pred-Pre: 107 %
FVC-Post: 4.43 L
FVC-Pre: 4.43 L
Post FEV1/FVC ratio: 72 %
Post FEV6/FVC ratio: 100 %
Pre FEV1/FVC ratio: 73 %
Pre FEV6/FVC Ratio: 100 %
RV % pred: 134 %
RV: 2.56 L
TLC % pred: 124 %
TLC: 7.03 L

## 2022-09-17 MED ORDER — AIRSUPRA 90-80 MCG/ACT IN AERO: 2.0000 | INHALATION_SPRAY | RESPIRATORY_TRACT | 5 refills | Status: DC | PRN

## 2022-09-17 NOTE — Progress Notes (Unsigned)
Subjective:   PATIENT ID: Cheryl Wright GENDER: female DOB: 21-May-1976, MRN: [id number]  No chief complaint on file.   Reason for Visit: New consult for recent COVID  Cheryl Wright is a 47 year old female former smoker with endometriosis, migraine, seasonal allergies and GAD who presents for evaluation for respiratory symptoms.  PCP note with Dr. Brigitte Pulse on 07/18/22 presented with cough and dyspnea. She has had covid in September 2023 and again in May 29, 2022. Her covid symptoms were improving until end of December when she began having more productive cough and left pleuritic chest pain. She was treated with Septra and albuterol inhaler 07/10/22 and improved cough but has worsening shortness of breath and congestion. Tested positive for flu B on 07/18/22. Stopped taking Septra after taking 7 days. Took tamiflu and completed it. And took levaquin when CXR showed possible pneumonia  She had a CT Chest on 07/30/22 with impression: 1. Reticulonodular and tree in bud type opacities in right middle lobe suspected be infectious or inflammatory in etiology. Consider atypical etiologies including MAI. 2. Several small lung nodules as detailed, 5 mm and less. No follow-up needed if patient is low risk. CXR report on 07/18/21 with interstitial changes at the bases however resolved on follow-up CXR 07/29/22.  She reports her nasal congestion and productive cough that worsened in the beginning of January. Went on a ski trip and felt sick during the trip. Worsening shortness of breath. After finishing her antibiotics she is laying in bed and feeling fatigued. She was evaluated in ED on 2/5. Treated with IV fluids, steroids. Has completed steroids but she feels it was not effective.  At baseline she reports she does have wheezing and cough when exposed to outdoor allergens. Uses saline nasal solution. Associated with chest tightness. Now she feels like she has chronic symptoms. Denies childhood asthma.  Albuterol initially helped but does not feel like albuterol inhaler anymore due to head spinning. Cough is less productive. Has nasal congestion. Has wheezing/whistle with laying down.  09/17/22 Since our last visit she has started pulmonary rehab. Has received one round steroids ordered on 08/16/22. No further steroids. She reports shortness of breath has improved. Still intermittently feeling fatigued, shortness of breath and chest pain. Albuterol use is variable. At least 1-2 times daily.   Social History: Social smoker <10 years weekly. <1/2 ppd. Quit in 2202 Second hand smoke exposure  Past Medical History:  Diagnosis Date   Blood transfusion without reported diagnosis    pp hemorrhage 2011 twin delivery   Endometriosis    Frequent UTI    Headache(784.0)    migraine   Hx of preeclampsia, prior pregnancy, currently pregnant    Normal pregnancy 06/01/2012   SVD (spontaneous vaginal delivery) 06/02/2012     Family History  Problem Relation Age of Onset   Hyperlipidemia Mother    Hypertension Mother    Rheum arthritis Father    Lung cancer Father        mesothelioma from [place]   Breast cancer Paternal Grandmother 7   Colon cancer Paternal Grandmother 2   Lung cancer Paternal Grandfather        dx in his 37s; smoker   Stroke Maternal Grandmother    Ovarian cancer Other        paternal grandfather's 2 sisters and mother in their 79s-60s   Breast cancer Other        father's paternal first cousin dx in her 49s   Breast  cancer Other        paternal grandmother's mother dx in her 91s     Social History   Occupational History   Not on file  Tobacco Use   Smoking status: Former    Years: 2    Types: Cigarettes   Smokeless tobacco: Never  Substance and Sexual Activity   Alcohol use: Yes    Comment: 3-4/week   Drug use: No   Sexual activity: Yes    Comment: preg    Allergies  Allergen Reactions   Penicillins Anaphylaxis, Hives and Swelling    Childhood reaction      Outpatient Medications Prior to Visit  Medication Sig Dispense Refill   albuterol (PROVENTIL) (2.5 MG/3ML) 0.083% nebulizer solution Take 3 mLs (2.5 mg total) by nebulization every 6 (six) hours as needed for wheezing or shortness of breath. 75 mL 2   albuterol (VENTOLIN HFA) 108 (90 Base) MCG/ACT inhaler SMARTSIG:1 Puff(s) Via Inhaler Every 4 Hours PRN     budesonide-formoterol (SYMBICORT) 160-4.5 MCG/ACT inhaler Inhale 2 puffs into the lungs in the morning and at bedtime. 1 each 3   fluticasone (FLONASE) 50 MCG/ACT nasal spray Place 1 spray into both nostrils daily.     ibuprofen (ADVIL,MOTRIN) 800 MG tablet Take 1 tablet (800 mg total) by mouth every 8 (eight) hours as needed for pain. 40 tablet 1   loratadine (CLARITIN) 10 MG tablet Take 10 mg by mouth daily.     norgestimate-ethinyl estradiol (ORTHO-CYCLEN) 0.25-35 MG-MCG tablet Take 1 tablet by mouth daily.     sertraline (ZOLOFT) 50 MG tablet Take 50 mg by mouth daily.     methylPREDNISolone (MEDROL DOSEPAK) 4 MG TBPK tablet Take 4 tablets x 2 days, 3 tablets x 2 days, 2 tablets  x 2 days, 1 tablet x 3 days (Patient not taking: Reported on 08/30/2022) 21 each 0   Prenatal Vit-Fe Fumarate-FA (PRENATAL 19) 29-1 MG CHEW Chew 2 tablets by mouth daily. (Patient not taking: Reported on 08/02/2022) 60 tablet 12   No facility-administered medications prior to visit.    Review of Systems  Constitutional:  Negative for chills, diaphoresis, fever, malaise/fatigue and weight loss.  HENT:  Negative for congestion.   Respiratory:  Positive for shortness of breath. Negative for cough, hemoptysis, sputum production and wheezing.   Cardiovascular:  Negative for chest pain, palpitations and leg swelling.     Objective:   Vitals:   09/17/22 1542  BP: 116/68  Pulse: 91  Temp: 98.3 F (36.8 C)  TempSrc: Oral  SpO2: 97%  Weight: 162 lb 12.8 oz (73.8 kg)  Height: 5\' 8"  (1.727 m)   SpO2: 97 % O2 Device: None (Room air)  Physical  Exam: General: Well-appearing, no acute distress HENT: [place], AT Eyes: EOMI, no scleral icterus Respiratory: Clear to auscultation bilaterally.  No crackles, wheezing or rales Cardiovascular: RRR, -M/R/G, no JVD Extremities:-Edema,-tenderness Neuro: AAO x4, CNII-XII grossly intact Psych: Normal mood, normal affect  Data Reviewed:  Imaging: CT Chest 07/30/22 1. Reticulonodular and tree-in-bud type opacities noted in the right  middle lobe suspected to be infectious or inflammatory in etiology.  Consider atypical etiologies including MAI.  2. Several small lung nodules as detailed, 5 mm and less. No  follow-up needed if patient is low-risk (and has no known or  suspected primary neoplasm).   PFT: 09/17/22 FVC 4.43 (107%) FEV1 3.19 (97%) Ratio 72  TLC 124% DLCO 98% Interpretation: Normal spirometry. Mild hyperinflation and air trapping. Normal gas exchange.  Labs:  CBC    Component Value Date/Time   WBC 10.6 (H) 07/29/2022 1425   RBC 4.26 07/29/2022 1425   HGB 14.3 07/29/2022 1425   HCT 41.3 07/29/2022 1425   PLT 289 07/29/2022 1425   MCV 96.9 07/29/2022 1425   MCH 33.6 07/29/2022 1425   MCHC 34.6 07/29/2022 1425   RDW 12.2 07/29/2022 1425   Abs eos 07/18/22 100     Assessment & Plan:   Discussion: 47 year old female former smoker with endometriosis, migraine, seasonal allergies and GAD who presents for evaluation for respiratory symptoms. We reviewed clinical course of COVID-19 including long-term complications including post-inflammatory lung disease and long hauler symptoms.   MAI/Atypical infection on CT Clinically low suspicion for this --Consider repeat scan if symptoms persistent around 3-6 months  --F/u on CD upload  Post-covid long hauler  Shortness of breath and wheezing --CONTINUE Symbicort 160-4.5 mcg TWO puffs in the morning and evening. Rinse mouth out after use to prevent thrush --ORDER Airsupra 1-2 puffs AS NEEDED every 4 hours --CONTINUE pulmonary  rehab  Low-normal BP --Echocardiogram scheduled  Health Maintenance Immunization History  Administered Date(s) Administered   Influenza Split 03/22/2014   Influenza,inj,Quad PF,6+ Mos 05/23/2021, 05/24/2022   PFIZER(Purple Top)SARS-COV-2 Vaccination 09/10/2019, 10/05/2019, 05/25/2020   Pfizer Covid-19 Vaccine Bivalent Booster 30yrs & up 07/14/2021   Tdap 03/26/2012, 05/24/2022   CT Lung Screen - not qualifed  No orders of the defined types were placed in this encounter.  No orders of the defined types were placed in this encounter.   No follow-ups on file.   I have spent a total time of 45-minutes on the day of the appointment reviewing prior documentation, coordinating care and discussing medical diagnosis and plan with the patient/family. Imaging, labs and tests included in this note have been reviewed and interpreted independently by me.  [place], MD [facility] 09/17/2022 3:54 PM  Office Number [phone]

## 2022-09-17 NOTE — Patient Instructions (Signed)
Post-covid long hauler  Shortness of breath and wheezing --CONTINUE Symbicort 160-4.5 mcg TWO puffs in the morning and evening. Rinse mouth out after use to prevent thrush --ORDER Airsupra 1-2 puffs AS NEEDED every 4 hours --CONTINUE pulmonary rehab  Low-normal BP --Echocardiogram scheduled

## 2022-09-17 NOTE — Progress Notes (Signed)
Daily Session Note  Patient Details  Name: Cheryl Wright MRN: [id number] Date of Birth: [birth year] Referring Provider:    Encounter Date: 09/17/2022  Check In:  Session Check In - 09/17/22 1204       Check-In   Supervising physician immediately available to respond to emergencies [facility] MD immediately available    Physician(s) [place] Crane, PA    [facility]    Staff Present Janine Ores, RN, Quentin Ore, MS, ACSM-CEP, Exercise Physiologist;Samantha [place], RD, LDN;Randi Olen Cordial BS, ACSM-CEP, Exercise Physiologist;Geraldo Haris Tamala Julian, RT    Virtual Visit No    Medication changes reported     No    Fall or balance concerns reported    No    Tobacco Cessation No Change    Warm-up and Cool-down Performed as group-led instruction    Resistance Training Performed Yes    VAD Patient? No    PAD/SET Patient? No      Pain Assessment   Currently in Pain? No/denies    Multiple Pain Sites No             Capillary Blood Glucose: No results found for this or any previous visit (from the past 24 hour(s)).    Social History   Tobacco Use  Smoking Status Former   Years: 2   Types: Cigarettes  Smokeless Tobacco Never    Goals Met:  Proper associated with RPD/PD & O2 Sat Independence with exercise equipment Exercise tolerated well No report of concerns or symptoms today Strength training completed today  Goals Unmet:  Not Applicable  Comments: Service time is from 1026 to 1144.    Dr. Rodman Pickle is Medical Director for Pulmonary Rehab at [facility].

## 2022-09-17 NOTE — Progress Notes (Signed)
Full PFT Performed Today  

## 2022-09-17 NOTE — Patient Instructions (Signed)
Full PFT Performed Today  

## 2022-09-18 ENCOUNTER — Ambulatory Visit (INDEPENDENT_AMBULATORY_CARE_PROVIDER_SITE_OTHER): Payer: BC Managed Care – PPO

## 2022-09-18 DIAGNOSIS — I959 Hypotension, unspecified: Secondary | ICD-10-CM

## 2022-09-18 LAB — ECHOCARDIOGRAM COMPLETE
Area-P 1/2: 3.77 cm2
S' Lateral: 3.03 cm

## 2022-09-19 ENCOUNTER — Encounter (HOSPITAL_BASED_OUTPATIENT_CLINIC_OR_DEPARTMENT_OTHER): Payer: Self-pay | Admitting: Pulmonary Disease

## 2022-09-19 ENCOUNTER — Encounter (HOSPITAL_COMMUNITY)
Admission: RE | Admit: 2022-09-19 | Discharge: 2022-09-19 | Disposition: A | Discharge status: Home / Self Care | Payer: BC Managed Care – PPO | Source: Ambulatory Visit | Attending: Obstetrics and Gynecology | Admitting: Obstetrics and Gynecology

## 2022-09-19 DIAGNOSIS — U099 Post covid-19 condition, unspecified: Secondary | ICD-10-CM

## 2022-09-19 DIAGNOSIS — R0602 Shortness of breath: Secondary | ICD-10-CM

## 2022-09-19 NOTE — Progress Notes (Signed)
Daily Session Note  Patient Details  Name: Cheryl Wright MRN: [id number] Date of Birth: 04-13-1976 Referring Provider:    Encounter Date: 09/19/2022  Check In:  Session Check In - 09/19/22 1137       Check-In   Supervising physician immediately available to respond to emergencies [facility] MD immediately available    Physician(s) Gala Romney    [facility]    Staff Present Janine Ores, RN, Quentin Ore, MS, ACSM-CEP, Exercise Physiologist;Samantha [place], RD, LDN;Randi Yevonne Pax, ACSM-CEP, Exercise Physiologist;Giannina Bartolome Benjamine Mola, MS, Exercise Physiologist;Johnny Starleen Blue, MS, Exercise Physiologist    Virtual Visit No    Medication changes reported     No    Fall or balance concerns reported    No    Tobacco Cessation No Change    Warm-up and Cool-down Performed as group-led instruction    Resistance Training Performed Yes    VAD Patient? No    PAD/SET Patient? No      Pain Assessment   Currently in Pain? No/denies    Pain Score 0-No pain    Multiple Pain Sites No             Capillary Blood Glucose: No results found for this or any previous visit (from the past 24 hour(s)).    Social History   Tobacco Use  Smoking Status Former   Years: 2   Types: Cigarettes  Smokeless Tobacco Never    Goals Met:  Proper associated with RPD/PD & O2 Sat Independence with exercise equipment Exercise tolerated well No report of concerns or symptoms today Strength training completed today  Goals Unmet:  Not Applicable  Comments: Service time is from 1014 to 1145.    Dr. Rodman Pickle is Medical Director for Pulmonary Rehab at [facility].

## 2022-09-24 ENCOUNTER — Encounter (HOSPITAL_COMMUNITY)
Admission: RE | Admit: 2022-09-24 | Discharge: 2022-09-24 | Disposition: A | Discharge status: Home / Self Care | Payer: BC Managed Care – PPO | Source: Ambulatory Visit | Attending: Obstetrics and Gynecology | Admitting: Obstetrics and Gynecology

## 2022-09-24 VITALS — Wt 161.6 lb

## 2022-09-24 DIAGNOSIS — R0602 Shortness of breath: Secondary | ICD-10-CM | POA: Insufficient documentation

## 2022-09-24 DIAGNOSIS — U099 Post covid-19 condition, unspecified: Secondary | ICD-10-CM

## 2022-09-24 NOTE — Progress Notes (Signed)
Daily Session Note  Patient Details  Name: Cheryl Wright MRN: [id number] Date of Birth: 1976/02/24 Referring Provider:    Encounter Date: 09/24/2022  Check In:  Session Check In - 09/24/22 1035       Check-In   Supervising physician immediately available to respond to emergencies [facility] MD immediately available    Physician(s) Ambrose Pancoast, NP    [facility]    Staff Present Elmon Else, MS, ACSM-CEP, Exercise Physiologist;Samantha [place], RD, LDN;Randi Yevonne Pax, ACSM-CEP, Exercise Physiologist;Harris Kistler Lazarus Salines, RN, BSN    Virtual Visit No    Medication changes reported     No    Fall or balance concerns reported    No    Tobacco Cessation No Change    Warm-up and Cool-down Performed as group-led instruction    Resistance Training Performed Yes    VAD Patient? No    PAD/SET Patient? No      Pain Assessment   Currently in Pain? No/denies    Multiple Pain Sites No             Capillary Blood Glucose: No results found for this or any previous visit (from the past 24 hour(s)).   Exercise Prescription Changes - 09/24/22 1500       Response to Exercise   Blood Pressure (Admit) 108/69    Blood Pressure (Exercise) 96/76    Blood Pressure (Exit) 94/60    Heart Rate (Admit) 97 bpm    Heart Rate (Exercise) 130 bpm    Heart Rate (Exit) 89 bpm    Oxygen Saturation (Admit) 96 %    Oxygen Saturation (Exercise) 95 %    Oxygen Saturation (Exit) 97 %    Rating of Perceived Exertion (Exercise) 12    Perceived Dyspnea (Exercise) 3    Duration Continue with 30 min of aerobic exercise without signs/symptoms of physical distress.    Intensity THRR unchanged      Progression   Progression Continue to progress workloads to maintain intensity without signs/symptoms of physical distress.      Resistance Training   Weight blue bands    Reps 10-15    Time 10 Minutes      Interval Training   Interval Training Yes    Equipment Treadmill     Comments 85min @2mph /0.5, 68min @ 2.48mph/3%      Treadmill   MPH 2    Grade 3    Minutes 15    METs 3.36      Recumbant Elliptical   Level 2    Minutes 15    METs 5.6             Social History   Tobacco Use  Smoking Status Former   Years: 2   Types: Cigarettes  Smokeless Tobacco Never    Goals Met:  Proper associated with RPD/PD & O2 Sat Independence with exercise equipment Exercise tolerated well No report of concerns or symptoms today Strength training completed today  Goals Unmet:  Not Applicable  Comments: Service time is from 1023 to 1138.    Dr. Rodman Pickle is Medical Director for Pulmonary Rehab at [facility].

## 2022-09-25 ENCOUNTER — Encounter (HOSPITAL_BASED_OUTPATIENT_CLINIC_OR_DEPARTMENT_OTHER): Payer: Self-pay | Admitting: Pulmonary Disease

## 2022-09-26 ENCOUNTER — Encounter (HOSPITAL_COMMUNITY)
Admission: RE | Admit: 2022-09-26 | Discharge: 2022-09-26 | Disposition: A | Discharge status: Home / Self Care | Payer: BC Managed Care – PPO | Source: Ambulatory Visit | Attending: Obstetrics and Gynecology | Admitting: Obstetrics and Gynecology

## 2022-09-26 DIAGNOSIS — R0602 Shortness of breath: Secondary | ICD-10-CM

## 2022-09-26 DIAGNOSIS — U099 Post covid-19 condition, unspecified: Secondary | ICD-10-CM

## 2022-09-26 NOTE — Progress Notes (Signed)
Daily Session Note  Patient Details  Name: Deyanira Jheri Rourke MRN: [id number] Date of Birth: 1976/01/18 Referring Provider:    Encounter Date: 09/26/2022  Check In:  Session Check In - 09/26/22 1140       Check-In   Supervising physician immediately available to respond to emergencies [facility] MD immediately available    Physician(s) Odie Sera, NP    Location [facility]    Staff Present Samantha [place], RD, Perlie Mayo, RN, BSN;Randi Reeve BS, ACSM-CEP, Exercise Physiologist;Jetta Gilford Rile BS, ACSM-CEP, Exercise Physiologist;Kaylee Rosana Hoes, MS, ACSM-CEP, Exercise Physiologist;Allysia Ingles Tamala Julian, RT    Virtual Visit No    Medication changes reported     No    Tobacco Cessation No Change    Warm-up and Cool-down Performed as group-led instruction    Resistance Training Performed Yes    VAD Patient? No    PAD/SET Patient? No      Pain Assessment   Currently in Pain? No/denies    Multiple Pain Sites No             Capillary Blood Glucose: No results found for this or any previous visit (from the past 24 hour(s)).    Social History   Tobacco Use  Smoking Status Former   Years: 2   Types: Cigarettes  Smokeless Tobacco Never    Goals Met:  Proper associated with RPD/PD & O2 Sat Independence with exercise equipment Exercise tolerated well No report of concerns or symptoms today Strength training completed today  Goals Unmet:  Not Applicable  Comments: Service time is from 1018 to 1140.    Dr. Rodman Pickle is Medical Director for Pulmonary Rehab at [facility].

## 2022-10-01 ENCOUNTER — Encounter (HOSPITAL_COMMUNITY)
Admission: RE | Admit: 2022-10-01 | Discharge: 2022-10-01 | Disposition: A | Discharge status: Home / Self Care | Payer: BC Managed Care – PPO | Source: Ambulatory Visit | Attending: Obstetrics and Gynecology | Admitting: Obstetrics and Gynecology

## 2022-10-01 DIAGNOSIS — R0602 Shortness of breath: Secondary | ICD-10-CM

## 2022-10-01 DIAGNOSIS — U099 Post covid-19 condition, unspecified: Secondary | ICD-10-CM

## 2022-10-01 NOTE — Telephone Encounter (Signed)
Dr. Everardo All, please advise. Pt is requesting an excusal letter for jury duty as it will directly interfere with her pulmonary rehab schedule. Thanks.

## 2022-10-01 NOTE — Progress Notes (Signed)
Daily Session Note  Patient Details  Name: Cheryl Wright MRN: [id number] Date of Birth: [birth year] Referring Provider:    Encounter Date: 10/01/2022  Check In:  Session Check In - 10/01/22 1207       Check-In   Supervising physician immediately available to respond to emergencies [facility] MD immediately available    Physician(s) Oris Drone, NP    Location [facility]    Staff Present Samantha Bel[place], RD, Dutch Gray, RN, BSN;Rubyann Lingle BS, ACSM-CEP, Exercise Physiologist;Kaylee Earlene Plater, MS, ACSM-CEP, Exercise Physiologist;Casey Marcille Buffy, RN, BSN    Virtual Visit No    Medication changes reported     No    Fall or balance concerns reported    No    Tobacco Cessation No Change    Warm-up and Cool-down Performed as group-led instruction    Resistance Training Performed Yes    VAD Patient? No    PAD/SET Patient? No      Pain Assessment   Currently in Pain? No/denies             Capillary Blood Glucose: No results found for this or any previous visit (from the past 24 hour(s)).    Social History   Tobacco Use  Smoking Status Former   Years: 2   Types: Cigarettes  Smokeless Tobacco Never    Goals Met:  Independence with exercise equipment Improved SOB with ADL's Exercise tolerated well No report of concerns or symptoms today Strength training completed today  Goals Unmet:  Not Applicable  Comments: Service time is from 1022 to 1140.    Dr. Mechele Collin is Medical Director for Pulmonary Rehab at [facility]

## 2022-10-02 NOTE — Progress Notes (Signed)
Pulmonary Individual Treatment Plan  Patient Details  Name: Cheryl Wright MRN: [id number] Date of Birth: 25-Oct-1975 Referring Provider:    Initial Encounter Date:   Visit Diagnosis: Shortness of breath  COVID-19 long hauler  Patient's Home Medications on Admission:   Current Outpatient Medications:    albuterol (PROVENTIL) (2.5 MG/3ML) 0.083% nebulizer solution, Take 3 mLs (2.5 mg total) by nebulization every 6 (six) hours as needed for wheezing or shortness of breath., Disp: 75 mL, Rfl: 2   albuterol (VENTOLIN HFA) 108 (90 Base) MCG/ACT inhaler, SMARTSIG:1 Puff(s) Via Inhaler Every 4 Hours PRN, Disp: , Rfl:    Albuterol-Budesonide (AIRSUPRA) 90-80 MCG/ACT AERO, Inhale 2 puffs into the lungs every 4 (four) hours as needed., Disp: 10.7 g, Rfl: 5   budesonide-formoterol (SYMBICORT) 160-4.5 MCG/ACT inhaler, Inhale 2 puffs into the lungs in the morning and at bedtime., Disp: 1 each, Rfl: 3   fluticasone (FLONASE) 50 MCG/ACT nasal spray, Place 1 spray into both nostrils daily., Disp: , Rfl:    ibuprofen (ADVIL,MOTRIN) 800 MG tablet, Take 1 tablet (800 mg total) by mouth every 8 (eight) hours as needed for pain., Disp: 40 tablet, Rfl: 1   loratadine (CLARITIN) 10 MG tablet, Take 10 mg by mouth daily., Disp: , Rfl:    norgestimate-ethinyl estradiol (ORTHO-CYCLEN) 0.25-35 MG-MCG tablet, Take 1 tablet by mouth daily., Disp: , Rfl:    sertraline (ZOLOFT) 50 MG tablet, Take 50 mg by mouth daily., Disp: , Rfl:   Past Medical History: Past Medical History:  Diagnosis Date   Blood transfusion without reported diagnosis    pp hemorrhage 2011 twin delivery   Endometriosis    Frequent UTI    Headache(784.0)    migraine   Hx of preeclampsia, prior pregnancy, currently pregnant    Normal pregnancy 06/01/2012   SVD (spontaneous vaginal delivery) 06/02/2012    Tobacco Use: Social History   Tobacco Use  Smoking Status Former   Years: 2   Types: Cigarettes  Smokeless Tobacco Never     Labs: Review Flowsheet        No data to display          Capillary Blood Glucose: No results found for: "GLUCAP"   Pulmonary Assessment Scores:  Pulmonary Assessment Scores     Row Name 08/30/22 1109         ADL UCSD   ADL Phase Entry     SOB Score total 86       CAT Score   CAT Score 31             UCSD: Self-administered rating of dyspnea associated with activities of daily living (ADLs) 6-point scale (0 = "not at all" to 5 = "maximal or unable to do because of breathlessness")  Scoring Scores range from 0 to 120.  Minimally important difference is 5 units  CAT: CAT can identify the health impairment of COPD patients and is better correlated with disease progression.  CAT has a scoring range of zero to 40. The CAT score is classified into four groups of low (less than 10), medium (10 - 20), high (21-30) and very high (31-40) based on the impact level of disease on health status. A CAT score over 10 suggests significant symptoms.  A worsening CAT score could be explained by an exacerbation, poor medication adherence, poor inhaler technique, or progression of COPD or comorbid conditions.  CAT MCID is 2 points  mMRC: mMRC (Modified Medical Research Council) Dyspnea Scale is used to assess the  degree of baseline functional disability in patients of respiratory disease due to dyspnea. No minimal important difference is established. A decrease in score of 1 point or greater is considered a positive change.   Pulmonary Function Assessment:   Exercise Target Goals: Exercise Program Goal: Individual exercise prescription set using results from initial 6 min walk test and THRR while considering  patient's activity barriers and safety.   Exercise Prescription Goal: Initial exercise prescription builds to 30-45 minutes a day of aerobic activity, 2-3 days per week.  Home exercise guidelines will be given to patient during program as part of exercise prescription  that the participant will acknowledge.  Activity Barriers & Risk Stratification:  Activity Barriers & Cardiac Risk Stratification - 08/30/22 1050       Activity Barriers & Cardiac Risk Stratification   Activity Barriers Shortness of Breath;Deconditioning;Muscular Weakness;Joint Problems             6 Minute Walk:  6 Minute Walk     Row Name 08/30/22 1138         6 Minute Walk   Phase Initial     Distance 1080 feet     Walk Time 6 minutes     # of Rest Breaks 0     MPH 2.05     METS 3.85     RPE 12     Perceived Dyspnea  1.5     VO2 Peak 13.48     Symptoms No     Resting HR 102 bpm     Resting BP 98/72     Resting Oxygen Saturation  97 %     Exercise Oxygen Saturation  during 6 min walk 95 %     Max Ex. HR 105 bpm     Max Ex. BP 102/66     2 Minute Post BP 100/62       Interval HR   1 Minute HR 102     2 Minute HR 101     3 Minute HR 100     4 Minute HR 102     5 Minute HR 102     6 Minute HR 105     2 Minute Post HR 88     Interval Heart Rate? Yes       Interval Oxygen   Interval Oxygen? Yes     Baseline Oxygen Saturation % 97 %     1 Minute Oxygen Saturation % 96 %     1 Minute Liters of Oxygen 0 L     2 Minute Oxygen Saturation % 95 %     2 Minute Liters of Oxygen 0 L     3 Minute Oxygen Saturation % 95 %     3 Minute Liters of Oxygen 0 L     4 Minute Oxygen Saturation % 95 %     4 Minute Liters of Oxygen 0 L     5 Minute Oxygen Saturation % 96 %     5 Minute Liters of Oxygen 0 L     6 Minute Oxygen Saturation % 95 %     6 Minute Liters of Oxygen 0 L     2 Minute Post Oxygen Saturation % 96 %     2 Minute Post Liters of Oxygen 0 L              Oxygen Initial Assessment:  Oxygen Initial Assessment - 08/30/22 1053       Home Oxygen  Home Oxygen Device None    Sleep Oxygen Prescription None    Home Exercise Oxygen Prescription None    Home Resting Oxygen Prescription None      Initial 6 min Walk   Oxygen Used None      Program  Oxygen Prescription   Program Oxygen Prescription None      Intervention   Short Term Goals To learn and understand importance of maintaining oxygen saturations>88%;To learn and demonstrate proper use of respiratory medications;To learn and understand importance of monitoring SPO2 with pulse oximeter and demonstrate accurate use of the pulse oximeter.;To learn and demonstrate proper pursed lip breathing techniques or other breathing techniques.     Long  Term Goals Maintenance of O2 saturations>88%;Compliance with respiratory medication;Verbalizes importance of monitoring SPO2 with pulse oximeter and return demonstration;Exhibits proper breathing techniques, such as pursed lip breathing or other method taught during program session;Demonstrates proper use of MDI's             Oxygen Re-Evaluation:  Oxygen Re-Evaluation     Row Name 08/30/22 1353 09/24/22 0906           Program Oxygen Prescription   Program Oxygen Prescription None None        Home Oxygen   Home Oxygen Device None None      Sleep Oxygen Prescription None None      Home Exercise Oxygen Prescription None None      Home Resting Oxygen Prescription None None        Goals/Expected Outcomes   Short Term Goals To learn and understand importance of maintaining oxygen saturations>88%;To learn and demonstrate proper use of respiratory medications;To learn and understand importance of monitoring SPO2 with pulse oximeter and demonstrate accurate use of the pulse oximeter.;To learn and demonstrate proper pursed lip breathing techniques or other breathing techniques.  To learn and understand importance of maintaining oxygen saturations>88%;To learn and demonstrate proper use of respiratory medications;To learn and understand importance of monitoring SPO2 with pulse oximeter and demonstrate accurate use of the pulse oximeter.;To learn and demonstrate proper pursed lip breathing techniques or other breathing techniques.       Long   Term Goals Maintenance of O2 saturations>88%;Compliance with respiratory medication;Verbalizes importance of monitoring SPO2 with pulse oximeter and return demonstration;Exhibits proper breathing techniques, such as pursed lip breathing or other method taught during program session;Demonstrates proper use of MDI's Maintenance of O2 saturations>88%;Compliance with respiratory medication;Verbalizes importance of monitoring SPO2 with pulse oximeter and return demonstration;Exhibits proper breathing techniques, such as pursed lip breathing or other method taught during program session;Demonstrates proper use of MDI's      Goals/Expected Outcomes Compliance and understanding of oxygen saturation monitoring and breathing techniques to decrease shortness of breath. Compliance and understanding of oxygen saturation monitoring and breathing techniques to decrease shortness of breath.               Oxygen Discharge (Final Oxygen Re-Evaluation):  Oxygen Re-Evaluation - 09/24/22 0906       Program Oxygen Prescription   Program Oxygen Prescription None      Home Oxygen   Home Oxygen Device None    Sleep Oxygen Prescription None    Home Exercise Oxygen Prescription None    Home Resting Oxygen Prescription None      Goals/Expected Outcomes   Short Term Goals To learn and understand importance of maintaining oxygen saturations>88%;To learn and demonstrate proper use of respiratory medications;To learn and understand importance of monitoring SPO2 with pulse oximeter and demonstrate accurate  use of the pulse oximeter.;To learn and demonstrate proper pursed lip breathing techniques or other breathing techniques.     Long  Term Goals Maintenance of O2 saturations>88%;Compliance with respiratory medication;Verbalizes importance of monitoring SPO2 with pulse oximeter and return demonstration;Exhibits proper breathing techniques, such as pursed lip breathing or other method taught during program  session;Demonstrates proper use of MDI's    Goals/Expected Outcomes Compliance and understanding of oxygen saturation monitoring and breathing techniques to decrease shortness of breath.             Initial Exercise Prescription:   Perform Capillary Blood Glucose checks as needed.  Exercise Prescription Changes:   Exercise Prescription Changes     Row Name 09/10/22 1200 09/24/22 1500           Response to Exercise   Blood Pressure (Admit) 94/62 108/69      Blood Pressure (Exercise) 124/72 96/76      Blood Pressure (Exit) 87/60 94/60      Heart Rate (Admit) 105 bpm 97 bpm      Heart Rate (Exercise) 96 bpm 130 bpm      Heart Rate (Exit) 88 bpm 89 bpm      Oxygen Saturation (Admit) 95 % 96 %      Oxygen Saturation (Exercise) 96 % 95 %      Oxygen Saturation (Exit) 98 % 97 %      Rating of Perceived Exertion (Exercise) 12 12      Perceived Dyspnea (Exercise) 3 3      Duration Progress to 30 minutes of  aerobic without signs/symptoms of physical distress Continue with 30 min of aerobic exercise without signs/symptoms of physical distress.      Intensity THRR unchanged THRR unchanged        Progression   Progression Continue to progress workloads to maintain intensity without signs/symptoms of physical distress. Continue to progress workloads to maintain intensity without signs/symptoms of physical distress.        Resistance Training   Weight --  blue bands blue bands      Reps 10-15 10-15      Time 10 Minutes 10 Minutes        Interval Training   Interval Training -- Yes      Equipment -- Treadmill      Comments -- @2mph /0.5, @ 2.69mph/3%        Treadmill   MPH 2 2      Grade 0.5 3      Minutes 15 15      METs -- 3.36        Recumbant Elliptical   Level 2 2      Minutes 15 15      METs 4.1 5.6               Exercise Comments:   Exercise Comments     Row Name 09/05/22 1221           Exercise Comments Pt completed first day of exercise.  Cheryl Wright exercised for 15 min on the recumbent elliptical and treadmill. She averaged 2.8 METs at level 1 on the recumbent elliptical and 2.53 MET on the treadmill. Cheryl Wright performed the warmup and cooldown standing without limitations.                Exercise Goals and Review:   Exercise Goals     Row Name 08/30/22 1052 08/30/22 1352 09/24/22 0906         Exercise  Goals   Increase Physical Activity Yes Yes Yes     Intervention Provide advice, education, support and counseling about physical activity/exercise needs.;Develop an individualized exercise prescription for aerobic and resistive training based on initial evaluation findings, risk stratification, comorbidities and participant's personal goals. Provide advice, education, support and counseling about physical activity/exercise needs.;Develop an individualized exercise prescription for aerobic and resistive training based on initial evaluation findings, risk stratification, comorbidities and participant's personal goals. Provide advice, education, support and counseling about physical activity/exercise needs.;Develop an individualized exercise prescription for aerobic and resistive training based on initial evaluation findings, risk stratification, comorbidities and participant's personal goals.     Expected Outcomes Short Term: Attend rehab on a regular basis to increase amount of physical activity.;Long Term: Exercising regularly at least 3-5 days a week.;Long Term: Add in home exercise to make exercise part of routine and to increase amount of physical activity. Short Term: Attend rehab on a regular basis to increase amount of physical activity.;Long Term: Exercising regularly at least 3-5 days a week.;Long Term: Add in home exercise to make exercise part of routine and to increase amount of physical activity. Short Term: Attend rehab on a regular basis to increase amount of physical activity.;Long Term: Exercising regularly at least 3-5  days a week.;Long Term: Add in home exercise to make exercise part of routine and to increase amount of physical activity.     Increase Strength and Stamina Yes Yes Yes     Intervention Provide advice, education, support and counseling about physical activity/exercise needs.;Develop an individualized exercise prescription for aerobic and resistive training based on initial evaluation findings, risk stratification, comorbidities and participant's personal goals. Provide advice, education, support and counseling about physical activity/exercise needs.;Develop an individualized exercise prescription for aerobic and resistive training based on initial evaluation findings, risk stratification, comorbidities and participant's personal goals. Provide advice, education, support and counseling about physical activity/exercise needs.;Develop an individualized exercise prescription for aerobic and resistive training based on initial evaluation findings, risk stratification, comorbidities and participant's personal goals.     Expected Outcomes Short Term: Increase workloads from initial exercise prescription for resistance, speed, and METs.;Short Term: Perform resistance training exercises routinely during rehab and add in resistance training at home;Long Term: Improve cardiorespiratory fitness, muscular endurance and strength as measured by increased METs and functional capacity ( ) Short Term: Increase workloads from initial exercise prescription for resistance, speed, and METs.;Short Term: Perform resistance training exercises routinely during rehab and add in resistance training at home;Long Term: Improve cardiorespiratory fitness, muscular endurance and strength as measured by increased METs and functional capacity ( ) Short Term: Increase workloads from initial exercise prescription for resistance, speed, and METs.;Short Term: Perform resistance training exercises routinely during rehab and add in resistance  training at home;Long Term: Improve cardiorespiratory fitness, muscular endurance and strength as measured by increased METs and functional capacity ( )     Able to understand and use rate of perceived exertion (RPE) scale Yes Yes Yes     Intervention Provide education and explanation on how to use RPE scale Provide education and explanation on how to use RPE scale Provide education and explanation on how to use RPE scale     Expected Outcomes Short Term: Able to use RPE daily in rehab to express subjective intensity level;Long Term:  Able to use RPE to guide intensity level when exercising independently Short Term: Able to use RPE daily in rehab to express subjective intensity level;Long Term:  Able to use RPE to guide intensity level when exercising  independently Short Term: Able to use RPE daily in rehab to express subjective intensity level;Long Term:  Able to use RPE to guide intensity level when exercising independently     Able to understand and use Dyspnea scale Yes Yes Yes     Intervention Provide education and explanation on how to use Dyspnea scale Provide education and explanation on how to use Dyspnea scale Provide education and explanation on how to use Dyspnea scale     Expected Outcomes Short Term: Able to use Dyspnea scale daily in rehab to express subjective sense of shortness of breath during exertion;Long Term: Able to use Dyspnea scale to guide intensity level when exercising independently Short Term: Able to use Dyspnea scale daily in rehab to express subjective sense of shortness of breath during exertion;Long Term: Able to use Dyspnea scale to guide intensity level when exercising independently Short Term: Able to use Dyspnea scale daily in rehab to express subjective sense of shortness of breath during exertion;Long Term: Able to use Dyspnea scale to guide intensity level when exercising independently     Knowledge and understanding of Target Heart Rate Range (THRR) Yes Yes Yes      Intervention Provide education and explanation of THRR including how the numbers were predicted and where they are located for reference Provide education and explanation of THRR including how the numbers were predicted and where they are located for reference Provide education and explanation of THRR including how the numbers were predicted and where they are located for reference     Expected Outcomes Short Term: Able to state/look up THRR;Long Term: Able to use THRR to govern intensity when exercising independently;Short Term: Able to use daily as guideline for intensity in rehab Short Term: Able to state/look up THRR;Long Term: Able to use THRR to govern intensity when exercising independently;Short Term: Able to use daily as guideline for intensity in rehab Short Term: Able to state/look up THRR;Long Term: Able to use THRR to govern intensity when exercising independently;Short Term: Able to use daily as guideline for intensity in rehab     Able to check pulse independently -- -- --     Intervention -- -- --     Expected Outcomes -- -- --     Understanding of Exercise Prescription Yes Yes Yes     Intervention Provide education, explanation, and written materials on patient's individual exercise prescription Provide education, explanation, and written materials on patient's individual exercise prescription Provide education, explanation, and written materials on patient's individual exercise prescription     Expected Outcomes Short Term: Able to explain program exercise prescription;Long Term: Able to explain home exercise prescription to exercise independently Short Term: Able to explain program exercise prescription;Long Term: Able to explain home exercise prescription to exercise independently Short Term: Able to explain program exercise prescription;Long Term: Able to explain home exercise prescription to exercise independently              Exercise Goals Re-Evaluation :  Exercise Goals  Re-Evaluation     Row Name 08/30/22 1352 09/26/22 0651           Exercise Goal Re-Evaluation   Exercise Goals Review Increase Physical Activity;Able to understand and use Dyspnea scale;Understanding of Exercise Prescription;Increase Strength and Stamina;Knowledge and understanding of Target Heart Rate Range (THRR);Able to understand and use rate of perceived exertion (RPE) scale;Able to check pulse independently Increase Physical Activity;Able to understand and use Dyspnea scale;Understanding of Exercise Prescription;Increase Strength and Stamina;Knowledge and understanding of Target Heart Rate  Range (THRR);Able to understand and use rate of perceived exertion (RPE) scale;Able to check pulse independently      Comments Patient is scheduled to begin exercise next week. Will continue to monitor and progress as able. Cheryl Wright has completed 6 exercise sessions. She exercises for 15 min on the recumbent elliptical and treadmill. She averages 5.6 METs at level 2 on the recumbent elliptical and 3.48 METs on the treadmill. Cheryl Wright performs the warmup and cooldown standing without limitations. Cheryl Wright has increased her workload for both exercise modes. She tolereates progressions well. METs have also increased for both exercise modes. Cheryl Wright has started doing interval training on the treadmill. Cheryl Wright does 1 min of 2.1 mph with 0.5% incline and 2.1 mph with 3.0% incline. Both intervals are somewhat easy for her. Will adjust intervals to increase intensity slightly. Cheryl Wright is very motivated to exercise and always has a postive attitude. Will continue to monitor and progress as able.      Expected Outcomes Through exercise at rehab and home, the patient will decrease shortness of breath with daily activities and feel confident in carrying out an exercise regimen at home. Through exercise at rehab and home, the patient will decrease shortness of breath with daily activities and feel confident in carrying out an exercise  regimen at home.               Discharge Exercise Prescription (Final Exercise Prescription Changes):  Exercise Prescription Changes - 09/24/22 1500       Response to Exercise   Blood Pressure (Admit) 108/69    Blood Pressure (Exercise) 96/76    Blood Pressure (Exit) 94/60    Heart Rate (Admit) 97 bpm    Heart Rate (Exercise) 130 bpm    Heart Rate (Exit) 89 bpm    Oxygen Saturation (Admit) 96 %    Oxygen Saturation (Exercise) 95 %    Oxygen Saturation (Exit) 97 %    Rating of Perceived Exertion (Exercise) 12    Perceived Dyspnea (Exercise) 3    Duration Continue with 30 min of aerobic exercise without signs/symptoms of physical distress.    Intensity THRR unchanged      Progression   Progression Continue to progress workloads to maintain intensity without signs/symptoms of physical distress.      Resistance Training   Weight blue bands    Reps 10-15    Time 10 Minutes      Interval Training   Interval Training Yes    Equipment Treadmill    Comments @2mph /0.5, @ 2.42mph/3%      Treadmill   MPH 2    Grade 3    Minutes 15    METs 3.36      Recumbant Elliptical   Level 2    Minutes 15    METs 5.6             Nutrition:  Target Goals: Understanding of nutrition guidelines, daily intake of sodium 1500mg , cholesterol 200mg , calories 30% from fat and 7% or less from saturated fats, daily to have 5 or more servings of fruits and vegetables.  Biometrics:  Pre Biometrics - 08/30/22 1035       Pre Biometrics   Grip Strength 18 kg              Nutrition Therapy Plan and Nutrition Goals:  Nutrition Therapy & Goals - 10/01/22 1448       Nutrition Therapy   Diet General Healthful Diet      Personal  Nutrition Goals   Nutrition Goal Patient to improve diet quality by using the plate method as a guide for meal planning to include lean protein/plant protein, fruits, vegetables, whole grains, nonfat dairy as part of a well-balanced diet.     Comments Goals in action. She is trying to eat 3-4 smaller meals daily as she describes increased shortness of breath with larger meals. She is trying to increase protein to aid with lean muscle mass; she has implemented a protein shake daily ([employer]). Have reviewd the Mediterranean diet to aid with introducing/increasing anti-inflammatory foods. Cheryl Wright will follow-up with cardiology in May regarding low blood pressures; she reports adequate fluid intake. Cheryl Wright will continue to benefit from participation in pulmonary rehab for nutrition exercise and lfiestyle modification.      Intervention Plan   Intervention Prescribe, educate and counsel regarding individualized specific dietary modifications aiming towards targeted core components such as weight, hypertension, lipid management, diabetes, heart failure and other comorbidities.;Nutrition handout(s) given to patient.    Expected Outcomes Short Term Goal: Understand basic principles of dietary content, such as calories, fat, sodium, cholesterol and nutrients.;Long Term Goal: Adherence to prescribed nutrition plan.             Nutrition Assessments:  Nutrition Assessments - 09/05/22 1126       Rate Your Plate Scores   Pre Score 69            MEDIFICTS Score Key: ?70 Need to make dietary changes  40-70 Heart Healthy Diet ? 40 Therapeutic Level Cholesterol Diet  Flowsheet Row Pulmonary Rehab Walk [facility]  Picture Your Plate Total Score on Admission 69      Picture Your Plate Scores: <40<40 Unhealthy dietary pattern with much room for improvement. 41-50 Dietary pattern unlikely to meet recommendations for good health and room for improvement. 51-60 More healthful dietary pattern, with some room for improvement.  >60 Healthy dietary pattern, although there may be some specific behaviors that could be improved.    Nutrition Goals Re-Evaluation:   Nutrition Goals Re-Evaluation     Row Name 09/05/22 1220 10/01/22 1448           Goals   Current Weight 159 lb 13.3 oz (72.5 kg) 160 lb 7.9 oz (72.8 kg)      Comment lipids WNL labs WNL      Expected Outcome Cheryl Wright reports improved appetite over the last two weeks. She is trying to eat 3-4 smaller meals daily as she describes increased shortness of breath with larger meals. She is trying to increase protein to aid with lean muscle mass. Will continue to discuss protein needs/intake and anti-inflammatory foods/mediteranean diet. Cheryl Wright will continue to benefit from participation in pulmonary rehab for nutrition exercise and lfiestyle modification. Goals in action. She is trying to eat 3-4 smaller meals daily as she describes increased shortness of breath with larger meals. She is trying to increase protein to aid with lean muscle mass; she has implemented a protein shake daily ([employer]). Have reviewd the Mediterranean diet to aid with introducing/increasing anti-inflammatory foods. Cheryl Wright will follow-up with cardiology in May regarding low blood pressures; she reports adequate fluid intake. Cheryl Wright will continue to benefit from participation in pulmonary rehab for nutrition exercise and lfiestyle modification.               Nutrition Goals Discharge (Final Nutrition Goals Re-Evaluation):  Nutrition Goals Re-Evaluation - 10/01/22 1448  Goals   Current Weight 160 lb 7.9 oz (72.8 kg)    Comment labs WNL    Expected Outcome Goals in action. She is trying to eat 3-4 smaller meals daily as she describes increased shortness of breath with larger meals. She is trying to increase protein to aid with lean muscle mass; she has implemented a protein shake daily ([employer]). Have reviewd the Mediterranean diet to aid with introducing/increasing anti-inflammatory foods. Cheryl Wright will follow-up with cardiology in May regarding low blood pressures; she reports adequate fluid intake. Cheryl Wright will  continue to benefit from participation in pulmonary rehab for nutrition exercise and lfiestyle modification.             Psychosocial: Target Goals: Acknowledge presence or absence of significant depression and/or stress, maximize coping skills, provide positive support system. Participant is able to verbalize types and ability to use techniques and skills needed for reducing stress and depression.  Initial Review & Psychosocial Screening:  Initial Psych Review & Screening - 08/30/22 1047       Initial Review   Current issues with Current Anxiety/Panic;Current Psychotropic Meds      Family Dynamics   Good Support System? Yes    Comments anxiety managed with meds      Barriers   Psychosocial barriers to participate in program There are no identifiable barriers or psychosocial needs.      Screening Interventions   Interventions Encouraged to exercise             Quality of Life Scores:  Scores of 19 and below usually indicate a poorer quality of life in these areas.  A difference of  2-3 points is a clinically meaningful difference.  A difference of 2-3 points in the total score of the Quality of Life Index has been associated with significant improvement in overall quality of life, self-image, physical symptoms, and general health in studies assessing change in quality of life.  PHQ-9: Review Flowsheet       08/30/2022  Depression screen PHQ 2/9  Decreased Interest 0  Down, Depressed, Hopeless 0  PHQ - 2 Score 0  Altered sleeping 0  Tired, decreased energy 0  Change in appetite 0  Feeling bad or failure about yourself  0  Trouble concentrating 0  Moving slowly or fidgety/restless 0  Suicidal thoughts 0  PHQ-9 Score 0  Difficult doing work/chores Somewhat difficult   Interpretation of Total Score  Total Score Depression Severity:  1-4 = Minimal depression, 5-9 = Mild depression, 10-14 = Moderate depression, 15-19 = Moderately severe depression, 20-27 = Severe  depression   Psychosocial Evaluation and Intervention:  Psychosocial Evaluation - 08/30/22 1205       Psychosocial Evaluation & Interventions   Interventions Encouraged to exercise with the program and follow exercise prescription    Comments Pt denies any psychosocial or barriers concerns    Expected Outcomes Pt will partcipate in PR without any psychosocial barriers or concerns    Continue Psychosocial Services  No Follow up required             Psychosocial Re-Evaluation:  Psychosocial Re-Evaluation     Row Name 08/30/22 1401 09/18/22 1527           Psychosocial Re-Evaluation   Current issues with Current Anxiety/Panic;Current Psychotropic Meds Current Anxiety/Panic;Current Psychotropic Meds      Comments Pt has not started PR yet Pt denies any psychosocial barriers or concerns at this time      Expected Outcomes For pt  to participate in PR without any psychosocial barriers or concerns For pt to participate in PR without any psychosocial barriers or concerns      Interventions Encouraged to attend Pulmonary Rehabilitation for the exercise Encouraged to attend Pulmonary Rehabilitation for the exercise      Continue Psychosocial Services  No Follow up required No Follow up required               Psychosocial Discharge (Final Psychosocial Re-Evaluation):  Psychosocial Re-Evaluation - 09/18/22 1527       Psychosocial Re-Evaluation   Current issues with Current Anxiety/Panic;Current Psychotropic Meds    Comments Pt denies any psychosocial barriers or concerns at this time    Expected Outcomes For pt to participate in PR without any psychosocial barriers or concerns    Interventions Encouraged to attend Pulmonary Rehabilitation for the exercise    Continue Psychosocial Services  No Follow up required             Education: Education Goals: Education classes will be provided on a weekly basis, covering required topics. Participant will state understanding/return  demonstration of topics presented.  Learning Barriers/Preferences:  Learning Barriers/Preferences - 08/30/22 1049       Learning Barriers/Preferences   Learning Barriers None    Learning Preferences None             Education Topics: Introduction to Pulmonary Rehab Group instruction provided by PowerPoint, verbal discussion, and written material to support subject matter. Instructor reviews what Pulmonary Rehab is, the purpose of the program, and how patients are referred.     Know Your Numbers Group instruction that is supported by a PowerPoint presentation. Instructor discusses importance of knowing and understanding resting, exercise, and post-exercise oxygen saturation, heart rate, and blood pressure. Oxygen saturation, heart rate, blood pressure, rating of perceived exertion, and dyspnea are reviewed along with a normal range for these values.    Exercise for the Pulmonary Patient Group instruction that is supported by a PowerPoint presentation. Instructor discusses benefits of exercise, core components of exercise, frequency, duration, and intensity of an exercise routine, importance of utilizing pulse oximetry during exercise, safety while exercising, and options of places to exercise outside of rehab.  Flowsheet Row PULMONARY REHAB CHRONIC OBSTRUCTIVE PULMONARY DISEASE from 09/26/2022 in [facility]  Date 09/26/22  Educator EP  Instruction Review Code 1- Verbalizes Understanding          MET Level  Group instruction provided by PowerPoint, verbal discussion, and written material to support subject matter. Instructor reviews what METs are and how to increase METs.    Pulmonary Medications Verbally interactive group education provided by instructor with focus on inhaled medications and proper administration. Flowsheet Row PULMONARY REHAB CHRONIC OBSTRUCTIVE PULMONARY DISEASE from 09/19/2022 in [facility], [facility]  Date 09/19/22  Educator RT  Instruction Review Code 1- Verbalizes Understanding       Anatomy and Physiology of the Respiratory System Group instruction provided by PowerPoint, verbal discussion, and written material to support subject matter. Instructor reviews respiratory cycle and anatomical components of the respiratory system and their functions. Instructor also reviews differences in obstructive and restrictive respiratory diseases with examples of each.    Oxygen Safety Group instruction provided by PowerPoint, verbal discussion, and written material to support subject matter. There is an overview of "What is Oxygen" and "Why do we need it".  Instructor also reviews how to  create a safe environment for oxygen use, the importance of using oxygen as prescribed, and the risks of noncompliance. There is a brief discussion on traveling with oxygen and resources the patient may utilize.   Oxygen Use Group instruction provided by PowerPoint, verbal discussion, and written material to discuss how supplemental oxygen is prescribed and different types of oxygen supply systems. Resources for more information are provided.    Breathing Techniques Group instruction that is supported by demonstration and informational handouts. Instructor discusses the benefits of pursed lip and diaphragmatic breathing and detailed demonstration on how to perform both.     Risk Factor Reduction Group instruction that is supported by a PowerPoint presentation. Instructor discusses the definition of a risk factor, different risk factors for pulmonary disease, and how the heart and lungs work together.   MD Day A group question and answer session with a medical doctor that allows participants to ask questions that relate to their pulmonary disease state.   Nutrition for the Pulmonary Patient Group instruction provided by PowerPoint slides, verbal  discussion, and written materials to support subject matter. The instructor gives an explanation and review of healthy diet recommendations, which includes a discussion on weight management, recommendations for fruit and vegetable consumption, as well as protein, fluid, caffeine, fiber, sodium, sugar, and alcohol. Tips for eating when patients are short of breath are discussed.    Other Education Group or individual verbal, written, or video instructions that support the educational goals of the pulmonary rehab program.    Knowledge Questionnaire Score:  Knowledge Questionnaire Score - 08/30/22 1112       Knowledge Questionnaire Score   Pre Score 16/18             Core Components/Risk Factors/Patient Goals at Admission:  Personal Goals and Risk Factors at Admission - 08/30/22 1050       Core Components/Risk Factors/Patient Goals on Admission    Weight Management Weight Maintenance    Improve shortness of breath with ADL's Yes    Intervention Provide education, individualized exercise plan and daily activity instruction to help decrease symptoms of SOB with activities of daily living.    Expected Outcomes Short Term: Improve cardiorespiratory fitness to achieve a reduction of symptoms when performing ADLs    Increase knowledge of respiratory medications and ability to use respiratory devices properly  Yes    Intervention Provide education and demonstration as needed of appropriate use of medications, inhalers, and oxygen therapy.    Expected Outcomes Short Term: Achieves understanding of medications use. Understands that oxygen is a medication prescribed by physician. Demonstrates appropriate use of inhaler and oxygen therapy.;Long Term: Maintain appropriate use of medications, inhalers, and oxygen therapy.             Core Components/Risk Factors/Patient Goals Review:   Goals and Risk Factor Review     Row Name 08/30/22 1402 09/18/22 1531           Core Components/Risk  Factors/Patient Goals Review   Personal Goals Review Improve shortness of breath with ADL's;Develop more efficient breathing techniques such as purse lipped breathing and diaphragmatic breathing and practicing self-pacing with activity.;Increase knowledge of respiratory medications and ability to use respiratory devices properly. Develop more efficient breathing techniques such as purse lipped breathing and diaphragmatic breathing and practicing self-pacing with activity.;Increase knowledge of respiratory medications and ability to use respiratory devices properly.;Improve shortness of breath with ADL's      Review Pt is scheduled to begin exercise next week. Will continue  to monitor. Casandra has attended 4 sessions at this time. She is currently exercising on the Octane and the Treadmill. She has been able to increase her workload and METS on the Octane. She has also been able to increase her speed and incline on treadmill. Rhetta is able to report her RPE and dyspnea scores. Mattye enjoy's coming to class and feels like it is helping build her endurance.      Expected Outcomes See admission goals See admission Goals               Core Components/Risk Factors/Patient Goals at Discharge (Final Review):   Goals and Risk Factor Review - 09/18/22 1531       Core Components/Risk Factors/Patient Goals Review   Personal Goals Review Develop more efficient breathing techniques such as purse lipped breathing and diaphragmatic breathing and practicing self-pacing with activity.;Increase knowledge of respiratory medications and ability to use respiratory devices properly.;Improve shortness of breath with ADL's    Review Cheryl Wright has attended 4 sessions at this time. She is currently exercising on the Octane and the Treadmill. She has been able to increase her workload and METS on the Octane. She has also been able to increase her speed and incline on treadmill. Cheryl Wright is able to report her RPE and dyspnea  scores. Cheryl Wright enjoy's coming to class and feels like it is helping build her endurance.    Expected Outcomes See admission Goals             ITP Comments: Pt is making expected progress toward Pulmonary Rehab goals after completing 4 sessions. Recommend continued exercise, life style modification, education, and utilization of breathing techniques to increase stamina and strength, while also decreasing shortness of breath with exertion.  Dr. Mechele Collin is Medical Director for Pulmonary Rehab at [facility].     Comments: Dr. Mechele Collin is Medical Director for Pulmonary Rehab at [facility].

## 2022-10-03 ENCOUNTER — Encounter (HOSPITAL_COMMUNITY)
Admission: RE | Admit: 2022-10-03 | Discharge: 2022-10-03 | Disposition: A | Discharge status: Home / Self Care | Payer: BC Managed Care – PPO | Source: Ambulatory Visit | Attending: Obstetrics and Gynecology | Admitting: Obstetrics and Gynecology

## 2022-10-03 DIAGNOSIS — R0602 Shortness of breath: Secondary | ICD-10-CM

## 2022-10-03 DIAGNOSIS — U099 Post covid-19 condition, unspecified: Secondary | ICD-10-CM

## 2022-10-03 NOTE — Progress Notes (Addendum)
Home Exercise Prescription I have reviewed a Home Exercise Prescription with Cheryl Wright. Cheryl Wright is currently exercising at home. She is walking outside or on the treadmill 1-2 non-rehab days/wk for 30 min/day. Saavi stated that she has increased her time from 10 min to 30 min. She is trying to progress to 3 non-rehab days of exercise. I encouraged her to increase to 3. She is also trying to progress herself by walking up hills. I am satisfied with Cheryl Wright's current home exercise and her goals to increase her home exercise. Cheryl Wright is very motivated to exercise and improve her functional capacity. The patient stated that their goals were to get back to normal. We reviewed exercise guidelines, target heart rate during exercise, RPE Scale, weather conditions, endpoints for exercise, warmup and cool down. The patient is encouraged to come to me with any questions. I will continue to follow up with the patient to assist them with progression and safety. Spent 15 min with patient discussing home exercise plan and goals.  Joya San, MS, ACSM-CEP 10/03/2022 4:01 PM

## 2022-10-03 NOTE — Progress Notes (Signed)
Daily Session Note  Patient Details  Name: Cheryl Wright MRN: [id number] Date of Birth: 1976/04/03 Referring Provider:    Encounter Date: 10/03/2022  Check In:  Session Check In - 10/03/22 1146       Check-In   Supervising physician immediately available to respond to emergencies [facility] MD immediately available    Physician(s) Jules Schick, NP    Location [facility]    Staff Present Samantha Belarus, RD, Dutch Gray, RN, BSN;Randi Reeve BS, ACSM-CEP, Exercise Physiologist;Kaylee Earlene Plater, MS, ACSM-CEP, Exercise Physiologist;Casey Katrinka Blazing, Pennelope Bracken, RN, BSN    Virtual Visit No    Medication changes reported     No    Fall or balance concerns reported    No    Tobacco Cessation No Change    Warm-up and Cool-down Performed as group-led instruction    Resistance Training Performed Yes    VAD Patient? No    PAD/SET Patient? No      Pain Assessment   Currently in Pain? No/denies    Pain Score 0-No pain    Multiple Pain Sites No             Capillary Blood Glucose: No results found for this or any previous visit (from the past 24 hour(s)).    Social History   Tobacco Use  Smoking Status Former   Years: 2   Types: Cigarettes  Smokeless Tobacco Never    Goals Met:  Independence with exercise equipment Exercise tolerated well Personal goals reviewed Strength training completed today  Goals Unmet:  Not Applicable  Comments: Service time is from 1017 to 1145    Dr. Mechele Collin is Medical Director for Pulmonary Rehab at [facility].

## 2022-10-08 ENCOUNTER — Encounter (HOSPITAL_COMMUNITY)
Admission: RE | Admit: 2022-10-08 | Discharge: 2022-10-08 | Disposition: A | Discharge status: Home / Self Care | Payer: BC Managed Care – PPO | Source: Ambulatory Visit | Attending: Obstetrics and Gynecology | Admitting: Obstetrics and Gynecology

## 2022-10-08 VITALS — Wt 160.7 lb

## 2022-10-08 DIAGNOSIS — U099 Post covid-19 condition, unspecified: Secondary | ICD-10-CM

## 2022-10-08 DIAGNOSIS — R0602 Shortness of breath: Secondary | ICD-10-CM

## 2022-10-08 NOTE — Progress Notes (Signed)
Daily Session Note  Patient Details  Name: Cheryl Wright MRN: [id number] Date of Birth: Jun 09, 1976 Referring Provider:    Encounter Date: 10/08/2022  Check In:  Session Check In - 10/08/22 1521       Check-In   Supervising physician immediately available to respond to emergencies [facility] MD immediately available    Physician(s) Jari Favre, PA    [facility]    Staff Present Essie Hart, RN, BSN;Randi Idelle Crouch BS, ACSM-CEP, Exercise Physiologist;Cane Dubray Earlene Plater, MS, ACSM-CEP, Exercise Physiologist;Casey Katrinka Blazing, RT    Virtual Visit No    Medication changes reported     No    Fall or balance concerns reported    No    Tobacco Cessation No Change    Warm-up and Cool-down Performed as group-led instruction    Resistance Training Performed Yes    VAD Patient? No    PAD/SET Patient? No      Pain Assessment   Currently in Pain? Yes    Pain Score 7     Pain Location Rib cage    Pain Orientation Left    Pain Descriptors / Indicators Burning    Pain Type Chronic pain    Pain Onset More than a month ago    Pain Frequency Several days a week    Multiple Pain Sites No             Capillary Blood Glucose: No results found for this or any previous visit (from the past 24 hour(s)).   Exercise Prescription Changes - 10/08/22 1500       Response to Exercise   Blood Pressure (Admit) 98/60    Blood Pressure (Exercise) 122/64    Blood Pressure (Exit) 92/58    Heart Rate (Admit) 56 bpm    Heart Rate (Exercise) 136 bpm    Heart Rate (Exit) 92 bpm    Oxygen Saturation (Admit) 97 %    Oxygen Saturation (Exercise) 96 %    Oxygen Saturation (Exit) 96 %    Rating of Perceived Exertion (Exercise) 13    Perceived Dyspnea (Exercise) 3    Duration Continue with 30 min of aerobic exercise without signs/symptoms of physical distress.    Intensity THRR unchanged      Progression   Progression Continue to progress workloads to maintain intensity without  signs/symptoms of physical distress.      Resistance Training   Weight blue bands    Reps 10-15    Time 10 Minutes      Interval Training   Interval Training Yes    Equipment Treadmill    Comments 1 min @ 3.36mph and 3.5% and 2 min @ 3.5 mph and 1.0%      Treadmill   MPH 3.5    Grade 3.5    Minutes 15    METs 5.37      Rower   Level 2    Watts 27    Minutes 15             Social History   Tobacco Use  Smoking Status Former   Years: 2   Types: Cigarettes  Smokeless Tobacco Never    Goals Met:  Proper associated with RPD/PD & O2 Sat Independence with exercise equipment Exercise tolerated well No report of concerns or symptoms today Strength training completed today  Goals Unmet:  Not Applicable  Comments: Service time is from 1027 to 1150.    Dr. Mechele Collin is Medical Director for Pulmonary Rehab  at [facility].

## 2022-10-10 ENCOUNTER — Encounter (HOSPITAL_COMMUNITY)
Admission: RE | Admit: 2022-10-10 | Discharge: 2022-10-10 | Disposition: A | Discharge status: Home / Self Care | Payer: BC Managed Care – PPO | Source: Ambulatory Visit | Attending: Obstetrics and Gynecology | Admitting: Obstetrics and Gynecology

## 2022-10-10 DIAGNOSIS — R0602 Shortness of breath: Secondary | ICD-10-CM | POA: Diagnosis not present

## 2022-10-10 DIAGNOSIS — U099 Post covid-19 condition, unspecified: Secondary | ICD-10-CM

## 2022-10-10 NOTE — Progress Notes (Signed)
Daily Session Note  Patient Details  Name: Cheryl Wright MRN: [id number] Date of Birth: [birth year] Referring Provider:    Encounter Date: 10/10/2022  Check In:  Session Check In - 10/10/22 1225       Check-In   Supervising physician immediately available to respond to emergencies [facility] MD immediately available    Physician(s) Eligha Bridegroom NP    [facility]    Staff Present Essie Hart, RN, BSN;Mkenzie Dotts Idelle Crouch BS, ACSM-CEP, Exercise Physiologist;Kaylee Earlene Plater, MS, ACSM-CEP, Exercise Physiologist;Casey Hermine Messick Belarus, RD, LDN    Virtual Visit No    Medication changes reported     No    Fall or balance concerns reported    No    Tobacco Cessation No Change    Warm-up and Cool-down Performed as group-led instruction    Resistance Training Performed Yes    VAD Patient? No    PAD/SET Patient? No             Capillary Blood Glucose: No results found for this or any previous visit (from the past 24 hour(s)).    Social History   Tobacco Use  Smoking Status Former   Years: 2   Types: Cigarettes  Smokeless Tobacco Never    Goals Met:  Independence with exercise equipment Improved SOB with ADL's Exercise tolerated well No report of concerns or symptoms today Strength training completed today  Goals Unmet:  Not Applicable  Comments: Service time is from 1022 to 1155.    Dr. Mechele Collin is Medical Director for Pulmonary Rehab at [facility].

## 2022-10-11 ENCOUNTER — Telehealth (HOSPITAL_COMMUNITY): Payer: Self-pay

## 2022-10-11 NOTE — Telephone Encounter (Signed)
-----   Message from Chi Mechele Collin, MD sent at 10/09/2022  8:19 AM EDT ----- Regarding: RE: THR OK to increase HR to 70-157 ----- Message ----- From: Joya San Sent: 10/08/2022   4:07 PM EDT To: Luciano Cutter, MD Subject: THR                                            Dr. Everardo All,  I am requesting a target heart rate range increase on Cheryl Wright. Current range is 70-139 bpm. New range is 70-157 bpm. Please advise.   Thanks  [employer]

## 2022-10-15 ENCOUNTER — Encounter (HOSPITAL_COMMUNITY)
Admission: RE | Admit: 2022-10-15 | Discharge: 2022-10-15 | Disposition: A | Discharge status: Home / Self Care | Payer: BC Managed Care – PPO | Source: Ambulatory Visit | Attending: Obstetrics and Gynecology | Admitting: Obstetrics and Gynecology

## 2022-10-15 DIAGNOSIS — R0602 Shortness of breath: Secondary | ICD-10-CM | POA: Diagnosis not present

## 2022-10-15 DIAGNOSIS — U099 Post covid-19 condition, unspecified: Secondary | ICD-10-CM

## 2022-10-15 NOTE — Progress Notes (Signed)
Daily Session Note  Patient Details  Name: Cheryl Wright MRN: [id number] Date of Birth: [birth year] Referring Provider:    Encounter Date: 10/15/2022  Check In:  Session Check In - 10/15/22 1219       Check-In   Supervising physician immediately available to respond to emergencies [facility] MD immediately available    Physician(s) Eligha Bridegroom NP    [facility]    Staff Present Essie Hart, RN, BSN;Hilary Milks Idelle Crouch BS, ACSM-CEP, Exercise Physiologist;Kaylee Earlene Plater, MS, ACSM-CEP, Exercise Physiologist;Casey Hermine Messick Belarus, RD, LDN    Virtual Visit No    Medication changes reported     No    Fall or balance concerns reported    No    Tobacco Cessation No Change    Warm-up and Cool-down Performed as group-led instruction    Resistance Training Performed Yes    VAD Patient? No    PAD/SET Patient? No      Pain Assessment   Currently in Pain? No/denies             Capillary Blood Glucose: No results found for this or any previous visit (from the past 24 hour(s)).    Social History   Tobacco Use  Smoking Status Former   Years: 2   Types: Cigarettes  Smokeless Tobacco Never    Goals Met:  Independence with exercise equipment Improved SOB with ADL's Exercise tolerated well No report of concerns or symptoms today Strength training completed today  Goals Unmet:  Not Applicable  Comments: Service time is from 1021 to 1145.    Dr. Mechele Collin is Medical Director for Pulmonary Rehab at [facility].

## 2022-10-17 ENCOUNTER — Encounter (HOSPITAL_COMMUNITY)
Admission: RE | Admit: 2022-10-17 | Discharge: 2022-10-17 | Disposition: A | Discharge status: Home / Self Care | Payer: BC Managed Care – PPO | Source: Ambulatory Visit | Attending: Obstetrics and Gynecology | Admitting: Obstetrics and Gynecology

## 2022-10-17 DIAGNOSIS — U099 Post covid-19 condition, unspecified: Secondary | ICD-10-CM

## 2022-10-17 DIAGNOSIS — R0602 Shortness of breath: Secondary | ICD-10-CM | POA: Diagnosis not present

## 2022-10-17 NOTE — Progress Notes (Signed)
Daily Session Note  Patient Details  Name: Cheryl Wright MRN: [id number] Date of Birth: 07/17/1975 Referring Provider:    Encounter Date: 10/17/2022  Check In:  Session Check In - 10/17/22 1224       Check-In   Supervising physician immediately available to respond to emergencies [facility] MD immediately available    Physician(s) Bernadene Person, NP    Location [facility]    Staff Present Essie Hart, RN, BSN;Randi Idelle Crouch BS, ACSM-CEP, Exercise Physiologist;Kaylee Earlene Plater, MS, ACSM-CEP, Exercise Physiologist;Lamica Mccart Hermine Messick Belarus, RD, LDN    Virtual Visit No    Medication changes reported     No    Fall or balance concerns reported    No    Tobacco Cessation No Change    Warm-up and Cool-down Performed as group-led instruction    Resistance Training Performed Yes    VAD Patient? No    PAD/SET Patient? No      Pain Assessment   Currently in Pain? No/denies    Pain Score 0-No pain    Multiple Pain Sites No             Capillary Blood Glucose: No results found for this or any previous visit (from the past 24 hour(s)).    Social History   Tobacco Use  Smoking Status Former   Years: 2   Types: Cigarettes  Smokeless Tobacco Never    Goals Met:  Proper associated with RPD/PD & O2 Sat Independence with exercise equipment Exercise tolerated well No report of concerns or symptoms today Strength training completed today  Goals Unmet:  Not Applicable  Comments: Service time is from 1022 to 1150.    Dr. Mechele Collin is Medical Director for Pulmonary Rehab at [facility].

## 2022-10-21 ENCOUNTER — Telehealth (HOSPITAL_COMMUNITY): Payer: Self-pay

## 2022-10-21 NOTE — Telephone Encounter (Signed)
-----   Message from Chi Mechele Collin, MD sent at 10/18/2022  4:02 PM EDT ----- Regarding: RE: Jogging Request Approved for jogging. ----- Message ----- From: Joya San Sent: 10/18/2022   3:02 PM EDT To: Luciano Cutter, MD Subject: Jogging Request                                Dr. Everardo All,  I am contacting you to request jogging privileges for Cheryl Wright. She is currently walking on the treadmill at 3.5 mph speed. Julieana is wanting to progress to jogging as she used to run. Her current target heart rate range is 70-157 bpm. She has maintained in this range. Please advise.   Thanks  [employer]

## 2022-10-22 ENCOUNTER — Encounter (HOSPITAL_COMMUNITY)
Admission: RE | Admit: 2022-10-22 | Discharge: 2022-10-22 | Disposition: A | Discharge status: Home / Self Care | Payer: BC Managed Care – PPO | Source: Ambulatory Visit | Attending: Obstetrics and Gynecology | Admitting: Obstetrics and Gynecology

## 2022-10-22 VITALS — Wt 162.0 lb

## 2022-10-22 DIAGNOSIS — R0602 Shortness of breath: Secondary | ICD-10-CM

## 2022-10-22 DIAGNOSIS — U099 Post covid-19 condition, unspecified: Secondary | ICD-10-CM

## 2022-10-22 NOTE — Progress Notes (Signed)
Daily Session Note  Patient Details  Name: Cheryl Wright MRN: [id number] Date of Birth: September 18, 1975 Referring Provider:    Encounter Date: 10/22/2022  Check In:  Session Check In - 10/22/22 1337       Check-In   Supervising physician immediately available to respond to emergencies [facility] MD immediately available    Physician(s) Robin Searing, NP    [facility]    Staff Present Essie Hart, RN, BSN;Randi Idelle Crouch BS, ACSM-CEP, Exercise Physiologist;Kaylee Earlene Plater, MS, ACSM-CEP, Exercise Physiologist;Luken Shadowens Hermine Messick Belarus, RD, LDN;Olinty Peggye Pitt, MS, ACSM-CEP, Exercise Physiologist    Virtual Visit No    Medication changes reported     No    Fall or balance concerns reported    No    Tobacco Cessation No Change    Warm-up and Cool-down Performed as group-led instruction    Resistance Training Performed Yes    VAD Patient? No    PAD/SET Patient? No      Pain Assessment   Currently in Pain? No/denies    Pain Score 0-No pain    Multiple Pain Sites No             Capillary Blood Glucose: No results found for this or any previous visit (from the past 24 hour(s)).   Exercise Prescription Changes - 10/22/22 1500       Response to Exercise   Blood Pressure (Admit) 90/60    Blood Pressure (Exercise) 124/66    Blood Pressure (Exit) 100/64    Heart Rate (Admit) 78 bpm    Heart Rate (Exercise) 132 bpm    Heart Rate (Exit) 95 bpm    Oxygen Saturation (Admit) 95 %    Oxygen Saturation (Exercise) 96 %    Oxygen Saturation (Exit) 96 %    Rating of Perceived Exertion (Exercise) 11    Perceived Dyspnea (Exercise) 2.5    Duration Continue with 30 min of aerobic exercise without signs/symptoms of physical distress.    Intensity THRR unchanged      Resistance Training   Weight blue bands    Reps 10-15    Time 10 Minutes      Interval Training   Interval Training Yes    Equipment Treadmill    Comments 1 min @ 3.5mph and 3.5% and 2 min @ 3.5 mph  and 1.0%      Treadmill   MPH 3.5    Grade 3.5    Minutes 15    METs 5.37      Rower   Level 3    Watts 34    Minutes 15             Social History   Tobacco Use  Smoking Status Former   Years: 2   Types: Cigarettes  Smokeless Tobacco Never    Goals Met:  Proper associated with RPD/PD & O2 Sat Independence with exercise equipment Exercise tolerated well No report of concerns or symptoms today Strength training completed today  Goals Unmet:  Not Applicable  Comments: Service time is from 1315 to 1446.    Dr. Mechele Collin is Medical Director for Pulmonary Rehab at [facility].

## 2022-10-24 ENCOUNTER — Encounter (HOSPITAL_COMMUNITY)
Admission: RE | Admit: 2022-10-24 | Discharge: 2022-10-24 | Disposition: A | Discharge status: Home / Self Care | Payer: BC Managed Care – PPO | Source: Ambulatory Visit | Attending: Obstetrics and Gynecology | Admitting: Obstetrics and Gynecology

## 2022-10-24 DIAGNOSIS — R0602 Shortness of breath: Secondary | ICD-10-CM | POA: Diagnosis present

## 2022-10-24 DIAGNOSIS — U099 Post covid-19 condition, unspecified: Secondary | ICD-10-CM

## 2022-10-24 NOTE — Progress Notes (Signed)
Daily Session Note  Patient Details  Name: Cheryl Wright MRN: [id number] Date of Birth: Jan 31, 1976 Referring Provider:    Encounter Date: 10/24/2022  Check In:  Session Check In - 10/24/22 1138       Check-In   Supervising physician immediately available to respond to emergencies [facility] immediately available    Physician(s) Edd Fabian, NP    [facility]    Staff Present Essie Hart, RN, BSN;Randi Idelle Crouch BS, ACSM-CEP, Exercise Physiologist;Kaylee Earlene Plater, MS, ACSM-CEP, Exercise Physiologist;Kobyn Kray Katrinka Blazing, RT    Virtual Visit No    Medication changes reported     No    Fall or balance concerns reported    No    Tobacco Cessation No Change    Warm-up and Cool-down Performed as group-led instruction    Resistance Training Performed Yes    VAD Patient? No    PAD/SET Patient? No      Pain Assessment   Currently in Pain? No/denies    Pain Score 0-No pain    Multiple Pain Sites No             Capillary Blood Glucose: No results found for this or any previous visit (from the past 24 hour(s)).    Social History   Tobacco Use  Smoking Status Former   Years: 2   Types: Cigarettes  Smokeless Tobacco Never    Goals Met:  Proper associated with RPD/PD & O2 Sat Independence with exercise equipment Exercise tolerated well No report of concerns or symptoms today Strength training completed today  Goals Unmet:  Not Applicable  Comments: Service time is from 1018 to 1150.    Dr. Mechele Collin is Medical Director for Pulmonary Rehab at [facility].

## 2022-10-29 ENCOUNTER — Encounter (HOSPITAL_COMMUNITY)
Admission: RE | Admit: 2022-10-29 | Discharge: 2022-10-29 | Disposition: A | Discharge status: Home / Self Care | Payer: BC Managed Care – PPO | Source: Ambulatory Visit | Attending: Obstetrics and Gynecology | Admitting: Obstetrics and Gynecology

## 2022-10-29 DIAGNOSIS — U099 Post covid-19 condition, unspecified: Secondary | ICD-10-CM

## 2022-10-29 DIAGNOSIS — R0602 Shortness of breath: Secondary | ICD-10-CM

## 2022-10-29 NOTE — Progress Notes (Signed)
Daily Session Note  Patient Details  Name: Cheryl Wright MRN: [id number] Date of Birth: 12/30/1975 Referring Provider:    Encounter Date: 10/29/2022  Check In:  Session Check In - 10/29/22 1222       Check-In   Supervising physician immediately available to respond to emergencies [facility] MD immediately available    Physician(s) Carlos Levering, NP    [facility]    Staff Present Essie Hart, RN, BSN;Randi Idelle Crouch BS, ACSM-CEP, Exercise Physiologist;Kaylee Earlene Plater, MS, ACSM-CEP, Exercise Physiologist;Casey Marcille Buffy, RN, BSN    Virtual Visit No    Medication changes reported     No    Fall or balance concerns reported    No    Tobacco Cessation No Change    Warm-up and Cool-down Performed as group-led instruction    Resistance Training Performed Yes    VAD Patient? No    PAD/SET Patient? No      Pain Assessment   Currently in Pain? No/denies    Pain Score 0-No pain    Multiple Pain Sites No             Capillary Blood Glucose: No results found for this or any previous visit (from the past 24 hour(s)).    Social History   Tobacco Use  Smoking Status Former   Years: 2   Types: Cigarettes  Smokeless Tobacco Never    Goals Met:  Independence with exercise equipment Improved SOB with ADL's Exercise tolerated well No report of concerns or symptoms today Strength training completed today  Goals Unmet:  Not Applicable  Comments: Service time is from 1024 to 1149    Dr. Mechele Collin is Medical Director for Pulmonary Rehab at [facility].

## 2022-10-30 ENCOUNTER — Ambulatory Visit (HOSPITAL_BASED_OUTPATIENT_CLINIC_OR_DEPARTMENT_OTHER): Payer: BC Managed Care – PPO | Admitting: Cardiology

## 2022-10-30 ENCOUNTER — Encounter (HOSPITAL_BASED_OUTPATIENT_CLINIC_OR_DEPARTMENT_OTHER): Payer: Self-pay | Admitting: Cardiology

## 2022-10-30 VITALS — BP 118/76 | HR 75 | Ht 69.0 in | Wt 161.6 lb

## 2022-10-30 DIAGNOSIS — U099 Post covid-19 condition, unspecified: Secondary | ICD-10-CM

## 2022-10-30 DIAGNOSIS — I959 Hypotension, unspecified: Secondary | ICD-10-CM

## 2022-10-30 NOTE — Patient Instructions (Signed)
Medication Instructions:  Your physician recommends that you continue on your current medications as directed. Please refer to the Current Medication list given to you today.  Follow-Up: At [facility], you and your health needs are our priority.  As part of our continuing mission to provide you with exceptional heart care, we have created designated Provider Care Teams.  These Care Teams include your primary Cardiologist (physician) and Advanced Practice Providers (APPs -  Physician Assistants and Nurse Practitioners) who all work together to provide you with the care you need, when you need it.  We recommend signing up for the patient portal called "MyChart".  Sign up information is provided on this After Visit Summary.  MyChart is used to connect with patients for Virtual Visits (Telemedicine).  Patients are able to view lab/test results, encounter notes, upcoming appointments, etc.  Non-urgent messages can be sent to your provider as well.   To learn more about what you can do with MyChart, go to [email or web address].    Your next appointment:   Call us if you need [place]!

## 2022-10-30 NOTE — Progress Notes (Signed)
Cardiology Office Note:    Date:  10/30/2022   ID:  Cheryl Wright, DOB 03-12-1976, MRN [id number]  PCP:  Sherian Rein, MD   [facility] Providers Cardiologist:  None     Referring MD: Luciano Cutter, MD    History of Present Illness:    Cheryl Wright is a 47 y.o. female here for the evaluation of hypotension at the request of Dr. Everardo All. In pulm rehab 2 times a week. BP is low there. Feels HA, FAINT, DIZZY. Even when lying in bed. Long haul COVID. Tried hydrating more.   98/50, 96/50's. Always been low. But preemclampsia with twins.  Sometimes will feel some dizziness with standing up too quickly.  ECHO 09/18/22:   1. Left ventricular ejection fraction, by estimation, is 60 to 65%. Left  ventricular ejection fraction by 3D volume is 63 %. The left ventricle has  normal function. The left ventricle has no regional wall motion  abnormalities. Left ventricular diastolic   parameters were normal.   2. Right ventricular systolic function is normal. The right ventricular  size is normal. There is normal pulmonary artery systolic pressure. The  estimated right ventricular systolic pressure is 18.4 mmHg.   3. The mitral valve is normal in structure. Trivial mitral valve  regurgitation.   4. The aortic valve is tricuspid. Aortic valve regurgitation is not  visualized. No aortic stenosis is present.   5. The inferior vena cava is normal in size with greater than 50%  respiratory variability, suggesting right atrial pressure of 3 mmHg.   Past Medical History:  Diagnosis Date   Blood transfusion without reported diagnosis    pp hemorrhage 2011 twin delivery   Endometriosis    Frequent UTI    Headache(784.0)    migraine   Hx of preeclampsia, prior pregnancy, currently pregnant    Normal pregnancy 06/01/2012   SVD (spontaneous vaginal delivery) 06/02/2012    Past Surgical History:  Procedure Laterality Date   ANKLE SURGERY     BLADDER SURGERY      dilation   LAPAROSCOPY      Current Medications: Current Meds  Medication Sig   albuterol (PROVENTIL) (2.5 MG/3ML) 0.083% nebulizer solution Take 3 mLs (2.5 mg total) by nebulization every 6 (six) hours as needed for wheezing or shortness of breath.   Albuterol-Budesonide (AIRSUPRA) 90-80 MCG/ACT AERO Inhale 2 puffs into the lungs every 4 (four) hours as needed.   budesonide-formoterol (SYMBICORT) 160-4.5 MCG/ACT inhaler Inhale 2 puffs into the lungs in the morning and at bedtime.   fluticasone (FLONASE) 50 MCG/ACT nasal spray Place 1 spray into both nostrils daily.   ibuprofen (ADVIL,MOTRIN) 800 MG tablet Take 1 tablet (800 mg total) by mouth every 8 (eight) hours as needed for pain.   loratadine (CLARITIN) 10 MG tablet Take 10 mg by mouth daily.   norgestimate-ethinyl estradiol (ORTHO-CYCLEN) 0.25-35 MG-MCG tablet Take 1 tablet by mouth daily.   sertraline (ZOLOFT) 50 MG tablet Take 50 mg by mouth daily.     Allergies:   Penicillins   Social History   Socioeconomic History   Marital status: Married    Spouse name: Not on file   Number of children: 3   Years of education: Not on file   Highest education level: Master's degree (e.g., MA, MS, MEng, MEd, MSW, MBA)  Occupational History   Not on file  Tobacco Use   Smoking status: Former    Years: 2    Types: Cigarettes  Smokeless tobacco: Never  Substance and Sexual Activity   Alcohol use: Yes    Comment: 3-4/week   Drug use: No   Sexual activity: Yes    Comment: preg  Other Topics Concern   Not on file  Social History Narrative   Lives with husband and 3 kids.    Social Determinants of Health   Financial Resource Strain: Not on file  Food Insecurity: Not on file  Transportation Needs: Not on file  Physical Activity: Not on file  Stress: Not on file  Social Connections: Not on file     Family History: The patient's family history includes Breast cancer in some other family members; Breast cancer (age of onset:  40) in her paternal grandmother; Colon cancer (age of onset: 7) in her paternal grandmother; Hyperlipidemia in her mother; Hypertension in her mother; Lung cancer in her father and paternal grandfather; Ovarian cancer in an other family member; Rheum arthritis in her father; Stroke in her maternal grandmother.  ROS:   Please see the history of present illness.    No syncope bleeding.  Sometimes will feel all other systems reviewed and are negative.  EKGs/Labs/Other Studies Reviewed:    The following studies were reviewed today: Cardiac Studies & Procedures       ECHOCARDIOGRAM  ECHOCARDIOGRAM COMPLETE 09/18/2022  Narrative ECHOCARDIOGRAM REPORT    Patient Name:   Cheryl Wright Date of Exam: 09/18/2022 Medical Rec #:  [id number]          Height:       68.0 in Accession #:    [id number]         Weight:       162.8 lb Date of Birth:  [birth year]         BSA:          1.873 m Patient Age:    46 years           BP:           105/68 mmHg Patient Gender: F                  HR:           66 bpm. Exam Location:  Outpatient  Procedure: 2D Echo, 3D Echo, Color Doppler, Cardiac Doppler and Strain Analysis  Indications:    Hypotension  History:        Patient has no prior history of Echocardiogram examinations. Risk Factors:Former Smoker. History of Covid.  Sonographer:    Jeryl Columbia RDCS Referring Phys: [id number] CHI JANE ELLISON  IMPRESSIONS   1. Left ventricular ejection fraction, by estimation, is 60 to 65%. Left ventricular ejection fraction by 3D volume is 63 %. The left ventricle has normal function. The left ventricle has no regional wall motion abnormalities. Left ventricular diastolic parameters were normal. 2. Right ventricular systolic function is normal. The right ventricular size is normal. There is normal pulmonary artery systolic pressure. The estimated right ventricular systolic pressure is 18.4 mmHg. 3. The mitral valve is normal in structure. Trivial mitral  valve regurgitation. 4. The aortic valve is tricuspid. Aortic valve regurgitation is not visualized. No aortic stenosis is present. 5. The inferior vena cava is normal in size with greater than 50% respiratory variability, suggesting right atrial pressure of 3 mmHg.  Comparison(s): No prior Echocardiogram.  FINDINGS Left Ventricle: Left ventricular ejection fraction, by estimation, is 60 to 65%. Left ventricular ejection fraction by 3D volume is 63 %. The left ventricle has normal function.  The left ventricle has no regional wall motion abnormalities. Global longitudinal strain performed but not reported based on interpreter judgement due to suboptimal tracking. The left ventricular internal cavity size was normal in size. There is no left ventricular hypertrophy. Left ventricular diastolic parameters were normal.  Right Ventricle: The right ventricular size is normal. No increase in right ventricular wall thickness. Right ventricular systolic function is normal. There is normal pulmonary artery systolic pressure. The tricuspid regurgitant velocity is 1.96 m/s, and with an assumed right atrial pressure of 3 mmHg, the estimated right ventricular systolic pressure is 18.4 mmHg.  Left Atrium: Left atrial size was normal in size.  Right Atrium: Right atrial size was normal in size.  Pericardium: There is no evidence of pericardial effusion.  Mitral Valve: The mitral valve is normal in structure. Trivial mitral valve regurgitation.  Tricuspid Valve: The tricuspid valve is normal in structure. Tricuspid valve regurgitation is trivial.  Aortic Valve: The aortic valve is tricuspid. Aortic valve regurgitation is not visualized. No aortic stenosis is present.  Pulmonic Valve: The pulmonic valve was normal in structure. Pulmonic valve regurgitation is trivial.  Aorta: The aortic root is normal in size and structure.  Venous: The inferior vena cava is normal in size with greater than 50%  respiratory variability, suggesting right atrial pressure of 3 mmHg.  IAS/Shunts: The atrial septum is grossly normal.   LEFT VENTRICLE PLAX 2D LVIDd:         4.51 cm         Diastology LVIDs:         3.03 cm         LV e' medial:    10.30 cm/s LV PW:         1.17 cm         LV E/e' medial:  4.2 LV IVS:        0.89 cm         LV e' lateral:   12.50 cm/s LVOT diam:     1.90 cm         LV E/e' lateral: 3.5 LV SV:         39 LV SV Index:   21 LVOT Area:     2.84 cm        3D Volume EF LV 3D EF:    Left ventricul ar ejection fraction by 3D volume is 63 %.  3D Volume EF: 3D EF:        63 % LV EDV:       144 ml LV ESV:       54 ml LV SV:        90 ml  RIGHT VENTRICLE RV Basal diam:  4.50 cm RV Mid diam:    3.13 cm RV S prime:     11.60 cm/s TAPSE (M-mode): 2.6 cm  LEFT ATRIUM             Index        RIGHT ATRIUM           Index LA diam:        3.70 cm 1.98 cm/m   RA Area:     13.10 cm LA Vol (A2C):   54.3 ml 29.00 ml/m  RA Volume:   32.40 ml  17.30 ml/m LA Vol (A4C):   27.3 ml 14.58 ml/m LA Biplane Vol: 40.2 ml 21.47 ml/m AORTIC VALVE LVOT Vmax:   70.20 cm/s LVOT Vmean:  46.100 cm/s LVOT VTI:  0.136 m  AORTA Ao Root diam: 3.40 cm  MITRAL VALVE               TRICUSPID VALVE MV Area (PHT): 3.77 cm    TR Peak grad:   15.4 mmHg MV Decel Time: 201 msec    TR Vmax:        196.00 cm/s MV E velocity: 43.70 cm/s MV A velocity: 41.10 cm/s  SHUNTS MV E/A ratio:  1.06        Systemic VTI:  0.14 m Systemic Diam: 1.90 cm  Laurance Flatten MD Electronically signed by Laurance Flatten MD Signature Date/Time: 09/18/2022/3:58:30 PM    Final              EKG: Prior EKG shows sinus rhythm normal intervals  Recent Labs: 07/29/2022: BUN 15; Creatinine, Ser 0.73; Hemoglobin 14.3; Magnesium 2.1; Platelets 289; Potassium 3.9; Sodium 137; TSH 2.378  Recent Lipid Panel No results found for: "CHOL", "TRIG", "HDL", "CHOLHDL", "VLDL", "LDLCALC", "LDLDIRECT"   Risk  Assessment/Calculations:               Physical Exam:    VS:  BP 118/76   Pulse 75   Ht 5\' 9"  (1.753 m)   Wt 161 lb 9.6 oz (73.3 kg)   LMP 10/27/2022   SpO2 95%   BMI 23.86 kg/m     Wt Readings from Last 3 Encounters:  10/30/22 161 lb 9.6 oz (73.3 kg)  10/22/22 162 lb 0.6 oz (73.5 kg)  10/08/22 160 lb 11.5 oz (72.9 kg)     GEN:  Well nourished, well developed in no acute distress HEENT: Normal NECK: No JVD; No carotid bruits LYMPHATICS: No lymphadenopathy CARDIAC: RRR, no murmurs, rubs, gallops RESPIRATORY:  Clear to auscultation without rales, wheezing or rhonchi  ABDOMEN: Soft, non-tender, non-distended MUSCULOSKELETAL:  No edema; No deformity  SKIN: Warm and dry NEUROLOGIC:  Alert and oriented x 3 PSYCHIATRIC:  Normal affect   ASSESSMENT:    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 Hypotension, unspecified hypotension type    PLAN:    In order of problems listed above:  Relative hypotension - Thankfully, echocardiogram shows normal pump and valvular function.  Her electrolytes are normal, thyroid/TSH is normal.  There is no indication of Addison's.  She has had a longstanding history of relatively low blood pressure all of her life.  I think that we are in a good position and continue with conservative management with her, fluid hydration and salt liberalization.  She does crave savory foods she states.  We did mention to her midodrine, Florinef however I would try to avoid these medications if possible.  Compression hose can be helpful as well  Continue with exercise.  She seems to be doing very well with pulmonary rehab.  She may increase her heart rate to upper 150s, 160  She will reach out to [place] if needed.           Medication Adjustments/Labs and Tests Ordered: Current medicines are reviewed at length with the patient today.  Concerns regarding medicines are outlined above.  No orders of the defined types were placed in this encounter.  No orders of the  defined types were placed in this encounter.   Patient Instructions  Medication Instructions:  Your physician recommends that you continue on your current medications as directed. Please refer to the Current Medication list given to you today.  Follow-Up: At [facility], you and your health needs are our priority.  As part of our continuing mission to provide you with exceptional heart care, we have created designated Provider Care Teams.  These Care Teams include your primary Cardiologist (physician) and Advanced Practice Providers (APPs -  Physician Assistants and Nurse Practitioners) who all work together to provide you with the care you need, when you need it.  We recommend signing up for the patient portal called "MyChart".  Sign up information is provided on this After Visit Summary.  MyChart is used to connect with patients for Virtual Visits (Telemedicine).  Patients are able to view lab/test results, encounter notes, upcoming appointments, etc.  Non-urgent messages can be sent to your provider as well.   To learn more about what you can do with MyChart, go to [email or web address].    Your next appointment:   Call us if you need [place]!     Signed, Donato Schultz, MD  10/30/2022 2:29 PM    [facility]

## 2022-10-31 ENCOUNTER — Encounter (HOSPITAL_COMMUNITY)
Admission: RE | Admit: 2022-10-31 | Discharge: 2022-10-31 | Disposition: A | Discharge status: Home / Self Care | Payer: BC Managed Care – PPO | Source: Ambulatory Visit | Attending: Obstetrics and Gynecology | Admitting: Obstetrics and Gynecology

## 2022-10-31 DIAGNOSIS — R0602 Shortness of breath: Secondary | ICD-10-CM | POA: Diagnosis not present

## 2022-10-31 DIAGNOSIS — U099 Post covid-19 condition, unspecified: Secondary | ICD-10-CM

## 2022-10-31 NOTE — Progress Notes (Signed)
Daily Session Note  Patient Details  Name: Royanne Shontell Fonda MRN: [id number] Date of Birth: [birth year] Referring Provider:    Encounter Date: 10/31/2022  Check In:  Session Check In - 10/31/22 1219       Check-In   Supervising physician immediately available to respond to emergencies [facility] MD immediately available    Physician(s) Jari Favre, PA    [facility]    Staff Present Samantha Belarus, RD, Dutch Gray, RN, BSN;Randi Reeve BS, ACSM-CEP, Exercise Physiologist;Kaylee Earlene Plater, MS, ACSM-CEP, Exercise Physiologist;David Manus Gunning, MS, ACSM-CEP, CCRP, Exercise Physiologist;Lanny Lipkin Katrinka Blazing, RT    Virtual Visit No    Medication changes reported     No    Fall or balance concerns reported    No    Tobacco Cessation No Change    Warm-up and Cool-down Performed as group-led instruction    Resistance Training Performed Yes    VAD Patient? No    PAD/SET Patient? No      Pain Assessment   Currently in Pain? No/denies    Multiple Pain Sites No             Capillary Blood Glucose: No results found for this or any previous visit (from the past 24 hour(s)).    Social History   Tobacco Use  Smoking Status Former   Years: 2   Types: Cigarettes  Smokeless Tobacco Never    Goals Met:  Proper associated with RPD/PD & O2 Sat Independence with exercise equipment Exercise tolerated well No report of concerns or symptoms today Strength training completed today  Goals Unmet:  Not Applicable  Comments: Service time is from 1014 to 1145.    Dr. Mechele Collin is Medical Director for Pulmonary Rehab at [facility].

## 2022-11-05 ENCOUNTER — Encounter (HOSPITAL_COMMUNITY)
Admission: RE | Admit: 2022-11-05 | Discharge: 2022-11-05 | Disposition: A | Discharge status: Home / Self Care | Payer: BC Managed Care – PPO | Source: Ambulatory Visit | Attending: Obstetrics and Gynecology | Admitting: Obstetrics and Gynecology

## 2022-11-05 VITALS — Wt 159.0 lb

## 2022-11-05 DIAGNOSIS — R0602 Shortness of breath: Secondary | ICD-10-CM | POA: Diagnosis not present

## 2022-11-05 DIAGNOSIS — U099 Post covid-19 condition, unspecified: Secondary | ICD-10-CM

## 2022-11-05 NOTE — Progress Notes (Signed)
Daily Session Note  Patient Details  Name: Cheryl Wright MRN: [id number] Date of Birth: 04-03-1976 Referring Provider:    Encounter Date: 11/05/2022  Check In:  Session Check In - 11/05/22 1158       Check-In   Supervising physician immediately available to respond to emergencies [facility] MD immediately available    Physician(s) Jari Favre, PA    Location [facility]    Staff Present Samantha Belarus, RD, Dutch Gray, RN, BSN;Randi Reeve BS, ACSM-CEP, Exercise Physiologist;Olinty Peggye Pitt, MS, ACSM-CEP, Exercise Physiologist;Kaylee Earlene Plater, MS, ACSM-CEP, Exercise Physiologist;Anacleto Batterman Katrinka Blazing, RT    Virtual Visit No    Medication changes reported     No    Fall or balance concerns reported    No    Tobacco Cessation No Change    Warm-up and Cool-down Performed as group-led instruction    Resistance Training Performed Yes    VAD Patient? No    PAD/SET Patient? No      Pain Assessment   Currently in Pain? No/denies    Multiple Pain Sites No             Capillary Blood Glucose: No results found for this or any previous visit (from the past 24 hour(s)).   Exercise Prescription Changes - 11/05/22 1200       Response to Exercise   Blood Pressure (Admit) 98/54    Blood Pressure (Exercise) 100/62    Blood Pressure (Exit) 90/52    Heart Rate (Admit) 81 bpm    Heart Rate (Exercise) 137 bpm    Heart Rate (Exit) 96 bpm    Oxygen Saturation (Admit) 97 %    Oxygen Saturation (Exercise) 96 %    Oxygen Saturation (Exit) 96 %    Rating of Perceived Exertion (Exercise) 15    Perceived Dyspnea (Exercise) 3    Duration Continue with 30 min of aerobic exercise without signs/symptoms of physical distress.    Intensity THRR unchanged      Progression   Progression Continue to progress workloads to maintain intensity without signs/symptoms of physical distress.      Resistance Training   Weight black bands    Reps 10-15    Time 10 Minutes      Interval Training    Interval Training Yes    Equipment Treadmill    Comments 45 sec@5 .0 38min@ 3.0      Treadmill   MPH 5    Grade 0    Minutes 15    METs 8.66      Rower   Level 3    Watts 50    Minutes 15             Social History   Tobacco Use  Smoking Status Former   Years: 2   Types: Cigarettes  Smokeless Tobacco Never    Goals Met:  Proper associated with RPD/PD & O2 Sat Independence with exercise equipment Exercise tolerated well No report of concerns or symptoms today Strength training completed today  Goals Unmet:  Not Applicable  Comments: Service time is from 1013 to 1134.    Dr. Mechele Collin is Medical Director for Pulmonary Rehab at [facility].

## 2022-11-06 NOTE — Progress Notes (Signed)
Pulmonary Individual Treatment Plan  Patient Details  Name: Lelia Tahera Prendes MRN: [id number] Date of Birth: 1976-04-22 Referring Provider:    Initial Encounter Date:   Visit Diagnosis: Shortness of breath  COVID-19 long hauler  Patient's Home Medications on Admission:   Current Outpatient Medications:    albuterol (PROVENTIL) (2.5 MG/3ML) 0.083% nebulizer solution, Take 3 mLs (2.5 mg total) by nebulization every 6 (six) hours as needed for wheezing or shortness of breath., Disp: 75 mL, Rfl: 2   albuterol (VENTOLIN HFA) 108 (90 Base) MCG/ACT inhaler, SMARTSIG:1 Puff(s) Via Inhaler Every 4 Hours PRN (Patient not taking: Reported on 10/30/2022), Disp: , Rfl:    Albuterol-Budesonide (AIRSUPRA) 90-80 MCG/ACT AERO, Inhale 2 puffs into the lungs every 4 (four) hours as needed., Disp: 10.7 g, Rfl: 5   budesonide-formoterol (SYMBICORT) 160-4.5 MCG/ACT inhaler, Inhale 2 puffs into the lungs in the morning and at bedtime., Disp: 1 each, Rfl: 3   fluticasone (FLONASE) 50 MCG/ACT nasal spray, Place 1 spray into both nostrils daily., Disp: , Rfl:    ibuprofen (ADVIL,MOTRIN) 800 MG tablet, Take 1 tablet (800 mg total) by mouth every 8 (eight) hours as needed for pain., Disp: 40 tablet, Rfl: 1   loratadine (CLARITIN) 10 MG tablet, Take 10 mg by mouth daily., Disp: , Rfl:    norgestimate-ethinyl estradiol (ORTHO-CYCLEN) 0.25-35 MG-MCG tablet, Take 1 tablet by mouth daily., Disp: , Rfl:    sertraline (ZOLOFT) 50 MG tablet, Take 50 mg by mouth daily., Disp: , Rfl:   Past Medical History: Past Medical History:  Diagnosis Date   Blood transfusion without reported diagnosis    pp hemorrhage 2011 twin delivery   Endometriosis    Frequent UTI    Headache(784.0)    migraine   Hx of preeclampsia, prior pregnancy, currently pregnant    Normal pregnancy 06/01/2012   SVD (spontaneous vaginal delivery) 06/02/2012    Tobacco Use: Social History   Tobacco Use  Smoking Status Former   Years: 2   Types:  Cigarettes  Smokeless Tobacco Never    Labs: Review Flowsheet        No data to display          Capillary Blood Glucose: No results found for: "GLUCAP"   Pulmonary Assessment Scores:  Pulmonary Assessment Scores     Row Name 08/30/22 1109         ADL UCSD   ADL Phase Entry     SOB Score total 86       CAT Score   CAT Score 31             UCSD: Self-administered rating of dyspnea associated with activities of daily living (ADLs) 6-point scale (0 = "not at all" to 5 = "maximal or unable to do because of breathlessness")  Scoring Scores range from 0 to 120.  Minimally important difference is 5 units  CAT: CAT can identify the health impairment of COPD patients and is better correlated with disease progression.  CAT has a scoring range of zero to 40. The CAT score is classified into four groups of low (less than 10), medium (10 - 20), high (21-30) and very high (31-40) based on the impact level of disease on health status. A CAT score over 10 suggests significant symptoms.  A worsening CAT score could be explained by an exacerbation, poor medication adherence, poor inhaler technique, or progression of COPD or comorbid conditions.  CAT MCID is 2 points  mMRC: mMRC (Modified Medical Research Council) Dyspnea  Scale is used to assess the degree of baseline functional disability in patients of respiratory disease due to dyspnea. No minimal important difference is established. A decrease in score of 1 point or greater is considered a positive change.   Pulmonary Function Assessment:   Exercise Target Goals: Exercise Program Goal: Individual exercise prescription set using results from initial 6 min walk test and THRR while considering  patient's activity barriers and safety.   Exercise Prescription Goal: Initial exercise prescription builds to 30-45 minutes a day of aerobic activity, 2-3 days per week.  Home exercise guidelines will be given to patient during program  as part of exercise prescription that the participant will acknowledge.  Activity Barriers & Risk Stratification:  Activity Barriers & Cardiac Risk Stratification - 08/30/22 1050       Activity Barriers & Cardiac Risk Stratification   Activity Barriers Shortness of Breath;Deconditioning;Muscular Weakness;Joint Problems             6 Minute Walk:  6 Minute Walk     Row Name 08/30/22 1138         6 Minute Walk   Phase Initial     Distance 1080 feet     Walk Time 6 minutes     # of Rest Breaks 0     MPH 2.05     METS 3.85     RPE 12     Perceived Dyspnea  1.5     VO2 Peak 13.48     Symptoms No     Resting HR 102 bpm     Resting BP 98/72     Resting Oxygen Saturation  97 %     Exercise Oxygen Saturation  during 6 min walk 95 %     Max Ex. HR 105 bpm     Max Ex. BP 102/66     2 Minute Post BP 100/62       Interval HR   1 Minute HR 102     2 Minute HR 101     3 Minute HR 100     4 Minute HR 102     5 Minute HR 102     6 Minute HR 105     2 Minute Post HR 88     Interval Heart Rate? Yes       Interval Oxygen   Interval Oxygen? Yes     Baseline Oxygen Saturation % 97 %     1 Minute Oxygen Saturation % 96 %     1 Minute Liters of Oxygen 0 L     2 Minute Oxygen Saturation % 95 %     2 Minute Liters of Oxygen 0 L     3 Minute Oxygen Saturation % 95 %     3 Minute Liters of Oxygen 0 L     4 Minute Oxygen Saturation % 95 %     4 Minute Liters of Oxygen 0 L     5 Minute Oxygen Saturation % 96 %     5 Minute Liters of Oxygen 0 L     6 Minute Oxygen Saturation % 95 %     6 Minute Liters of Oxygen 0 L     2 Minute Post Oxygen Saturation % 96 %     2 Minute Post Liters of Oxygen 0 L              Oxygen Initial Assessment:  Oxygen Initial Assessment - 08/30/22 1053  Home Oxygen   Home Oxygen Device None    Sleep Oxygen Prescription None    Home Exercise Oxygen Prescription None    Home Resting Oxygen Prescription None      Initial 6 min Walk    Oxygen Used None      Program Oxygen Prescription   Program Oxygen Prescription None      Intervention   Short Term Goals To learn and understand importance of maintaining oxygen saturations>88%;To learn and demonstrate proper use of respiratory medications;To learn and understand importance of monitoring SPO2 with pulse oximeter and demonstrate accurate use of the pulse oximeter.;To learn and demonstrate proper pursed lip breathing techniques or other breathing techniques.     Long  Term Goals Maintenance of O2 saturations>88%;Compliance with respiratory medication;Verbalizes importance of monitoring SPO2 with pulse oximeter and return demonstration;Exhibits proper breathing techniques, such as pursed lip breathing or other method taught during program session;Demonstrates proper use of MDI's             Oxygen Re-Evaluation:  Oxygen Re-Evaluation     Row Name 08/30/22 1353 09/24/22 0906 10/31/22 0911         Program Oxygen Prescription   Program Oxygen Prescription None None None       Home Oxygen   Home Oxygen Device None None None     Sleep Oxygen Prescription None None None     Home Exercise Oxygen Prescription None None None     Home Resting Oxygen Prescription None None None       Goals/Expected Outcomes   Short Term Goals To learn and understand importance of maintaining oxygen saturations>88%;To learn and demonstrate proper use of respiratory medications;To learn and understand importance of monitoring SPO2 with pulse oximeter and demonstrate accurate use of the pulse oximeter.;To learn and demonstrate proper pursed lip breathing techniques or other breathing techniques.  To learn and understand importance of maintaining oxygen saturations>88%;To learn and demonstrate proper use of respiratory medications;To learn and understand importance of monitoring SPO2 with pulse oximeter and demonstrate accurate use of the pulse oximeter.;To learn and demonstrate proper pursed lip  breathing techniques or other breathing techniques.  To learn and understand importance of maintaining oxygen saturations>88%;To learn and demonstrate proper use of respiratory medications;To learn and understand importance of monitoring SPO2 with pulse oximeter and demonstrate accurate use of the pulse oximeter.;To learn and demonstrate proper pursed lip breathing techniques or other breathing techniques.      Long  Term Goals Maintenance of O2 saturations>88%;Compliance with respiratory medication;Verbalizes importance of monitoring SPO2 with pulse oximeter and return demonstration;Exhibits proper breathing techniques, such as pursed lip breathing or other method taught during program session;Demonstrates proper use of MDI's Maintenance of O2 saturations>88%;Compliance with respiratory medication;Verbalizes importance of monitoring SPO2 with pulse oximeter and return demonstration;Exhibits proper breathing techniques, such as pursed lip breathing or other method taught during program session;Demonstrates proper use of MDI's Maintenance of O2 saturations>88%;Compliance with respiratory medication;Verbalizes importance of monitoring SPO2 with pulse oximeter and return demonstration;Exhibits proper breathing techniques, such as pursed lip breathing or other method taught during program session;Demonstrates proper use of MDI's     Goals/Expected Outcomes Compliance and understanding of oxygen saturation monitoring and breathing techniques to decrease shortness of breath. Compliance and understanding of oxygen saturation monitoring and breathing techniques to decrease shortness of breath. Compliance and understanding of oxygen saturation monitoring and breathing techniques to decrease shortness of breath.              Oxygen Discharge (Final Oxygen  Re-Evaluation):  Oxygen Re-Evaluation - 10/31/22 0911       Program Oxygen Prescription   Program Oxygen Prescription None      Home Oxygen   Home Oxygen  Device None    Sleep Oxygen Prescription None    Home Exercise Oxygen Prescription None    Home Resting Oxygen Prescription None      Goals/Expected Outcomes   Short Term Goals To learn and understand importance of maintaining oxygen saturations>88%;To learn and demonstrate proper use of respiratory medications;To learn and understand importance of monitoring SPO2 with pulse oximeter and demonstrate accurate use of the pulse oximeter.;To learn and demonstrate proper pursed lip breathing techniques or other breathing techniques.     Long  Term Goals Maintenance of O2 saturations>88%;Compliance with respiratory medication;Verbalizes importance of monitoring SPO2 with pulse oximeter and return demonstration;Exhibits proper breathing techniques, such as pursed lip breathing or other method taught during program session;Demonstrates proper use of MDI's    Goals/Expected Outcomes Compliance and understanding of oxygen saturation monitoring and breathing techniques to decrease shortness of breath.             Initial Exercise Prescription:   Perform Capillary Blood Glucose checks as needed.  Exercise Prescription Changes:   Exercise Prescription Changes     Row Name 09/10/22 1200 09/24/22 1500 10/03/22 1600 10/08/22 1500 10/22/22 1500     Response to Exercise   Blood Pressure (Admit) 94/62 108/69 -- 98/60 90/60   Blood Pressure (Exercise) 124/72 96/76 -- 122/64 124/66   Blood Pressure (Exit) 87/60 94/60 -- 92/58 100/64   Heart Rate (Admit) 105 bpm 97 bpm -- 56 bpm 78 bpm   Heart Rate (Exercise) 96 bpm 130 bpm -- 136 bpm 132 bpm   Heart Rate (Exit) 88 bpm 89 bpm -- 92 bpm 95 bpm   Oxygen Saturation (Admit) 95 % 96 % -- 97 % 95 %   Oxygen Saturation (Exercise) 96 % 95 % -- 96 % 96 %   Oxygen Saturation (Exit) 98 % 97 % -- 96 % 96 %   Rating of Perceived Exertion (Exercise) 12 12 -- 13 11   Perceived Dyspnea (Exercise) 3 3 -- 3 2.5   Duration Progress to 30 minutes of  aerobic without  signs/symptoms of physical distress Continue with 30 min of aerobic exercise without signs/symptoms of physical distress. -- Continue with 30 min of aerobic exercise without signs/symptoms of physical distress. Continue with 30 min of aerobic exercise without signs/symptoms of physical distress.   Intensity THRR unchanged THRR unchanged -- THRR unchanged THRR unchanged     Progression   Progression Continue to progress workloads to maintain intensity without signs/symptoms of physical distress. Continue to progress workloads to maintain intensity without signs/symptoms of physical distress. -- Continue to progress workloads to maintain intensity without signs/symptoms of physical distress. --     Resistance Training   Weight --  blue bands blue bands -- blue bands blue bands   Reps 10-15 10-15 -- 10-15 10-15   Time 10 Minutes 10 Minutes -- 10 Minutes 10 Minutes     Interval Training   Interval Training -- Yes -- Yes Yes   Equipment -- Treadmill -- Treadmill Treadmill   Comments -- @2mph /0.5, @ 2.16mph/3% -- 1 min @ 3.88mph and 3.5% and 2 min @ 3.5 mph and 1.0% 1 min @ 3.60mph and 3.5% and 2 min @ 3.5 mph and 1.0%     Treadmill   MPH 2 2 -- 3.5 3.5  Grade 0.5 3 -- 3.5 3.5   Minutes 15 15 -- 15 15   METs -- 3.36 -- 5.37 5.37     Recumbant Elliptical   Level 2 2 -- -- --   Minutes 15 15 -- -- --   METs 4.1 5.6 -- -- --     Rower   Level -- -- -- 2 3   Watts -- -- -- 27 34   Minutes -- -- -- 15 15     Home Exercise Plan   Plans to continue exercise at -- -- Home (comment) -- --   Frequency -- -- Add 1 additional day to program exercise sessions. -- --   Initial Home Exercises Provided -- -- 10/03/22 -- --    Row Name 11/05/22 1200             Response to Exercise   Blood Pressure (Admit) 98/54       Blood Pressure (Exercise) 100/62       Blood Pressure (Exit) 90/52       Heart Rate (Admit) 81 bpm       Heart Rate (Exercise) 137 bpm       Heart Rate (Exit) 96 bpm        Oxygen Saturation (Admit) 97 %       Oxygen Saturation (Exercise) 96 %       Oxygen Saturation (Exit) 96 %       Rating of Perceived Exertion (Exercise) 15       Perceived Dyspnea (Exercise) 3       Duration Continue with 30 min of aerobic exercise without signs/symptoms of physical distress.       Intensity THRR unchanged         Progression   Progression Continue to progress workloads to maintain intensity without signs/symptoms of physical distress.         Resistance Training   Weight black bands       Reps 10-15       Time 10 Minutes         Interval Training   Interval Training Yes       Equipment Treadmill       Comments 45 sec@5 .0 49min@ 3.0         Treadmill   MPH 5       Grade 0       Minutes 15       METs 8.66         Rower   Level 3       Watts 50       Minutes 15                Exercise Comments:   Exercise Comments     Row Name 09/05/22 1221 10/03/22 1555         Exercise Comments Pt completed first day of exercise. Terrilyn exercised for 15 min on the recumbent elliptical and treadmill. She averaged 2.8 METs at level 1 on the recumbent elliptical and 2.53 MET on the treadmill. Elizibeth performed the warmup and cooldown standing without limitations. Completed home exercise plan. Tayva is currently exercising at home. She is walking outside or on the treadmill 1-2 non-rehab days/wk for 30 min/day. Lillyana stated that she has increased her time from 10 min to 30 min. She is trying to progress to 3 non-rehab days of exercise. I encouraged her to increase to 3. She is also trying to progress herself by walking up hills. I  am satisfied with Mercedies's current home exercise and her goals to increase her home exercise. Nasra is very motivated to exercise and improve her functional capacity.               Exercise Goals and Review:   Exercise Goals     Row Name 08/30/22 1052 08/30/22 1352 09/24/22 0906 10/31/22 0857       Exercise Goals   Increase  Physical Activity Yes Yes Yes Yes    Intervention Provide advice, education, support and counseling about physical activity/exercise needs.;Develop an individualized exercise prescription for aerobic and resistive training based on initial evaluation findings, risk stratification, comorbidities and participant's personal goals. Provide advice, education, support and counseling about physical activity/exercise needs.;Develop an individualized exercise prescription for aerobic and resistive training based on initial evaluation findings, risk stratification, comorbidities and participant's personal goals. Provide advice, education, support and counseling about physical activity/exercise needs.;Develop an individualized exercise prescription for aerobic and resistive training based on initial evaluation findings, risk stratification, comorbidities and participant's personal goals. Provide advice, education, support and counseling about physical activity/exercise needs.;Develop an individualized exercise prescription for aerobic and resistive training based on initial evaluation findings, risk stratification, comorbidities and participant's personal goals.    Expected Outcomes Short Term: Attend rehab on a regular basis to increase amount of physical activity.;Long Term: Exercising regularly at least 3-5 days a week.;Long Term: Add in home exercise to make exercise part of routine and to increase amount of physical activity. Short Term: Attend rehab on a regular basis to increase amount of physical activity.;Long Term: Exercising regularly at least 3-5 days a week.;Long Term: Add in home exercise to make exercise part of routine and to increase amount of physical activity. Short Term: Attend rehab on a regular basis to increase amount of physical activity.;Long Term: Exercising regularly at least 3-5 days a week.;Long Term: Add in home exercise to make exercise part of routine and to increase amount of physical  activity. Short Term: Attend rehab on a regular basis to increase amount of physical activity.;Long Term: Exercising regularly at least 3-5 days a week.;Long Term: Add in home exercise to make exercise part of routine and to increase amount of physical activity.    Increase Strength and Stamina Yes Yes Yes Yes    Intervention Provide advice, education, support and counseling about physical activity/exercise needs.;Develop an individualized exercise prescription for aerobic and resistive training based on initial evaluation findings, risk stratification, comorbidities and participant's personal goals. Provide advice, education, support and counseling about physical activity/exercise needs.;Develop an individualized exercise prescription for aerobic and resistive training based on initial evaluation findings, risk stratification, comorbidities and participant's personal goals. Provide advice, education, support and counseling about physical activity/exercise needs.;Develop an individualized exercise prescription for aerobic and resistive training based on initial evaluation findings, risk stratification, comorbidities and participant's personal goals. Provide advice, education, support and counseling about physical activity/exercise needs.;Develop an individualized exercise prescription for aerobic and resistive training based on initial evaluation findings, risk stratification, comorbidities and participant's personal goals.    Expected Outcomes Short Term: Increase workloads from initial exercise prescription for resistance, speed, and METs.;Short Term: Perform resistance training exercises routinely during rehab and add in resistance training at home;Long Term: Improve cardiorespiratory fitness, muscular endurance and strength as measured by increased METs and functional capacity ( ) Short Term: Increase workloads from initial exercise prescription for resistance, speed, and METs.;Short Term: Perform  resistance training exercises routinely during rehab and add in resistance training at home;Long Term: Improve cardiorespiratory fitness,  muscular endurance and strength as measured by increased METs and functional capacity ( ) Short Term: Increase workloads from initial exercise prescription for resistance, speed, and METs.;Short Term: Perform resistance training exercises routinely during rehab and add in resistance training at home;Long Term: Improve cardiorespiratory fitness, muscular endurance and strength as measured by increased METs and functional capacity ( ) Short Term: Increase workloads from initial exercise prescription for resistance, speed, and METs.;Short Term: Perform resistance training exercises routinely during rehab and add in resistance training at home;Long Term: Improve cardiorespiratory fitness, muscular endurance and strength as measured by increased METs and functional capacity ( )    Able to understand and use rate of perceived exertion (RPE) scale Yes Yes Yes Yes    Intervention Provide education and explanation on how to use RPE scale Provide education and explanation on how to use RPE scale Provide education and explanation on how to use RPE scale Provide education and explanation on how to use RPE scale    Expected Outcomes Short Term: Able to use RPE daily in rehab to express subjective intensity level;Long Term:  Able to use RPE to guide intensity level when exercising independently Short Term: Able to use RPE daily in rehab to express subjective intensity level;Long Term:  Able to use RPE to guide intensity level when exercising independently Short Term: Able to use RPE daily in rehab to express subjective intensity level;Long Term:  Able to use RPE to guide intensity level when exercising independently Short Term: Able to use RPE daily in rehab to express subjective intensity level;Long Term:  Able to use RPE to guide intensity level when exercising independently     Able to understand and use Dyspnea scale Yes Yes Yes Yes    Intervention Provide education and explanation on how to use Dyspnea scale Provide education and explanation on how to use Dyspnea scale Provide education and explanation on how to use Dyspnea scale Provide education and explanation on how to use Dyspnea scale    Expected Outcomes Short Term: Able to use Dyspnea scale daily in rehab to express subjective sense of shortness of breath during exertion;Long Term: Able to use Dyspnea scale to guide intensity level when exercising independently Short Term: Able to use Dyspnea scale daily in rehab to express subjective sense of shortness of breath during exertion;Long Term: Able to use Dyspnea scale to guide intensity level when exercising independently Short Term: Able to use Dyspnea scale daily in rehab to express subjective sense of shortness of breath during exertion;Long Term: Able to use Dyspnea scale to guide intensity level when exercising independently Short Term: Able to use Dyspnea scale daily in rehab to express subjective sense of shortness of breath during exertion;Long Term: Able to use Dyspnea scale to guide intensity level when exercising independently    Knowledge and understanding of Target Heart Rate Range (THRR) Yes Yes Yes Yes    Intervention Provide education and explanation of THRR including how the numbers were predicted and where they are located for reference Provide education and explanation of THRR including how the numbers were predicted and where they are located for reference Provide education and explanation of THRR including how the numbers were predicted and where they are located for reference Provide education and explanation of THRR including how the numbers were predicted and where they are located for reference    Expected Outcomes Short Term: Able to state/look up THRR;Long Term: Able to use THRR to govern intensity when exercising independently;Short Term: Able to use  daily as  guideline for intensity in rehab Short Term: Able to state/look up THRR;Long Term: Able to use THRR to govern intensity when exercising independently;Short Term: Able to use daily as guideline for intensity in rehab Short Term: Able to state/look up THRR;Long Term: Able to use THRR to govern intensity when exercising independently;Short Term: Able to use daily as guideline for intensity in rehab Short Term: Able to state/look up THRR;Long Term: Able to use THRR to govern intensity when exercising independently;Short Term: Able to use daily as guideline for intensity in rehab    Able to check pulse independently -- -- -- --    Intervention -- -- -- --    Expected Outcomes -- -- -- --    Understanding of Exercise Prescription Yes Yes Yes Yes    Intervention Provide education, explanation, and written materials on patient's individual exercise prescription Provide education, explanation, and written materials on patient's individual exercise prescription Provide education, explanation, and written materials on patient's individual exercise prescription Provide education, explanation, and written materials on patient's individual exercise prescription    Expected Outcomes Short Term: Able to explain program exercise prescription;Long Term: Able to explain home exercise prescription to exercise independently Short Term: Able to explain program exercise prescription;Long Term: Able to explain home exercise prescription to exercise independently Short Term: Able to explain program exercise prescription;Long Term: Able to explain home exercise prescription to exercise independently Short Term: Able to explain program exercise prescription;Long Term: Able to explain home exercise prescription to exercise independently             Exercise Goals Re-Evaluation :  Exercise Goals Re-Evaluation     Row Name 08/30/22 1352 09/26/22 0651 10/31/22 0857         Exercise Goal Re-Evaluation   Exercise  Goals Review Increase Physical Activity;Able to understand and use Dyspnea scale;Understanding of Exercise Prescription;Increase Strength and Stamina;Knowledge and understanding of Target Heart Rate Range (THRR);Able to understand and use rate of perceived exertion (RPE) scale;Able to check pulse independently Increase Physical Activity;Able to understand and use Dyspnea scale;Understanding of Exercise Prescription;Increase Strength and Stamina;Knowledge and understanding of Target Heart Rate Range (THRR);Able to understand and use rate of perceived exertion (RPE) scale;Able to check pulse independently Increase Physical Activity;Able to understand and use Dyspnea scale;Understanding of Exercise Prescription;Increase Strength and Stamina;Knowledge and understanding of Target Heart Rate Range (THRR);Able to understand and use rate of perceived exertion (RPE) scale;Able to check pulse independently     Comments Patient is scheduled to begin exercise next week. Will continue to monitor and progress as able. Zafirah has completed 6 exercise sessions. She exercises for 15 min on the recumbent elliptical and treadmill. She averages 5.6 METs at level 2 on the recumbent elliptical and 3.48 METs on the treadmill. Aviella performs the warmup and cooldown standing without limitations. Meloday has increased her workload for both exercise modes. She tolereates progressions well. METs have also increased for both exercise modes. Jermaine has started doing interval training on the treadmill. Psalm does 1 min of 2.1 mph with 0.5% incline and 2.1 mph with 3.0% incline. Both intervals are somewhat easy for her. Will adjust intervals to increase intensity slightly. Safiyah is very motivated to exercise and always has a postive attitude. Will continue to monitor and progress as able. Liyana has completed 16 exercise sessions. She exercises for 15 min on the rower and treadmill. She averages 39 watts at level 3 on the rower and 8.66 METs  on the treadmill. Nahara jogs for 30 sec at  5 mph and walks for 2 min at 3 mph. Kinaya performs the warmup and cooldown standing without limitations. Estefana has made several progressions since starting the program. Makenna has switched from the recumbent elliptical and starting her jogging intervals. She tolerates both progressions very well. Alejandria is very motivated to exericise as she is willing to increase her workload every other week. I have discussed home exercise with Latessa. She does exercise at home as she has a treadmill to walk on and walks outside with her family. I am confident in Iysha carrying out an exercise regimen at home. Will continue to monitor and progress as able     Expected Outcomes Through exercise at rehab and home, the patient will decrease shortness of breath with daily activities and feel confident in carrying out an exercise regimen at home. Through exercise at rehab and home, the patient will decrease shortness of breath with daily activities and feel confident in carrying out an exercise regimen at home. Through exercise at rehab and home, the patient will decrease shortness of breath with daily activities and feel confident in carrying out an exercise regimen at home.              Discharge Exercise Prescription (Final Exercise Prescription Changes):  Exercise Prescription Changes - 11/05/22 1200       Response to Exercise   Blood Pressure (Admit) 98/54    Blood Pressure (Exercise) 100/62    Blood Pressure (Exit) 90/52    Heart Rate (Admit) 81 bpm    Heart Rate (Exercise) 137 bpm    Heart Rate (Exit) 96 bpm    Oxygen Saturation (Admit) 97 %    Oxygen Saturation (Exercise) 96 %    Oxygen Saturation (Exit) 96 %    Rating of Perceived Exertion (Exercise) 15    Perceived Dyspnea (Exercise) 3    Duration Continue with 30 min of aerobic exercise without signs/symptoms of physical distress.    Intensity THRR unchanged      Progression   Progression Continue to  progress workloads to maintain intensity without signs/symptoms of physical distress.      Resistance Training   Weight black bands    Reps 10-15    Time 10 Minutes      Interval Training   Interval Training Yes    Equipment Treadmill    Comments 45 sec@5 .0 85min@ 3.0      Treadmill   MPH 5    Grade 0    Minutes 15    METs 8.66      Rower   Level 3    Watts 50    Minutes 15             Nutrition:  Target Goals: Understanding of nutrition guidelines, daily intake of sodium 1500mg , cholesterol 200mg , calories 30% from fat and 7% or less from saturated fats, daily to have 5 or more servings of fruits and vegetables.  Biometrics:  Pre Biometrics - 08/30/22 1035       Pre Biometrics   Grip Strength 18 kg              Nutrition Therapy Plan and Nutrition Goals:  Nutrition Therapy & Goals - 10/29/22 1102       Nutrition Therapy   Diet General Healthful Diet      Personal Nutrition Goals   Nutrition Goal Patient to improve diet quality by using the plate method as a guide for meal planning to include lean protein/plant protein, fruits, vegetables,  whole grains, nonfat dairy as part of a well-balanced diet.    Comments Goals in action. She is trying to eat 3-4 smaller meals daily as she describes increased shortness of breath with larger meals. She is trying to increase protein to aid with lean muscle mass; she has implemented a protein shake daily ([employer] Power). Have reviewd the Mediterranean diet to aid with introducing/increasing anti-inflammatory foods. Makenah will follow-up with cardiology on 5/8 regarding low blood pressures; she reports adequate hydration. She has maintained her weight since starting with our program. Zana will continue to benefit from participation in pulmonary rehab for nutrition exercise and lfiestyle modification.      Intervention Plan   Intervention Prescribe, educate and counsel regarding individualized specific dietary modifications  aiming towards targeted core components such as weight, hypertension, lipid management, diabetes, heart failure and other comorbidities.;Nutrition handout(s) given to patient.    Expected Outcomes Short Term Goal: Understand basic principles of dietary content, such as calories, fat, sodium, cholesterol and nutrients.;Long Term Goal: Adherence to prescribed nutrition plan.             Nutrition Assessments:  Nutrition Assessments - 09/05/22 1126       Rate Your Plate Scores   Pre Score 69            MEDIFICTS Score Key: ?70 Need to make dietary changes  40-70 Heart Healthy Diet ? 40 Therapeutic Level Cholesterol Diet  Flowsheet Row Pulmonary Rehab Walk Test from 08/30/2022 in [facility]  Picture Your Plate Total Score on Admission 69      Picture Your Plate Scores: <82 Unhealthy dietary pattern with much room for improvement. 41-50 Dietary pattern unlikely to meet recommendations for good health and room for improvement. 51-60 More healthful dietary pattern, with some room for improvement.  >60 Healthy dietary pattern, although there may be some specific behaviors that could be improved.    Nutrition Goals Re-Evaluation:  Nutrition Goals Re-Evaluation     Row Name 09/05/22 1220 10/01/22 1448 10/29/22 1102         Goals   Current Weight 159 lb 13.3 oz (72.5 kg) 160 lb 7.9 oz (72.8 kg) 161 lb 6 oz (73.2 kg)     Comment lipids WNL labs WNL No new labs at this time; most recent labs WNL     Expected Outcome Mason reports improved appetite over the last two weeks. She is trying to eat 3-4 smaller meals daily as she describes increased shortness of breath with larger meals. She is trying to increase protein to aid with lean muscle mass. Will continue to discuss protein needs/intake and anti-inflammatory foods/mediteranean diet. Letasha will continue to benefit from participation in pulmonary rehab for nutrition  exercise and lfiestyle modification. Goals in action. She is trying to eat 3-4 smaller meals daily as she describes increased shortness of breath with larger meals. She is trying to increase protein to aid with lean muscle mass; she has implemented a protein shake daily (Core Power). Have reviewd the Mediterranean diet to aid with introducing/increasing anti-inflammatory foods. Zurii will follow-up with cardiology in May regarding low blood pressures; she reports adequate fluid intake. Mackayla will continue to benefit from participation in pulmonary rehab for nutrition exercise and lfiestyle modification. Goals in action. She is trying to eat 3-4 smaller meals daily as she describes increased shortness of breath with larger meals. She is trying to increase protein to aid with lean muscle mass; she has  implemented a protein shake daily (Core Power). Have reviewd the Mediterranean diet to aid with introducing/increasing anti-inflammatory foods. Dawnn will follow-up with cardiology on 5/8 regarding low blood pressures; she reports adequate hydration. She has maintained her weight since starting with our program. Kathline will continue to benefit from participation in pulmonary rehab for nutrition exercise and lfiestyle modification.              Nutrition Goals Discharge (Final Nutrition Goals Re-Evaluation):  Nutrition Goals Re-Evaluation - 10/29/22 1102       Goals   Current Weight 161 lb 6 oz (73.2 kg)    Comment No new labs at this time; most recent labs WNL    Expected Outcome Goals in action. She is trying to eat 3-4 smaller meals daily as she describes increased shortness of breath with larger meals. She is trying to increase protein to aid with lean muscle mass; she has implemented a protein shake daily (Core Power). Have reviewd the Mediterranean diet to aid with introducing/increasing anti-inflammatory foods. Dawnya will follow-up with cardiology on 5/8 regarding low blood pressures; she reports  adequate hydration. She has maintained her weight since starting with our program. Norleen will continue to benefit from participation in pulmonary rehab for nutrition exercise and lfiestyle modification.             Psychosocial: Target Goals: Acknowledge presence or absence of significant depression and/or stress, maximize coping skills, provide positive support system. Participant is able to verbalize types and ability to use techniques and skills needed for reducing stress and depression.  Initial Review & Psychosocial Screening:  Initial Psych Review & Screening - 08/30/22 1047       Initial Review   Current issues with Current Anxiety/Panic;Current Psychotropic Meds      Family Dynamics   Good Support System? Yes    Comments anxiety managed with meds      Barriers   Psychosocial barriers to participate in program There are no identifiable barriers or psychosocial needs.      Screening Interventions   Interventions Encouraged to exercise             Quality of Life Scores:  Scores of 19 and below usually indicate a poorer quality of life in these areas.  A difference of  2-3 points is a clinically meaningful difference.  A difference of 2-3 points in the total score of the Quality of Life Index has been associated with significant improvement in overall quality of life, self-image, physical symptoms, and general health in studies assessing change in quality of life.  PHQ-9: Review Flowsheet       08/30/2022  Depression screen PHQ 2/9  Decreased Interest 0  Down, Depressed, Hopeless 0  PHQ - 2 Score 0  Altered sleeping 0  Tired, decreased energy 0  Change in appetite 0  Feeling bad or failure about yourself  0  Trouble concentrating 0  Moving slowly or fidgety/restless 0  Suicidal thoughts 0  PHQ-9 Score 0  Difficult doing work/chores Somewhat difficult   Interpretation of Total Score  Total Score Depression Severity:  1-4 = Minimal depression, 5-9 = Mild  depression, 10-14 = Moderate depression, 15-19 = Moderately severe depression, 20-27 = Severe depression   Psychosocial Evaluation and Intervention:  Psychosocial Evaluation - 08/30/22 1205       Psychosocial Evaluation & Interventions   Interventions Encouraged to exercise with the program and follow exercise prescription    Comments Pt denies any psychosocial or barriers concerns  Expected Outcomes Pt will partcipate in PR without any psychosocial barriers or concerns    Continue Psychosocial Services  No Follow up required             Psychosocial Re-Evaluation:  Psychosocial Re-Evaluation     Row Name 08/30/22 1401 09/18/22 1527 10/28/22 1331         Psychosocial Re-Evaluation   Current issues with Current Anxiety/Panic;Current Psychotropic Meds Current Anxiety/Panic;Current Psychotropic Meds None Identified     Comments Pt has not started PR yet Pt denies any psychosocial barriers or concerns at this time Pt denies any psychosocial barriers or concerns at this time     Expected Outcomes For pt to participate in PR without any psychosocial barriers or concerns For pt to participate in PR without any psychosocial barriers or concerns For pt to participate in PR without any psychosocial barriers or concerns     Interventions Encouraged to attend Pulmonary Rehabilitation for the exercise Encouraged to attend Pulmonary Rehabilitation for the exercise Encouraged to attend Pulmonary Rehabilitation for the exercise     Continue Psychosocial Services  No Follow up required No Follow up required No Follow up required              Psychosocial Discharge (Final Psychosocial Re-Evaluation):  Psychosocial Re-Evaluation - 10/28/22 1331       Psychosocial Re-Evaluation   Current issues with None Identified    Comments Pt denies any psychosocial barriers or concerns at this time    Expected Outcomes For pt to participate in PR without any psychosocial barriers or concerns     Interventions Encouraged to attend Pulmonary Rehabilitation for the exercise    Continue Psychosocial Services  No Follow up required             Education: Education Goals: Education classes will be provided on a weekly basis, covering required topics. Participant will state understanding/return demonstration of topics presented.  Learning Barriers/Preferences:  Learning Barriers/Preferences - 08/30/22 1049       Learning Barriers/Preferences   Learning Barriers None    Learning Preferences None             Education Topics: Introduction to Pulmonary Rehab Group instruction provided by PowerPoint, verbal discussion, and written material to support subject matter. Instructor reviews what Pulmonary Rehab is, the purpose of the program, and how patients are referred.     Know Your Numbers Group instruction that is supported by a PowerPoint presentation. Instructor discusses importance of knowing and understanding resting, exercise, and post-exercise oxygen saturation, heart rate, and blood pressure. Oxygen saturation, heart rate, blood pressure, rating of perceived exertion, and dyspnea are reviewed along with a normal range for these values.  Flowsheet Row PULMONARY REHAB CHRONIC OBSTRUCTIVE PULMONARY DISEASE from 10/03/2022 in [facility], [facility]  Date 10/03/22  Educator EP  Instruction Review Code 1- Verbalizes Understanding       Exercise for the Pulmonary Patient Group instruction that is supported by a PowerPoint presentation. Instructor discusses benefits of exercise, core components of exercise, frequency, duration, and intensity of an exercise routine, importance of utilizing pulse oximetry during exercise, safety while exercising, and options of places to exercise outside of rehab.  Flowsheet Row PULMONARY REHAB CHRONIC OBSTRUCTIVE PULMONARY DISEASE from 09/26/2022 in [facility] for Heart,  Vascular, & Lung Health  Date 09/26/22  Educator EP  Instruction Review Code 1- Verbalizes Understanding          MET  Level  Group instruction provided by PowerPoint, verbal discussion, and written material to support subject matter. Instructor reviews what METs are and how to increase METs.  Flowsheet Row PULMONARY REHAB CHRONIC OBSTRUCTIVE PULMONARY DISEASE from 10/24/2022 in [facility] Heart, Vascular, & Lung Health  Date 10/24/22  Educator EP  Instruction Review Code 1- Verbalizes Understanding       Pulmonary Medications Verbally interactive group education provided by instructor with focus on inhaled medications and proper administration. Flowsheet Row PULMONARY REHAB CHRONIC OBSTRUCTIVE PULMONARY DISEASE from 09/19/2022 in [facility], Vascular, & [facility]  Date 09/19/22  Educator RT  Instruction Review Code 1- Verbalizes Understanding       Anatomy and Physiology of the Respiratory System Group instruction provided by PowerPoint, verbal discussion, and written material to support subject matter. Instructor reviews respiratory cycle and anatomical components of the respiratory system and their functions. Instructor also reviews differences in obstructive and restrictive respiratory diseases with examples of each.    Oxygen Safety Group instruction provided by PowerPoint, verbal discussion, and written material to support subject matter. There is an overview of "What is Oxygen" and "Why do we need it".  Instructor also reviews how to create a safe environment for oxygen use, the importance of using oxygen as prescribed, and the risks of noncompliance. There is a brief discussion on traveling with oxygen and resources the patient may utilize. Flowsheet Row PULMONARY REHAB CHRONIC OBSTRUCTIVE PULMONARY DISEASE from 10/10/2022 in [facility], Vascular, & Lung Health  Date 10/10/22   Educator RN  Instruction Review Code 1- Verbalizes Understanding       Oxygen Use Group instruction provided by PowerPoint, verbal discussion, and written material to discuss how supplemental oxygen is prescribed and different types of oxygen supply systems. Resources for more information are provided.  Flowsheet Row PULMONARY REHAB CHRONIC OBSTRUCTIVE PULMONARY DISEASE from 10/17/2022 in [facility], Vascular, [facility]  Date 10/17/22  Educator RT  Instruction Review Code 1- Verbalizes Understanding       Breathing Techniques Group instruction that is supported by demonstration and informational handouts. Instructor discusses the benefits of pursed lip and diaphragmatic breathing and detailed demonstration on how to perform both.  Flowsheet Row PULMONARY REHAB CHRONIC OBSTRUCTIVE PULMONARY DISEASE from 10/31/2022 in [facility] for Heart, Vascular, & Lung Health  Date 10/31/22  Educator RT  Instruction Review Code 1- Verbalizes Understanding        Risk Factor Reduction Group instruction that is supported by a PowerPoint presentation. Instructor discusses the definition of a risk factor, different risk factors for pulmonary disease, and how the heart and lungs work together.   MD Day A group question and answer session with a medical doctor that allows participants to ask questions that relate to their pulmonary disease state.   Nutrition for the Pulmonary Patient Group instruction provided by PowerPoint slides, verbal discussion, and written materials to support subject matter. The instructor gives an explanation and review of healthy diet recommendations, which includes a discussion on weight management, recommendations for fruit and vegetable consumption, as well as protein, fluid, caffeine, fiber, sodium, sugar, and alcohol. Tips for eating when patients are short of breath are discussed.    Other Education Group  or individual verbal, written, or video instructions that support the educational goals of the pulmonary rehab program.    Knowledge Questionnaire Score:  Knowledge Questionnaire Score -  08/30/22 1112       Knowledge Questionnaire Score   Pre Score 16/18             Core Components/Risk Factors/Patient Goals at Admission:  Personal Goals and Risk Factors at Admission - 08/30/22 1050       Core Components/Risk Factors/Patient Goals on Admission    Weight Management Weight Maintenance    Improve shortness of breath with ADL's Yes    Intervention Provide education, individualized exercise plan and daily activity instruction to help decrease symptoms of SOB with activities of daily living.    Expected Outcomes Short Term: Improve cardiorespiratory fitness to achieve a reduction of symptoms when performing ADLs    Increase knowledge of respiratory medications and ability to use respiratory devices properly  Yes    Intervention Provide education and demonstration as needed of appropriate use of medications, inhalers, and oxygen therapy.    Expected Outcomes Short Term: Achieves understanding of medications use. Understands that oxygen is a medication prescribed by physician. Demonstrates appropriate use of inhaler and oxygen therapy.;Long Term: Maintain appropriate use of medications, inhalers, and oxygen therapy.             Core Components/Risk Factors/Patient Goals Review:   Goals and Risk Factor Review     Row Name 08/30/22 1402 09/18/22 1531 10/28/22 1332         Core Components/Risk Factors/Patient Goals Review   Personal Goals Review Improve shortness of breath with ADL's;Develop more efficient breathing techniques such as purse lipped breathing and diaphragmatic breathing and practicing self-pacing with activity.;Increase knowledge of respiratory medications and ability to use respiratory devices properly. Develop more efficient breathing techniques such as purse lipped  breathing and diaphragmatic breathing and practicing self-pacing with activity.;Increase knowledge of respiratory medications and ability to use respiratory devices properly.;Improve shortness of breath with ADL's Improve shortness of breath with ADL's;Develop more efficient breathing techniques such as purse lipped breathing and diaphragmatic breathing and practicing self-pacing with activity.     Review Pt is scheduled to begin exercise next week. Will continue to monitor. Breonia has attended 4 sessions at this time. She is currently exercising on the Octane and the Treadmill. She has been able to increase her workload and METS on the Octane. She has also been able to increase her speed and incline on treadmill. Erandy is able to report her RPE and dyspnea scores. Aynslee enjoy's coming to class and feels like it is helping build her endurance. Acsa is doing very well in PR class. She brings her inhaler to class and has been able to demonstrate proper use to staff. She does still get SOB, but overall she feels it has gotten better. She uses purse lip breathing when she gets SOB. Ewelina is able to rate her perceived exertion and dyspnea scale when asked by staff. Janneth loves coming to class and feels it has helped her so much. She states it has given her hope and that she is able to keep up with her kids now which is something she couldn't do before.     Expected Outcomes See admission goals See admission Goals See admission Goals              Core Components/Risk Factors/Patient Goals at Discharge (Final Review):   Goals and Risk Factor Review - 10/28/22 1332       Core Components/Risk Factors/Patient Goals Review   Personal Goals Review Improve shortness of breath with ADL's;Develop more efficient breathing techniques such as purse lipped breathing  and diaphragmatic breathing and practicing self-pacing with activity.    Review Camelia is doing very well in PR class. She brings her inhaler to  class and has been able to demonstrate proper use to staff. She does still get SOB, but overall she feels it has gotten better. She uses purse lip breathing when she gets SOB. Chandel is able to rate her perceived exertion and dyspnea scale when asked by staff. Adrina loves coming to class and feels it has helped her so much. She states it has given her hope and that she is able to keep up with her kids now which is something she couldn't do before.    Expected Outcomes See admission Goals             ITP Comments:Pt is making expected progress toward Pulmonary Rehab goals after completing 18 sessions. Recommend continued exercise, life style modification, education, and utilization of breathing techniques to increase stamina and strength, while also decreasing shortness of breath with exertion.  Dr. Mechele Collin is Medical Director for Pulmonary Rehab at [facility].     Comments: Dr. Mechele Collin is Medical Director for Pulmonary Rehab at [facility].

## 2022-11-07 ENCOUNTER — Encounter (HOSPITAL_COMMUNITY)
Admission: RE | Admit: 2022-11-07 | Discharge: 2022-11-07 | Disposition: A | Discharge status: Home / Self Care | Payer: BC Managed Care – PPO | Source: Ambulatory Visit | Attending: Obstetrics and Gynecology | Admitting: Obstetrics and Gynecology

## 2022-11-07 DIAGNOSIS — R0602 Shortness of breath: Secondary | ICD-10-CM | POA: Diagnosis not present

## 2022-11-07 DIAGNOSIS — U099 Post covid-19 condition, unspecified: Secondary | ICD-10-CM

## 2022-11-07 NOTE — Progress Notes (Signed)
Daily Session Note  Patient Details  Name: Cheryl Wright MRN: [id number] Date of Birth: May 12, 1976 Referring Provider:    Encounter Date: 11/07/2022  Check In:  Session Check In - 11/07/22 1200       Check-In   Supervising physician immediately available to respond to emergencies [facility] MD immediately available    Physician(s) Eligha Bridegroom, NP    Location [facility]    Staff Present Samantha Belarus, RD, Dutch Gray, RN, BSN;Randi Reeve BS, ACSM-CEP, Exercise Physiologist;Kaylee Earlene Plater, MS, ACSM-CEP, Exercise Physiologist;Cambre Matson Katrinka Blazing, Pennelope Bracken, RN, BSN    Virtual Visit No    Medication changes reported     No    Fall or balance concerns reported    No    Tobacco Cessation No Change    Warm-up and Cool-down Performed as group-led instruction    Resistance Training Performed Yes    VAD Patient? No    PAD/SET Patient? No      Pain Assessment   Currently in Pain? No/denies    Pain Score 0-No pain    Multiple Pain Sites No             Capillary Blood Glucose: No results found for this or any previous visit (from the past 24 hour(s)).    Social History   Tobacco Use  Smoking Status Former   Years: 2   Types: Cigarettes  Smokeless Tobacco Never    Goals Met:  Proper associated with RPD/PD & O2 Sat Independence with exercise equipment Exercise tolerated well No report of concerns or symptoms today Strength training completed today  Goals Unmet:  Not Applicable  Comments: Service time is from 1013 to 1145.    Dr. Mechele Collin is Medical Director for Pulmonary Rehab at [facility].

## 2022-11-12 ENCOUNTER — Encounter (HOSPITAL_COMMUNITY)
Admission: RE | Admit: 2022-11-12 | Discharge: 2022-11-12 | Disposition: A | Discharge status: Home / Self Care | Payer: BC Managed Care – PPO | Source: Ambulatory Visit | Attending: Obstetrics and Gynecology | Admitting: Obstetrics and Gynecology

## 2022-11-12 DIAGNOSIS — R0602 Shortness of breath: Secondary | ICD-10-CM

## 2022-11-12 DIAGNOSIS — U099 Post covid-19 condition, unspecified: Secondary | ICD-10-CM

## 2022-11-12 NOTE — Progress Notes (Signed)
Daily Session Note  Patient Details  Name: Cheryl Wright MRN: [id number] Date of Birth: 03-14-1976 Referring Provider:    Encounter Date: 11/12/2022  Check In:  Session Check In - 11/12/22 1140       Check-In   Supervising physician immediately available to respond to emergencies [facility] MD immediately available    Physician(s) Bernadene Person, NP    [facility]    Staff Present Samantha Belarus, RD, Dutch Gray, RN, BSN;Randi Dionisio Paschal, ACSM-CEP, Exercise Physiologist;Carlette Les Pou, RN, Doris Cheadle, MS, ACSM-CEP, Exercise Physiologist;Casey Katrinka Blazing, RT    Virtual Visit No    Medication changes reported     No    Fall or balance concerns reported    No    Tobacco Cessation No Change    Warm-up and Cool-down Performed as group-led instruction    Resistance Training Performed Yes    VAD Patient? No    PAD/SET Patient? No      Pain Assessment   Currently in Pain? No/denies    Multiple Pain Sites No             Capillary Blood Glucose: No results found for this or any previous visit (from the past 24 hour(s)).    Social History   Tobacco Use  Smoking Status Former   Years: 2   Types: Cigarettes  Smokeless Tobacco Never    Goals Met:  Independence with exercise equipment Improved SOB with ADL's Exercise tolerated well No report of concerns or symptoms today Strength training completed today  Goals Unmet:  Not Applicable  Comments: Service time is from 1014 to 1150    Dr. Mechele Collin is Medical Director for Pulmonary Rehab at [facility].

## 2022-11-14 ENCOUNTER — Encounter (HOSPITAL_COMMUNITY)
Admission: RE | Admit: 2022-11-14 | Discharge: 2022-11-14 | Disposition: A | Discharge status: Home / Self Care | Payer: BC Managed Care – PPO | Source: Ambulatory Visit | Attending: Obstetrics and Gynecology | Admitting: Obstetrics and Gynecology

## 2022-11-14 ENCOUNTER — Telehealth: Payer: Self-pay

## 2022-11-14 DIAGNOSIS — R0602 Shortness of breath: Secondary | ICD-10-CM | POA: Diagnosis not present

## 2022-11-14 DIAGNOSIS — U099 Post covid-19 condition, unspecified: Secondary | ICD-10-CM

## 2022-11-14 NOTE — Telephone Encounter (Signed)
Call to pt reference PREP class.  Will finish up [facility] re[facility] 6/4, interested in participating in PREP. Would like the 12/09/22 class needs to verify schedule first. Given my number for call back

## 2022-11-14 NOTE — Progress Notes (Signed)
Daily Session Note  Patient Details  Name: Loan Sirat Pietrzyk MRN: [id number] Date of Birth: 20-Aug-1975 Referring Provider:    Encounter Date: 11/14/2022  Check In:  Session Check In - 11/14/22 1420       Check-In   Supervising physician immediately available to respond to emergencies [facility] MD immediately available    Physician(s) Robin Searing, NP    [facility]    Staff Present Samantha Belarus, RD, Dutch Gray, RN, BSN;Randi Dionisio Paschal, ACSM-CEP, Exercise Physiologist;Carlette Les Pou, RN, Doris Cheadle, MS, ACSM-CEP, Exercise Physiologist;Casey Katrinka Blazing, RT    Virtual Visit No    Medication changes reported     No    Fall or balance concerns reported    No    Tobacco Cessation No Change    Warm-up and Cool-down Performed as group-led instruction    Resistance Training Performed Yes    VAD Patient? No    PAD/SET Patient? No      Pain Assessment   Currently in Pain? No/denies    Pain Score 0-No pain    Multiple Pain Sites No             Capillary Blood Glucose: No results found for this or any previous visit (from the past 24 hour(s)).    Social History   Tobacco Use  Smoking Status Former   Years: 2   Types: Cigarettes  Smokeless Tobacco Never    Goals Met:  Independence with exercise equipment Exercise tolerated well No report of concerns or symptoms today Strength training completed today  Goals Unmet:  Not Applicable  Comments: Service time is from 1023 to 1205    Dr. Mechele Collin is Medical Director for Pulmonary Rehab at [facility].

## 2022-11-15 ENCOUNTER — Encounter (HOSPITAL_BASED_OUTPATIENT_CLINIC_OR_DEPARTMENT_OTHER): Payer: Self-pay | Admitting: Pulmonary Disease

## 2022-11-15 ENCOUNTER — Ambulatory Visit (HOSPITAL_BASED_OUTPATIENT_CLINIC_OR_DEPARTMENT_OTHER): Payer: BC Managed Care – PPO | Admitting: Pulmonary Disease

## 2022-11-15 VITALS — BP 102/66 | HR 86 | Temp 99.1°F | Ht 69.0 in | Wt 163.0 lb

## 2022-11-15 DIAGNOSIS — A319 Mycobacterial infection, unspecified: Secondary | ICD-10-CM | POA: Diagnosis not present

## 2022-11-15 MED ORDER — BUDESONIDE-FORMOTEROL FUMARATE 160-4.5 MCG/ACT IN AERO: 2.0000 | INHALATION_SPRAY | Freq: Two times a day (BID) | RESPIRATORY_TRACT | 11 refills | Status: DC

## 2022-11-15 NOTE — Progress Notes (Signed)
Subjective:   PATIENT ID: Cheryl Wright GENDER: female DOB: 1975/10/26, MRN: [id number]  Chief Complaint  Patient presents with   Follow-up    Follow up. Patient has no complaints.     Reason for Visit: Follow-up  Ms. Cheryl Wright is a 47 year old female former smoker with endometriosis, migraine, seasonal allergies and GAD who presents for follow-up for covid long hauler.  Initial consult She has had covid in September 2023 and again in December 2023. Her covid symptoms were improving until end of December when she began having more productive cough and left pleuritic chest pain. She was treated with Septra and albuterol inhaler 07/10/22 and improved cough but has worsening shortness of breath and congestion. Tested positive for flu B on 07/18/22. Stopped taking Septra after taking 7 days. Took tamiflu and completed it. And took levaquin when CXR showed possible pneumonia  She had a CT Chest on 07/30/22 with impression: 1. Reticulonodular and tree in bud type opacities in right middle lobe suspected be infectious or inflammatory in etiology. Consider atypical etiologies including MAI. 2. Several small lung nodules as detailed, 5 mm and less. No follow-up needed if patient is low risk. CXR report on 07/18/21 with interstitial changes at the bases however resolved on follow-up CXR 07/29/22.  She reports her nasal congestion and productive cough that worsened in the beginning of January. Went on a ski trip and felt sick during the trip. Worsening shortness of breath. After finishing her antibiotics she is laying in bed and feeling fatigued. She was evaluated in ED on 2/5. Treated with IV fluids, steroids. Has completed steroids but she feels it was not effective.  At baseline she reports she does have wheezing and cough when exposed to outdoor allergens. Uses saline nasal solution. Associated with chest tightness. Now she feels like she has chronic symptoms. Denies childhood asthma. Albuterol  initially helped but does not feel like albuterol inhaler anymore due to head spinning. Cough is less productive. Has nasal congestion. Has wheezing/whistle with laying down.  09/17/22 Since our last visit she has started pulmonary rehab. Has received one round steroids ordered on 08/16/22. No further steroids. She reports shortness of breath has improved. Still intermittently feeling fatigued, shortness of breath and chest pain. Albuterol use is variable. At least 1-2 times daily. Husband presents chart of her chronic chest pain, shortness of breath and fatigue which he reports can be subjective but notes she is overall feeling better.  During exercise at rehab she has had systolic BPs in the upper 80s.associated with dizziness. She reports good hydration and currently normotensive in clinic today.  11/15/22 She has completed pulmonary rehab and planning to start PREP program in June. She felt the program was beneficial. Her energy has improved but sometimes feel fatigued after exercising. Improved shortness of breath overall but still difficulty with increased humidity. Recent congestion in the last week. Some nasal drainage with dry cough. Occasionally uses [place].  Social History: Social smoker <10 years weekly. <1/2 ppd. Quit in 2202 Second hand smoke exposure  Past Medical History:  Diagnosis Date   Blood transfusion without reported diagnosis    pp hemorrhage 2011 twin delivery   Endometriosis    Frequent UTI    Headache(784.0)    migraine   Hx of preeclampsia, prior pregnancy, currently pregnant    Normal pregnancy 06/01/2012   SVD (spontaneous vaginal delivery) 06/02/2012     Family History  Problem Relation Age of Onset   Hyperlipidemia Mother  Hypertension Mother    Rheum arthritis Father    Lung cancer Father        mesothelioma from [employer]   Breast cancer Paternal Grandmother 67   Colon cancer Paternal Grandmother 64   Lung cancer Paternal Grandfather        dx in his  1s; smoker   Stroke Maternal Grandmother    Ovarian cancer Other        paternal grandfather's 2 sisters and mother in their 33s-60s   Breast cancer Other        father's paternal first cousin dx in her 41s   Breast cancer Other        paternal grandmother's mother dx in her 54s     Social History   Occupational History   Not on file  Tobacco Use   Smoking status: Former    Years: 2    Types: Cigarettes   Smokeless tobacco: Never  Substance and Sexual Activity   Alcohol use: Yes    Comment: 3-4/week   Drug use: No   Sexual activity: Yes    Comment: preg    Allergies  Allergen Reactions   Penicillins Anaphylaxis, Hives and Swelling    Childhood reaction     Outpatient Medications Prior to Visit  Medication Sig Dispense Refill   albuterol (PROVENTIL) (2.5 MG/3ML) 0.083% nebulizer solution Take 3 mLs (2.5 mg total) by nebulization every 6 (six) hours as needed for wheezing or shortness of breath. 75 mL 2   Albuterol-Budesonide (AIRSUPRA) 90-80 MCG/ACT AERO Inhale 2 puffs into the lungs every 4 (four) hours as needed. 10.7 g 5   ibuprofen (ADVIL,MOTRIN) 800 MG tablet Take 1 tablet (800 mg total) by mouth every 8 (eight) hours as needed for pain. 40 tablet 1   loratadine (CLARITIN) 10 MG tablet Take 10 mg by mouth daily.     norgestimate-ethinyl estradiol (ORTHO-CYCLEN) 0.25-35 MG-MCG tablet Take 1 tablet by mouth daily.     sertraline (ZOLOFT) 50 MG tablet Take 50 mg by mouth daily.     budesonide-formoterol (SYMBICORT) 160-4.5 MCG/ACT inhaler Inhale 2 puffs into the lungs in the morning and at bedtime. 1 each 3   albuterol (VENTOLIN HFA) 108 (90 Base) MCG/ACT inhaler SMARTSIG:1 Puff(s) Via Inhaler Every 4 Hours PRN (Patient not taking: Reported on 10/30/2022)     fluticasone (FLONASE) 50 MCG/ACT nasal spray Place 1 spray into both nostrils daily.     No facility-administered medications prior to visit.    Review of Systems  Constitutional:  Positive for malaise/fatigue.  Negative for chills, diaphoresis, fever and weight loss.  HENT:  Negative for congestion.   Respiratory:  Positive for shortness of breath. Negative for cough, hemoptysis, sputum production and wheezing.   Cardiovascular:  Negative for chest pain, palpitations and leg swelling.     Objective:   Vitals:   11/15/22 0932  BP: 102/66  Pulse: 86  Temp: 99.1 F (37.3 C)  TempSrc: Oral  SpO2: 98%  Weight: 163 lb (73.9 kg)  Height: 5\' 9"  (1.753 m)   SpO2: 98 % O2 Device: None (Room air)  Physical Exam: General: Well-appearing, no acute distress HENT: [place], AT Eyes: EOMI, no scleral icterus Respiratory: Clear to auscultation bilaterally.  No crackles, wheezing or rales Cardiovascular: RRR, -M/R/G, no JVD Extremities:-Edema,-tenderness Neuro: AAO x4, CNII-XII grossly intact Psych: Normal mood, normal affect  Data Reviewed:  Imaging: CT Chest 07/30/22 1. Reticulonodular and tree-in-bud type opacities noted in the right  middle lobe suspected to be infectious  or inflammatory in etiology.  Consider atypical etiologies including MAI.  2. Several small lung nodules as detailed, 5 mm and less. No  follow-up needed if patient is low-risk (and has no known or  suspected primary neoplasm).   PFT: 09/17/22 FVC 4.43 (107%) FEV1 3.19 (97%) Ratio 72  TLC 124% DLCO 98% Interpretation: Normal spirometry. Mild hyperinflation and air trapping. Normal gas exchange.  Labs: CBC    Component Value Date/Time   WBC 10.6 (H) 07/29/2022 1425   RBC 4.26 07/29/2022 1425   HGB 14.3 07/29/2022 1425   HCT 41.3 07/29/2022 1425   PLT 289 07/29/2022 1425   MCV 96.9 07/29/2022 1425   MCH 33.6 07/29/2022 1425   MCHC 34.6 07/29/2022 1425   RDW 12.2 07/29/2022 1425   Abs eos 07/18/22 100     Assessment & Plan:   Discussion: 47 year old female former smoker with endometriosis, migraine, seasonal allergies and GAD who presents for follow-up for covid long hauler. Improving stamina and symptoms after  graduating pulmonary rehab. Still intermittently fatigued and short of breath but attributes this to intense exercise days. Will persistent symptoms, will evaluate with CT again   MAI/Atypical infection on CT Clinically low suspicion for this. Previously reviewed CD however not uploaded system --ORDER CT chest without contrast  Post-covid long hauler  Chronic bronchitis --CONTINUE Symbicort 160-4.5 mcg TWO puffs in the morning and evening. Rinse mouth out after use to prevent thrush --CONTINUE Airsupra 1-2 puffs AS NEEDED every 4 hours --CONTINUE exercise with SPEAR program  Low-normal BP --No additional work-up per Cardiology  Health Maintenance Immunization History  Administered Date(s) Administered   Influenza Split 03/22/2014   Influenza,inj,Quad PF,6+ Mos 05/23/2021, 05/24/2022   PFIZER(Purple Top)SARS-COV-2 Vaccination 09/10/2019, 10/05/2019, 05/25/2020   Pfizer Covid-19 Vaccine Bivalent Booster 101yrs & up 07/14/2021   Tdap 03/26/2012, 05/24/2022   CT Lung Screen - not qualifed  Orders Placed This Encounter  Procedures   CT Chest Wo Contrast    Standing Status:   Future    Standing Expiration Date:   11/15/2023    Scheduling Instructions:     Schedule for August 2024    Order Specific Question:   Is patient pregnant?    Answer:   No    Order Specific Question:   Preferred imaging location?    Answer:   [facility]   Meds ordered this encounter  Medications   budesonide-formoterol (SYMBICORT) 160-4.5 MCG/ACT inhaler    Sig: Inhale 2 puffs into the lungs in the morning and at bedtime.    Dispense:  1 each    Refill:  11    Return in about 3 months (around 02/15/2023) for after CT scan.   I have spent a total time of 35-minutes on the day of the appointment including chart review, data review, collecting history, coordinating care and discussing medical diagnosis and plan with the patient/family. Past medical history, allergies, medications were reviewed.  Pertinent imaging, labs and tests included in this note have been reviewed and interpreted independently by me.  Analisa Sledd Mechele Collin, MD  Pulmonary Critical Care 11/15/2022 3:00 PM  Office Number [phone]

## 2022-11-15 NOTE — Patient Instructions (Signed)
MAI/Atypical infection on CT Clinically low suspicion for this. Previously reviewed CD however not uploaded system --ORDER CT chest without contrast  Post-covid long hauler  Chronic bronchitis --CONTINUE Symbicort 160-4.5 mcg TWO puffs in the morning and evening. Rinse mouth out after use to prevent thrush --CONTINUE Airsupra 1-2 puffs AS NEEDED every 4 hours --CONTINUE exercise with SPEAR program

## 2022-11-19 ENCOUNTER — Encounter (HOSPITAL_COMMUNITY)
Admission: RE | Admit: 2022-11-19 | Discharge: 2022-11-19 | Disposition: A | Discharge status: Home / Self Care | Payer: BC Managed Care – PPO | Source: Ambulatory Visit | Attending: Obstetrics and Gynecology | Admitting: Obstetrics and Gynecology

## 2022-11-19 VITALS — Wt 160.3 lb

## 2022-11-19 DIAGNOSIS — R0602 Shortness of breath: Secondary | ICD-10-CM

## 2022-11-19 DIAGNOSIS — U099 Post covid-19 condition, unspecified: Secondary | ICD-10-CM

## 2022-11-19 NOTE — Progress Notes (Signed)
Daily Session Note  Patient Details  Name: Cheryl Wright MRN: [id number] Date of Birth: [birth year] Referring Provider:    Encounter Date: 11/19/2022  Check In:  Session Check In - 11/19/22 1155       Check-In   Supervising physician immediately available to respond to emergencies [facility] MD immediately available    Physician(s) Robin Searing, NP    Location [facility]    Staff Present Samantha Belarus, RD, Dutch Gray, RN, BSN;Randi Dionisio Paschal, ACSM-CEP, Exercise Physiologist;Carlette Les Pou, RN, Doris Cheadle, MS, ACSM-CEP, Exercise Physiologist;Casey Katrinka Blazing, RT    Virtual Visit No    Medication changes reported     No    Fall or balance concerns reported    No    Tobacco Cessation No Change    Warm-up and Cool-down Performed as group-led instruction    Resistance Training Performed Yes    VAD Patient? No    PAD/SET Patient? No      Pain Assessment   Currently in Pain? No/denies    Pain Score 0-No pain    Multiple Pain Sites No             Capillary Blood Glucose: No results found for this or any previous visit (from the past 24 hour(s)).   Exercise Prescription Changes - 11/19/22 1200       Response to Exercise   Blood Pressure (Admit) 96/62    Blood Pressure (Exercise) 110/60    Blood Pressure (Exit) 90/64    Heart Rate (Admit) 100 bpm    Heart Rate (Exercise) 157 bpm    Heart Rate (Exit) 101 bpm    Oxygen Saturation (Admit) 97 %    Oxygen Saturation (Exercise) 95 %    Oxygen Saturation (Exit) 96 %    Rating of Perceived Exertion (Exercise) 13    Perceived Dyspnea (Exercise) 3    Duration Continue with 30 min of aerobic exercise without signs/symptoms of physical distress.    Intensity THRR unchanged      Progression   Progression Continue to progress workloads to maintain intensity without signs/symptoms of physical distress.      Resistance Training   Weight black bands    Reps 10-15    Time 10 Minutes      Interval Training    Interval Training Yes    Equipment Treadmill    Comments 45 sec @ 5.0/59min @ 3.1      Treadmill   MPH 5    Grade 0    Minutes 15    METs 7.1      Rower   Level 4    Watts 45    Minutes 15             Social History   Tobacco Use  Smoking Status Former   Years: 2   Types: Cigarettes  Smokeless Tobacco Never    Goals Met:  Proper associated with RPD/PD & O2 Sat Exercise tolerated well No report of concerns or symptoms today Strength training completed today  Goals Unmet:  Not Applicable  Comments: Service time is from 1013 to 1141.    Dr. Mechele Collin is Medical Director for Pulmonary Rehab at [facility].

## 2022-11-20 ENCOUNTER — Encounter: Payer: Self-pay | Admitting: Internal Medicine

## 2022-11-20 ENCOUNTER — Ambulatory Visit: Payer: BC Managed Care – PPO | Attending: Internal Medicine | Admitting: Internal Medicine

## 2022-11-20 VITALS — BP 132/78 | HR 73 | Resp 14 | Ht 68.25 in | Wt 160.0 lb

## 2022-11-20 DIAGNOSIS — U099 Post covid-19 condition, unspecified: Secondary | ICD-10-CM

## 2022-11-20 DIAGNOSIS — M2550 Pain in unspecified joint: Secondary | ICD-10-CM | POA: Diagnosis not present

## 2022-11-20 LAB — SEDIMENTATION RATE: Sed Rate: 2 mm/h (ref 0–20)

## 2022-11-20 NOTE — Progress Notes (Signed)
Office Visit Note  Patient: Cheryl Wright             Date of Birth: [birth year]           MRN: [id number]             PCP: Sherian Rein, MD Referring: Luciano Cutter, MD Visit Date: 11/20/2022 Occupation: Teacher  Subjective:  New Patient (Initial Visit) (Patient states she was sent by her Pulmonologist. Patient states she has extreme fatigue. Patient states she had joint pain in January that would come and go. Patient states she was taking tylenol daily for the joint pain. )   History of Present Illness: Cheryl Wright is a 47 y.o. female here for evaluation of joint pain in multiple areas also persistent fatigue symptoms have been ongoing since January.  Before that she was sick with COVID in September and again in December of last year never had complete resolution of symptoms during the interval.  After getting sick in December had associated productive cough and sharp chest pain this is treated with antibiotics and inhaler treatment.  Also complicated by subsequent influenza B infection found on testing January 25.  Due to persistent respiratory symptoms and abnormal imaging findings she saw Dr. Everardo All in pulmonology clinic.  She had pulmonary function testing and started treatment on inhaler therapy and started working with pulmonary rehab.  She is also had a profound level of fatigue with limited ability to get up or perform activities on some days.  Prior to last year she was very physically active including running and biking and had never experienced similar symptoms.  Over the past 4 months symptoms have partially improved still has days where she tolerates almost 0 physical activity but on other days is able to perform exercises fairly well. Also had some increase in some joint pain in multiple areas she was taking Tylenol for this.  This has also improved and discontinued taking Tylenol for the past few weeks without severe symptom worsening.  Did not notice any  visible joint swelling no warmth or discoloration.  Does not have prolonged morning stiffness. She has family history of autoimmune disease father has rheumatoid arthritis and her brother has Crohn's disease.  Activities of Daily Living:  Patient reports morning stiffness for 10-15 minutes.   Patient Reports nocturnal pain.  Difficulty dressing/grooming: Denies Difficulty climbing stairs: Denies Difficulty getting out of chair: Denies Difficulty using hands for taps, buttons, cutlery, and/or writing: Denies  Review of Systems  Constitutional:  Positive for fatigue.  HENT:  Positive for mouth dryness. Negative for mouth sores.   Eyes:  Positive for dryness.  Respiratory:  Positive for shortness of breath.   Cardiovascular:  Negative for chest pain and palpitations.  Gastrointestinal:  Negative for blood in stool, constipation and diarrhea.  Endocrine: Negative for increased urination.  Genitourinary:  Negative for involuntary urination.  Musculoskeletal:  Positive for joint pain, joint pain, myalgias, morning stiffness, muscle tenderness and myalgias. Negative for gait problem, joint swelling and muscle weakness.  Skin:  Positive for sensitivity to sunlight. Negative for color change, rash and hair loss.  Allergic/Immunologic: Positive for susceptible to infections.  Neurological:  Positive for dizziness and headaches.  Hematological:  Negative for swollen glands.  Psychiatric/Behavioral:  Positive for sleep disturbance. Negative for depressed mood. The patient is nervous/anxious.     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  SVD (spontaneous vaginal delivery) 06/02/2012   Normal pregnancy 06/01/2012    Past Medical History:  Diagnosis Date   Blood transfusion without reported diagnosis    pp hemorrhage 2011 twin delivery   Endometriosis    stage four   Frequent UTI    Headache(784.0)     migraine   Hx of preeclampsia, prior pregnancy, currently pregnant    Long COVID    Patient states caused lung damage   Normal pregnancy 06/01/2012   SVD (spontaneous vaginal delivery) 06/02/2012    Family History  Problem Relation Age of Onset   Hyperlipidemia Mother    Hypertension Mother    Osteoporosis Mother    Osteoarthritis Mother    Rheum arthritis Father    Lung cancer Father        mesothelioma from [place]   Stroke Maternal Grandmother    Breast cancer Paternal Grandmother 29   Colon cancer Paternal Grandmother 13   Lung cancer Paternal Grandfather        dx in his 73s; smoker   Ovarian cancer Other        paternal grandfather's 2 sisters and mother in their 61s-60s   Breast cancer Other        father's paternal first cousin dx in her 69s   Breast cancer Other        paternal grandmother's mother dx in her 14s   Past Surgical History:  Procedure Laterality Date   ANKLE SURGERY     BLADDER SURGERY     dilation   LAPAROSCOPY     LYSIS OF ADHESION     OVARIAN CYST REMOVAL     removed two   Social History   Social History Narrative   Lives with husband and 3 kids.    Immunization History  Administered Date(s) Administered   Influenza Split 03/22/2014   Influenza,inj,Quad PF,6+ Mos 05/23/2021, 05/24/2022   PFIZER(Purple Top)SARS-COV-2 Vaccination 09/10/2019, 10/05/2019, 05/25/2020   Pfizer Covid-19 Vaccine Bivalent Booster 14yrs & up 07/14/2021   Tdap 03/26/2012, 05/24/2022     Objective: Vital Signs: BP 132/78 (BP Location: Right Arm, Patient Position: Sitting, Cuff Size: Normal)   Pulse 73   Resp 14   Ht 5' 8.25" (1.734 m)   Wt 160 lb (72.6 kg)   LMP 10/27/2022   Breastfeeding No   BMI 24.15 kg/m    Physical Exam HENT:     Mouth/Throat:     Mouth: Mucous membranes are moist.     Pharynx: Oropharynx is clear.  Eyes:     Conjunctiva/sclera: Conjunctivae normal.  Cardiovascular:     Rate and Rhythm: Normal rate and regular rhythm.  Pulmonary:      Effort: Pulmonary effort is normal.     Breath sounds: Normal breath sounds.  Musculoskeletal:     Right lower leg: No edema.     Left lower leg: No edema.  Lymphadenopathy:     Cervical: No cervical adenopathy.  Skin:    General: Skin is warm and dry.     Findings: No rash.     Comments: Few telangiectasias on face and upper chest  Neurological:     Mental Status: She is alert.  Psychiatric:        Mood and Affect: Mood normal.      Musculoskeletal Exam:  Shoulders full ROM no tenderness or swelling Elbows full ROM no tenderness or swelling Wrists full ROM no tenderness or swelling Fingers full ROM no tenderness or swelling Left hip internal rotation slightly  less than right hip, mildly tender to lateral hip pressure Knees full ROM no tenderness or swelling Ankles full ROM no tenderness or swelling, bone spurring at achilles insertion MTPs full ROM slight left 1st MTP bunion   Investigation: No additional findings.  Imaging: No results found.  Recent Labs: Lab Results  Component Value Date   WBC 10.6 (H) 07/29/2022   HGB 14.3 07/29/2022   PLT 289 07/29/2022   NA 137 07/29/2022   K 3.9 07/29/2022   CL 103 07/29/2022   CO2 23 07/29/2022   GLUCOSE 120 (H) 07/29/2022   BUN 15 07/29/2022   CREATININE 0.73 07/29/2022   CALCIUM 10.0 07/29/2022    Speciality Comments: No specialty comments available.  Procedures:  No procedures performed Allergies: Penicillins   Assessment / Plan:     Visit Diagnoses: Polyarthralgia - Plan: ANA, Sedimentation rate, C-reactive protein, Rheumatoid factor, Cyclic citrul peptide antibody, IgG  Joint pain in multiple areas and increased fatigue level no objective synovitis present on exam today.  Overall have a lower suspicion for new onset of inflammatory arthritis and symptoms have progressively improved without disease specific treatment.  Will check antibody markers today as well as sed rate and CRP for evidence of active  inflammation.  Although with limited symptoms if these are abnormal would probably recommend observation with scheduled follow-up rather than starting immunomodulatory medication.  COVID-19 long hauler  Discussed possibility of abnormal antibody testing associated with a post-COVID inflammatory process.  We usually expect a self-limited condition in that case but would warrant scheduled follow up.  Orders: Orders Placed This Encounter  Procedures   ANA   Sedimentation rate   C-reactive protein   Rheumatoid factor   Cyclic citrul peptide antibody, IgG   No orders of the defined types were placed in this encounter.    Follow-Up Instructions: Return if symptoms worsen or fail to improve.   Fuller Plan, MD  Note - This record has been created using [employer].  Chart creation errors have been sought, but may not always  have been located. Such creation errors do not reflect on  the standard of medical care.

## 2022-11-21 ENCOUNTER — Encounter (HOSPITAL_COMMUNITY)
Admission: RE | Admit: 2022-11-21 | Discharge: 2022-11-21 | Disposition: A | Discharge status: Home / Self Care | Payer: BC Managed Care – PPO | Source: Ambulatory Visit | Attending: Obstetrics and Gynecology | Admitting: Obstetrics and Gynecology

## 2022-11-21 DIAGNOSIS — U099 Post covid-19 condition, unspecified: Secondary | ICD-10-CM

## 2022-11-21 DIAGNOSIS — R0602 Shortness of breath: Secondary | ICD-10-CM

## 2022-11-21 NOTE — Progress Notes (Addendum)
Daily Session Note  Patient Details  Name: Cheryl Wright MRN: [id number] Date of Birth: [birth year] Referring Provider:    Encounter Date: 11/21/2022  Check In:  Session Check In - 11/21/22 1218       Check-In   Supervising physician immediately available to respond to emergencies [facility] MD immediately available    Physician(s) Edd Fabian    Location [facility]    Staff Present Samantha Belarus, RD, Dutch Gray, RN, BSN;Randi Reeve BS, ACSM-CEP, Exercise Physiologist;Kaylee Earlene Plater, MS, ACSM-CEP, Exercise Physiologist;Quindarius Cabello Chester Holstein, MS, Exercise Physiologist    Virtual Visit No    Medication changes reported     No    Fall or balance concerns reported    No    Tobacco Cessation No Change    Warm-up and Cool-down Performed as group-led instruction    Resistance Training Performed Yes    VAD Patient? No    PAD/SET Patient? No      Pain Assessment   Currently in Pain? No/denies    Pain Score 0-No pain    Multiple Pain Sites No             Capillary Blood Glucose: No results found for this or any previous visit (from the past 24 hour(s)).    Social History   Tobacco Use  Smoking Status Former   Years: 2   Types: Cigarettes   Passive exposure: Past  Smokeless Tobacco Never    Goals Met:  Proper associated with RPD/PD & O2 Sat Independence with exercise equipment Exercise tolerated well No report of concerns or symptoms today Strength training completed today  Goals Unmet:  Not Applicable  Comments: Service time is from 1022 to 1142.    Dr. Mechele Collin is Medical Director for Pulmonary Rehab at [facility].

## 2022-11-22 LAB — C-REACTIVE PROTEIN: CRP: 6 mg/L (ref <8.0)

## 2022-11-22 LAB — ANTI-NUCLEAR AB-TITER (ANA TITER): ANA Titer 1: 1:40 {titer} — ABNORMAL HIGH

## 2022-11-22 LAB — ANA: Anti Nuclear Antibody (ANA): POSITIVE — AB

## 2022-11-22 LAB — RHEUMATOID FACTOR: Rheumatoid fact SerPl-aCnc: <10 IU/mL (ref <14)

## 2022-11-22 LAB — CYCLIC CITRUL PEPTIDE ANTIBODY, IGG: Cyclic Citrullin Peptide Ab: <16 UNITS

## 2022-11-24 NOTE — Progress Notes (Signed)
Lab results are normal for markers of inflammation and negative for rheumatoid arthritis antibodies. The ANA test is positive at a borderline level 1: 40.  I expect this is more of an incidental finding but as we discussed can be associated with post COVID inflammatory response.  I do not recommend starting any new drugs for this currently but we could schedule to follow-up in 6 months if symptoms are persisting to recheck this this abnormal lab result.

## 2022-11-25 ENCOUNTER — Telehealth: Payer: Self-pay

## 2022-11-25 NOTE — Telephone Encounter (Signed)
-----   Message from Fuller Plan, MD sent at 11/24/2022  9:20 AM EDT ----- Lab results are normal for markers of inflammation and negative for rheumatoid arthritis antibodies. The ANA test is positive at a borderline level 1: 40.  I expect this is more of an incidental finding but as we discussed can be associated with post COVID inflammatory response.  I do not recommend starting any new drugs for this currently but we co uld schedule to follow-up in 6 months if symptoms are persisting to recheck this this abnormal lab result.

## 2022-11-25 NOTE — Telephone Encounter (Signed)
Please schedule patient a 6 month follow up visit per Dr. Gregary Cromer result note.

## 2022-11-25 NOTE — Telephone Encounter (Signed)
LMOM for patient to call and schedule 6 month follow-up appointment. 

## 2022-11-26 ENCOUNTER — Encounter (HOSPITAL_COMMUNITY)
Admission: RE | Admit: 2022-11-26 | Discharge: 2022-11-26 | Disposition: A | Discharge status: Home / Self Care | Payer: BC Managed Care – PPO | Source: Ambulatory Visit | Attending: Obstetrics and Gynecology | Admitting: Obstetrics and Gynecology

## 2022-11-26 DIAGNOSIS — U099 Post covid-19 condition, unspecified: Secondary | ICD-10-CM | POA: Diagnosis present

## 2022-11-26 DIAGNOSIS — R0602 Shortness of breath: Secondary | ICD-10-CM | POA: Insufficient documentation

## 2022-11-26 NOTE — Progress Notes (Signed)
Daily Session Note  Patient Details  Name: Cheryl Wright MRN: [id number] Date of Birth: 01-Jul-1975 Referring Provider:    Encounter Date: 11/26/2022  Check In:  Session Check In - 11/26/22 0939       Check-In   Supervising physician immediately available to respond to emergencies [facility] MD immediately available    Physician(s) Carlyon Shadow, NP    Location [facility]    Staff Present Samantha Belarus, RD, LDN;Randi Dionisio Paschal, ACSM-CEP, Exercise Physiologist;Posey Jasmin Earlene Plater, MS, ACSM-CEP, Exercise Physiologist;David Makemson, MS, ACSM-CEP, CCRP, Exercise Physiologist    Virtual Visit No    Medication changes reported     No    Fall or balance concerns reported    No    Tobacco Cessation No Change    Warm-up and Cool-down Performed as group-led instruction    Resistance Training Performed Yes    VAD Patient? No    PAD/SET Patient? No      Pain Assessment   Currently in Pain? No/denies    Pain Score 0-No pain    Multiple Pain Sites No             Capillary Blood Glucose: No results found for this or any previous visit (from the past 24 hour(s)).    Social History   Tobacco Use  Smoking Status Former   Years: 2   Types: Cigarettes   Passive exposure: Past  Smokeless Tobacco Never    Goals Met:  Proper associated with RPD/PD & O2 Sat Independence with exercise equipment Exercise tolerated well No report of concerns or symptoms today Strength training completed today  Goals Unmet:  Not Applicable  Comments: Service time is from 0810 to 0915.    Dr. Mechele Collin is Medical Director for Pulmonary Rehab at [facility].

## 2022-11-27 ENCOUNTER — Other Ambulatory Visit: Payer: Self-pay | Admitting: *Deleted

## 2022-11-27 ENCOUNTER — Telehealth: Payer: Self-pay | Admitting: Internal Medicine

## 2022-11-27 DIAGNOSIS — U099 Post covid-19 condition, unspecified: Secondary | ICD-10-CM

## 2022-11-27 NOTE — Telephone Encounter (Signed)
It looks like you already talked to the patient? If not please call her back about this, discussed in associated result note.

## 2022-11-27 NOTE — Telephone Encounter (Signed)
Recommend referral to [facility] COVID clinic. According to website I think we need to fax them:  External (Outside [facility]) Providers: Please submit a referral packet by fax to the [facility] Recovery [facility]. The referral packet must include recent medical records and pertinent diagnostic testing.  Fax # [phone].

## 2022-11-27 NOTE — Telephone Encounter (Signed)
I called, patient will call to schedule appt.

## 2022-11-27 NOTE — Telephone Encounter (Signed)
Patient called requesting a return call regarding her labwork results.  Patient is asking if her auto immune symptoms could be caused from her diagnosis of Covid 6 months ago.

## 2022-11-27 NOTE — Telephone Encounter (Signed)
Patient advised of the lab results in her result note from 11/20/2022. Patient states she has been extremely tired and exhausted since COVID-19. Patient states she has been in rehab for her pulmonary issues for about 12 weeks now. Patient states whenever she is at these appointments moving around she gets very tired afterward and the next day the patient is hardly able to do anything. Patient states if she tries to live her normal life, she will pay for it for the next couple of days after. Patient inquires if there is anything she can do or any suggestions to try to help her with exhaustion. Patient states it has been six months since she has had Covid. Patient call back number is [phone]. Please advise.

## 2022-11-28 ENCOUNTER — Encounter (HOSPITAL_COMMUNITY)
Admission: RE | Admit: 2022-11-28 | Discharge: 2022-11-28 | Disposition: A | Discharge status: Home / Self Care | Payer: BC Managed Care – PPO | Source: Ambulatory Visit | Attending: Pulmonary Disease | Admitting: Pulmonary Disease

## 2022-11-28 DIAGNOSIS — R0602 Shortness of breath: Secondary | ICD-10-CM | POA: Diagnosis not present

## 2022-11-28 DIAGNOSIS — U099 Post covid-19 condition, unspecified: Secondary | ICD-10-CM

## 2022-11-28 NOTE — Progress Notes (Signed)
Daily Session Note  Patient Details  Name: Cheryl Wright MRN: [id number] Date of Birth: [birth year] Referring Provider:    Encounter Date: 11/28/2022  Check In:  Session Check In - 11/28/22 0916       Check-In   Supervising physician immediately available to respond to emergencies [facility] MD immediately available    Physician(s) Jari Favre, PA    Location [facility]    Staff Present Samantha Belarus, RD, LDN;Gentle Hoge Dionisio Paschal, ACSM-CEP, Exercise Physiologist;Kaylee Earlene Plater, MS, ACSM-CEP, Exercise Physiologist;Casey Katrinka Blazing, RT    Virtual Visit No    Medication changes reported     No    Fall or balance concerns reported    No    Tobacco Cessation No Change    Warm-up and Cool-down Performed as group-led instruction    Resistance Training Performed Yes    VAD Patient? No    PAD/SET Patient? No      Pain Assessment   Currently in Pain? No/denies    Multiple Pain Sites No             Capillary Blood Glucose: No results found for this or any previous visit (from the past 24 hour(s)).    Social History   Tobacco Use  Smoking Status Former   Years: 2   Types: Cigarettes   Passive exposure: Past  Smokeless Tobacco Never    Goals Met:  Independence with exercise equipment Exercise tolerated well No report of concerns or symptoms today Strength training completed today  Goals Unmet:  Not Applicable  Comments: Service time is from 0822 to 0930.    Dr. Mechele Collin is Medical Director for Pulmonary Rehab at [facility].

## 2022-11-29 ENCOUNTER — Telehealth: Payer: Self-pay

## 2022-11-29 NOTE — Telephone Encounter (Signed)
VMT pt requesting call back to schedule intake appt for PREP class starting 12/09/22

## 2022-12-03 ENCOUNTER — Encounter (HOSPITAL_COMMUNITY)
Admission: RE | Admit: 2022-12-03 | Discharge: 2022-12-03 | Disposition: A | Discharge status: Home / Self Care | Payer: BC Managed Care – PPO | Source: Ambulatory Visit | Attending: Pulmonary Disease | Admitting: Pulmonary Disease

## 2022-12-03 VITALS — Wt 160.9 lb

## 2022-12-03 DIAGNOSIS — R0602 Shortness of breath: Secondary | ICD-10-CM | POA: Diagnosis not present

## 2022-12-03 DIAGNOSIS — U099 Post covid-19 condition, unspecified: Secondary | ICD-10-CM

## 2022-12-04 NOTE — Progress Notes (Signed)
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  Shortness of breath  Patient's Home Medications on Admission:   Current Outpatient Medications:    albuterol (PROVENTIL) (2.5 MG/3ML) 0.083% nebulizer solution, Take 3 mLs (2.5 mg total) by nebulization every 6 (six) hours as needed for wheezing or shortness of breath., Disp: 75 mL, Rfl: 2   Albuterol-Budesonide (AIRSUPRA) 90-80 MCG/ACT AERO, Inhale 2 puffs into the lungs every 4 (four) hours as needed., Disp: 10.7 g, Rfl: 5   budesonide-formoterol (SYMBICORT) 160-4.5 MCG/ACT inhaler, Inhale 2 puffs into the lungs in the morning and at bedtime., Disp: 1 each, Rfl: 11   ibuprofen (ADVIL,MOTRIN) 800 MG tablet, Take 1 tablet (800 mg total) by mouth every 8 (eight) hours as needed for pain., Disp: 40 tablet, Rfl: 1   loratadine (CLARITIN) 10 MG tablet, Take 10 mg by mouth daily., Disp: , Rfl:    norgestimate-ethinyl estradiol (ORTHO-CYCLEN) 0.25-35 MG-MCG tablet, Take 1 tablet by mouth daily., Disp: , Rfl:    sertraline (ZOLOFT) 50 MG tablet, Take 50 mg by mouth daily., Disp: , Rfl:   Past Medical History: Past Medical History:  Diagnosis Date   Blood transfusion without reported diagnosis    pp hemorrhage 2011 twin delivery   Endometriosis    stage four   Frequent UTI    Headache(784.0)    migraine   Hx of preeclampsia, prior pregnancy, currently pregnant    Long COVID    Patient states caused lung damage   Normal pregnancy 06/01/2012   SVD (spontaneous vaginal delivery) 06/02/2012    Tobacco Use: Social History   Tobacco Use  Smoking Status Former   Years: 2   Types: Cigarettes   Passive exposure: Past  Smokeless Tobacco Never    Labs: Review Flowsheet        No data to display          Capillary Blood Glucose: No results found for:  "GLUCAP"   Pulmonary Assessment Scores:  Pulmonary Assessment Scores     Row Name 08/30/22 1109 11/21/22 1607       ADL UCSD   ADL Phase Entry --    SOB Score total 86 60      CAT Score   CAT Score 31 29            UCSD: Self-administered rating of dyspnea associated with activities of daily living (ADLs) 6-point scale (0 = "not at all" to 5 = "maximal or unable to do because of breathlessness")  Scoring Scores range from 0 to 120.  Minimally important difference is 5 units  CAT: CAT can identify the health impairment of COPD patients and is better correlated with disease progression.  CAT has a scoring range of zero to 40. The CAT score is classified into four groups of low (less than 10), medium (10 - 20), high (21-30) and very high (31-40) based on the impact level of disease on health status. A CAT score over 10 suggests significant symptoms.  A worsening CAT score could be explained by an exacerbation, poor medication adherence, poor inhaler technique, or progression of COPD or comorbid conditions.  CAT MCID is 2 points  mMRC: mMRC (Modified Medical Research Council) Dyspnea Scale is used to assess the degree of baseline functional disability in patients of respiratory disease due to dyspnea. No minimal important difference is established. A  decrease in score of 1 point or greater is considered a positive change.   Pulmonary Function Assessment:   Exercise Target Goals: Exercise Program Goal: Individual exercise prescription set using results from initial 6 min walk test and THRR while considering  patient's activity barriers and safety.   Exercise Prescription Goal: Initial exercise prescription builds to 30-45 minutes a day of aerobic activity, 2-3 days per week.  Home exercise guidelines will be given to patient during program as part of exercise prescription that the participant will acknowledge.  Activity Barriers & Risk Stratification:  Activity Barriers &  Cardiac Risk Stratification - 08/30/22 1050       Activity Barriers & Cardiac Risk Stratification   Activity Barriers Shortness of Breath;Deconditioning;Muscular Weakness;Joint Problems             6 Minute Walk:  6 Minute Walk     Row Name 08/30/22 1138 11/28/22 1548       6 Minute Walk   Phase Initial Discharge    Distance 1080 feet 1800 feet    Distance % Change -- 50 %    Distance Feet Change -- 600 ft    Walk Time 6 minutes 6 minutes    # of Rest Breaks 0 0    MPH 2.05 3.41    METS 3.85 5.23    RPE 12 11    Perceived Dyspnea  1.5 2    VO2 Peak 13.48 18.3    Symptoms No No    Resting HR 102 bpm 98 bpm    Resting BP 98/72 98/58    Resting Oxygen Saturation  97 % 97 %    Exercise Oxygen Saturation  during 6 min walk 95 % 95 %    Max Ex. HR 105 bpm 110 bpm    Max Ex. BP 102/66 112/58    2 Minute Post BP 100/62 96/64      Interval HR   1 Minute HR 102 110    2 Minute HR 101 108    3 Minute HR 100 107    4 Minute HR 102 103    5 Minute HR 102 103    6 Minute HR 105 103    2 Minute Post HR 88 103    Interval Heart Rate? Yes Yes      Interval Oxygen   Interval Oxygen? Yes Yes    Baseline Oxygen Saturation % 97 % 97 %    1 Minute Oxygen Saturation % 96 % 96 %    1 Minute Liters of Oxygen 0 L 0 L    2 Minute Oxygen Saturation % 95 % 96 %    2 Minute Liters of Oxygen 0 L 0 L    3 Minute Oxygen Saturation % 95 % 96 %    3 Minute Liters of Oxygen 0 L 0 L    4 Minute Oxygen Saturation % 95 % 96 %    4 Minute Liters of Oxygen 0 L 0 L    5 Minute Oxygen Saturation % 96 % 95 %    5 Minute Liters of Oxygen 0 L 0 L    6 Minute Oxygen Saturation % 95 % 96 %    6 Minute Liters of Oxygen 0 L 0 L    2 Minute Post Oxygen Saturation % 96 % 97 %    2 Minute Post Liters of Oxygen 0 L 0 L  Oxygen Initial Assessment:  Oxygen Initial Assessment - 08/30/22 1053       Home Oxygen   Home Oxygen Device None    Sleep Oxygen Prescription None    Home  Exercise Oxygen Prescription None    Home Resting Oxygen Prescription None      Initial 6 min Walk   Oxygen Used None      Program Oxygen Prescription   Program Oxygen Prescription None      Intervention   Short Term Goals To learn and understand importance of maintaining oxygen saturations>88%;To learn and demonstrate proper use of respiratory medications;To learn and understand importance of monitoring SPO2 with pulse oximeter and demonstrate accurate use of the pulse oximeter.;To learn and demonstrate proper pursed lip breathing techniques or other breathing techniques.     Long  Term Goals Maintenance of O2 saturations>88%;Compliance with respiratory medication;Verbalizes importance of monitoring SPO2 with pulse oximeter and return demonstration;Exhibits proper breathing techniques, such as pursed lip breathing or other method taught during program session;Demonstrates proper use of MDI's             Oxygen Re-Evaluation:  Oxygen Re-Evaluation     Row Name 08/30/22 1353 09/24/22 0906 10/31/22 0911 11/25/22 1125       Program Oxygen Prescription   Program Oxygen Prescription None None None None      Home Oxygen   Home Oxygen Device None None None None    Sleep Oxygen Prescription None None None None    Home Exercise Oxygen Prescription None None None None    Home Resting Oxygen Prescription None None None None      Goals/Expected Outcomes   Short Term Goals To learn and understand importance of maintaining oxygen saturations>88%;To learn and demonstrate proper use of respiratory medications;To learn and understand importance of monitoring SPO2 with pulse oximeter and demonstrate accurate use of the pulse oximeter.;To learn and demonstrate proper pursed lip breathing techniques or other breathing techniques.  To learn and understand importance of maintaining oxygen saturations>88%;To learn and demonstrate proper use of respiratory medications;To learn and understand importance of  monitoring SPO2 with pulse oximeter and demonstrate accurate use of the pulse oximeter.;To learn and demonstrate proper pursed lip breathing techniques or other breathing techniques.  To learn and understand importance of maintaining oxygen saturations>88%;To learn and demonstrate proper use of respiratory medications;To learn and understand importance of monitoring SPO2 with pulse oximeter and demonstrate accurate use of the pulse oximeter.;To learn and demonstrate proper pursed lip breathing techniques or other breathing techniques.  To learn and understand importance of maintaining oxygen saturations>88%;To learn and demonstrate proper use of respiratory medications;To learn and understand importance of monitoring SPO2 with pulse oximeter and demonstrate accurate use of the pulse oximeter.;To learn and demonstrate proper pursed lip breathing techniques or other breathing techniques.     Long  Term Goals Maintenance of O2 saturations>88%;Compliance with respiratory medication;Verbalizes importance of monitoring SPO2 with pulse oximeter and return demonstration;Exhibits proper breathing techniques, such as pursed lip breathing or other method taught during program session;Demonstrates proper use of MDI's Maintenance of O2 saturations>88%;Compliance with respiratory medication;Verbalizes importance of monitoring SPO2 with pulse oximeter and return demonstration;Exhibits proper breathing techniques, such as pursed lip breathing or other method taught during program session;Demonstrates proper use of MDI's Maintenance of O2 saturations>88%;Compliance with respiratory medication;Verbalizes importance of monitoring SPO2 with pulse oximeter and return demonstration;Exhibits proper breathing techniques, such as pursed lip breathing or other method taught during program session;Demonstrates proper use of MDI's Maintenance of O2 saturations>88%;Compliance with  respiratory medication;Verbalizes importance of monitoring SPO2  with pulse oximeter and return demonstration;Exhibits proper breathing techniques, such as pursed lip breathing or other method taught during program session;Demonstrates proper use of MDI's    Goals/Expected Outcomes Compliance and understanding of oxygen saturation monitoring and breathing techniques to decrease shortness of breath. Compliance and understanding of oxygen saturation monitoring and breathing techniques to decrease shortness of breath. Compliance and understanding of oxygen saturation monitoring and breathing techniques to decrease shortness of breath. Compliance and understanding of oxygen saturation monitoring and breathing techniques to decrease shortness of breath.             Oxygen Discharge (Final Oxygen Re-Evaluation):  Oxygen Re-Evaluation - 11/25/22 1125       Program Oxygen Prescription   Program Oxygen Prescription None      Home Oxygen   Home Oxygen Device None    Sleep Oxygen Prescription None    Home Exercise Oxygen Prescription None    Home Resting Oxygen Prescription None      Goals/Expected Outcomes   Short Term Goals To learn and understand importance of maintaining oxygen saturations>88%;To learn and demonstrate proper use of respiratory medications;To learn and understand importance of monitoring SPO2 with pulse oximeter and demonstrate accurate use of the pulse oximeter.;To learn and demonstrate proper pursed lip breathing techniques or other breathing techniques.     Long  Term Goals Maintenance of O2 saturations>88%;Compliance with respiratory medication;Verbalizes importance of monitoring SPO2 with pulse oximeter and return demonstration;Exhibits proper breathing techniques, such as pursed lip breathing or other method taught during program session;Demonstrates proper use of MDI's    Goals/Expected Outcomes Compliance and understanding of oxygen saturation monitoring and breathing techniques to decrease shortness of breath.             Initial  Exercise Prescription:   Perform Capillary Blood Glucose checks as needed.  Exercise Prescription Changes:   Exercise Prescription Changes     Row Name 09/10/22 1200 09/24/22 1500 10/03/22 1600 10/08/22 1500 10/22/22 1500     Response to Exercise   Blood Pressure (Admit) 94/62 108/69 -- 98/60 90/60   Blood Pressure (Exercise) 124/72 96/76 -- 122/64 124/66   Blood Pressure (Exit) 87/60 94/60 -- 92/58 100/64   Heart Rate (Admit) 105 bpm 97 bpm -- 56 bpm 78 bpm   Heart Rate (Exercise) 96 bpm 130 bpm -- 136 bpm 132 bpm   Heart Rate (Exit) 88 bpm 89 bpm -- 92 bpm 95 bpm   Oxygen Saturation (Admit) 95 % 96 % -- 97 % 95 %   Oxygen Saturation (Exercise) 96 % 95 % -- 96 % 96 %   Oxygen Saturation (Exit) 98 % 97 % -- 96 % 96 %   Rating of Perceived Exertion (Exercise) 12 12 -- 13 11   Perceived Dyspnea (Exercise) 3 3 -- 3 2.5   Duration Progress to 30 minutes of  aerobic without signs/symptoms of physical distress Continue with 30 min of aerobic exercise without signs/symptoms of physical distress. -- Continue with 30 min of aerobic exercise without signs/symptoms of physical distress. Continue with 30 min of aerobic exercise without signs/symptoms of physical distress.   Intensity THRR unchanged THRR unchanged -- THRR unchanged THRR unchanged     Progression   Progression Continue to progress workloads to maintain intensity without signs/symptoms of physical distress. Continue to progress workloads to maintain intensity without signs/symptoms of physical distress. -- Continue to progress workloads to maintain intensity without signs/symptoms of physical distress. --  Resistance Training   Weight --  blue bands blue bands -- blue bands blue bands   Reps 10-15 10-15 -- 10-15 10-15   Time 10 Minutes 10 Minutes -- 10 Minutes 10 Minutes     Interval Training   Interval Training -- Yes -- Yes Yes   Equipment -- Treadmill -- Treadmill Treadmill   Comments -- @2mph /0.5, @ 2.29mph/3%  -- 1 min @ 3.28mph and 3.5% and 2 min @ 3.5 mph and 1.0% 1 min @ 3.75mph and 3.5% and 2 min @ 3.5 mph and 1.0%     Treadmill   MPH 2 2 -- 3.5 3.5   Grade 0.5 3 -- 3.5 3.5   Minutes 15 15 -- 15 15   METs -- 3.36 -- 5.37 5.37     Recumbant Elliptical   Level 2 2 -- -- --   Minutes 15 15 -- -- --   METs 4.1 5.6 -- -- --     Rower   Level -- -- -- 2 3   Watts -- -- -- 27 34   Minutes -- -- -- 15 15     Home Exercise Plan   Plans to continue exercise at -- -- Home (comment) -- --   Frequency -- -- Add 1 additional day to program exercise sessions. -- --   Initial Home Exercises Provided -- -- 10/03/22 -- --    Row Name 11/05/22 1200 11/19/22 1200 12/03/22 0900         Response to Exercise   Blood Pressure (Admit) 98/54 96/62 100/58     Blood Pressure (Exercise) 100/62 110/60 112/68     Blood Pressure (Exit) 90/52 90/64 104/58     Heart Rate (Admit) 81 bpm 100 bpm 100 bpm     Heart Rate (Exercise) 137 bpm 157 bpm 133 bpm     Heart Rate (Exit) 96 bpm 101 bpm 109 bpm     Oxygen Saturation (Admit) 97 % 97 % 96 %     Oxygen Saturation (Exercise) 96 % 95 % 96 %     Oxygen Saturation (Exit) 96 % 96 % 96 %     Rating of Perceived Exertion (Exercise) 15 13 13      Perceived Dyspnea (Exercise) 3 3 2      Duration Continue with 30 min of aerobic exercise without signs/symptoms of physical distress. Continue with 30 min of aerobic exercise without signs/symptoms of physical distress. Continue with 30 min of aerobic exercise without signs/symptoms of physical distress.     Intensity THRR unchanged THRR unchanged THRR unchanged       Progression   Progression Continue to progress workloads to maintain intensity without signs/symptoms of physical distress. Continue to progress workloads to maintain intensity without signs/symptoms of physical distress. Continue to progress workloads to maintain intensity without signs/symptoms of physical distress.       Resistance Training   Weight black bands  black bands black bands     Reps 10-15 10-15 10-15     Time 10 Minutes 10 Minutes 10 Minutes       Interval Training   Interval Training Yes Yes Yes     Equipment Treadmill Treadmill Treadmill     Comments 45 sec@5 .0 1min@ 3.0 45 sec @ 5.0/56min @ 3.1 45sec@5 .2/67min@3 .0       Treadmill   MPH 5 5 5.2     Grade 0 0 0     Minutes 15 15 15      METs 8.66 7.1  5.5       Rower   Level 3 4 5      Watts 50 45 47     Minutes 15 15 15               Exercise Comments:   Exercise Comments     Row Name 09/05/22 1221 10/03/22 1555         Exercise Comments Pt completed first day of exercise. Cheryl Wright exercised for 15 min on the recumbent elliptical and treadmill. She averaged 2.8 METs at level 1 on the recumbent elliptical and 2.53 MET on the treadmill. Cheryl Wright performed the warmup and cooldown standing without limitations. Completed home exercise plan. Cheryl Wright is currently exercising at home. She is walking outside or on the treadmill 1-2 non-rehab days/wk for 30 min/day. Cheryl Wright stated that she has increased her time from 10 min to 30 min. She is trying to progress to 3 non-rehab days of exercise. Cheryl encouraged her to increase to 3. She is also trying to progress herself by walking up hills. Cheryl am satisfied with Cheryl Wright's current home exercise and her goals to increase her home exercise. Cheryl Wright is very motivated to exercise and improve her functional capacity.               Exercise Goals and Review:   Exercise Goals     Row Name 08/30/22 1052 08/30/22 1352 09/24/22 0906 10/31/22 0857 11/25/22 1119     Exercise Goals   Increase Physical Activity Yes Yes Yes Yes Yes   Intervention Provide advice, education, support and counseling about physical activity/exercise needs.;Develop an individualized exercise prescription for aerobic and resistive training based on initial evaluation findings, risk stratification, comorbidities and participant's personal goals. Provide advice, education, support  and counseling about physical activity/exercise needs.;Develop an individualized exercise prescription for aerobic and resistive training based on initial evaluation findings, risk stratification, comorbidities and participant's personal goals. Provide advice, education, support and counseling about physical activity/exercise needs.;Develop an individualized exercise prescription for aerobic and resistive training based on initial evaluation findings, risk stratification, comorbidities and participant's personal goals. Provide advice, education, support and counseling about physical activity/exercise needs.;Develop an individualized exercise prescription for aerobic and resistive training based on initial evaluation findings, risk stratification, comorbidities and participant's personal goals. Provide advice, education, support and counseling about physical activity/exercise needs.;Develop an individualized exercise prescription for aerobic and resistive training based on initial evaluation findings, risk stratification, comorbidities and participant's personal goals.   Expected Outcomes Short Term: Attend rehab on a regular basis to increase amount of physical activity.;Long Term: Exercising regularly at least 3-5 days a week.;Long Term: Add in home exercise to make exercise part of routine and to increase amount of physical activity. Short Term: Attend rehab on a regular basis to increase amount of physical activity.;Long Term: Exercising regularly at least 3-5 days a week.;Long Term: Add in home exercise to make exercise part of routine and to increase amount of physical activity. Short Term: Attend rehab on a regular basis to increase amount of physical activity.;Long Term: Exercising regularly at least 3-5 days a week.;Long Term: Add in home exercise to make exercise part of routine and to increase amount of physical activity. Short Term: Attend rehab on a regular basis to increase amount of physical  activity.;Long Term: Exercising regularly at least 3-5 days a week.;Long Term: Add in home exercise to make exercise part of routine and to increase amount of physical activity. Short Term: Attend rehab on a regular basis to increase amount of  physical activity.;Long Term: Exercising regularly at least 3-5 days a week.;Long Term: Add in home exercise to make exercise part of routine and to increase amount of physical activity.   Increase Strength and Stamina Yes Yes Yes Yes Yes   Intervention Provide advice, education, support and counseling about physical activity/exercise needs.;Develop an individualized exercise prescription for aerobic and resistive training based on initial evaluation findings, risk stratification, comorbidities and participant's personal goals. Provide advice, education, support and counseling about physical activity/exercise needs.;Develop an individualized exercise prescription for aerobic and resistive training based on initial evaluation findings, risk stratification, comorbidities and participant's personal goals. Provide advice, education, support and counseling about physical activity/exercise needs.;Develop an individualized exercise prescription for aerobic and resistive training based on initial evaluation findings, risk stratification, comorbidities and participant's personal goals. Provide advice, education, support and counseling about physical activity/exercise needs.;Develop an individualized exercise prescription for aerobic and resistive training based on initial evaluation findings, risk stratification, comorbidities and participant's personal goals. Provide advice, education, support and counseling about physical activity/exercise needs.;Develop an individualized exercise prescription for aerobic and resistive training based on initial evaluation findings, risk stratification, comorbidities and participant's personal goals.   Expected Outcomes Short Term: Increase  workloads from initial exercise prescription for resistance, speed, and METs.;Short Term: Perform resistance training exercises routinely during rehab and add in resistance training at home;Long Term: Improve cardiorespiratory fitness, muscular endurance and strength as measured by increased METs and functional capacity ( ) Short Term: Increase workloads from initial exercise prescription for resistance, speed, and METs.;Short Term: Perform resistance training exercises routinely during rehab and add in resistance training at home;Long Term: Improve cardiorespiratory fitness, muscular endurance and strength as measured by increased METs and functional capacity ( ) Short Term: Increase workloads from initial exercise prescription for resistance, speed, and METs.;Short Term: Perform resistance training exercises routinely during rehab and add in resistance training at home;Long Term: Improve cardiorespiratory fitness, muscular endurance and strength as measured by increased METs and functional capacity ( ) Short Term: Increase workloads from initial exercise prescription for resistance, speed, and METs.;Short Term: Perform resistance training exercises routinely during rehab and add in resistance training at home;Long Term: Improve cardiorespiratory fitness, muscular endurance and strength as measured by increased METs and functional capacity ( ) Short Term: Increase workloads from initial exercise prescription for resistance, speed, and METs.;Short Term: Perform resistance training exercises routinely during rehab and add in resistance training at home;Long Term: Improve cardiorespiratory fitness, muscular endurance and strength as measured by increased METs and functional capacity ( )   Able to understand and use rate of perceived exertion (RPE) scale Yes Yes Yes Yes Yes   Intervention Provide education and explanation on how to use RPE scale Provide education and explanation on how to use RPE scale  Provide education and explanation on how to use RPE scale Provide education and explanation on how to use RPE scale Provide education and explanation on how to use RPE scale   Expected Outcomes Short Term: Able to use RPE daily in rehab to express subjective intensity level;Long Term:  Able to use RPE to guide intensity level when exercising independently Short Term: Able to use RPE daily in rehab to express subjective intensity level;Long Term:  Able to use RPE to guide intensity level when exercising independently Short Term: Able to use RPE daily in rehab to express subjective intensity level;Long Term:  Able to use RPE to guide intensity level when exercising independently Short Term: Able to use RPE daily in rehab to express subjective intensity level;Long Term:  Able to use RPE to guide intensity level when exercising independently Short Term: Able to use RPE daily in rehab to express subjective intensity level;Long Term:  Able to use RPE to guide intensity level when exercising independently   Able to understand and use Dyspnea scale Yes Yes Yes Yes Yes   Intervention Provide education and explanation on how to use Dyspnea scale Provide education and explanation on how to use Dyspnea scale Provide education and explanation on how to use Dyspnea scale Provide education and explanation on how to use Dyspnea scale Provide education and explanation on how to use Dyspnea scale   Expected Outcomes Short Term: Able to use Dyspnea scale daily in rehab to express subjective sense of shortness of breath during exertion;Long Term: Able to use Dyspnea scale to guide intensity level when exercising independently Short Term: Able to use Dyspnea scale daily in rehab to express subjective sense of shortness of breath during exertion;Long Term: Able to use Dyspnea scale to guide intensity level when exercising independently Short Term: Able to use Dyspnea scale daily in rehab to express subjective sense of shortness of  breath during exertion;Long Term: Able to use Dyspnea scale to guide intensity level when exercising independently Short Term: Able to use Dyspnea scale daily in rehab to express subjective sense of shortness of breath during exertion;Long Term: Able to use Dyspnea scale to guide intensity level when exercising independently Short Term: Able to use Dyspnea scale daily in rehab to express subjective sense of shortness of breath during exertion;Long Term: Able to use Dyspnea scale to guide intensity level when exercising independently   Knowledge and understanding of Target Heart Rate Range (THRR) Yes Yes Yes Yes Yes   Intervention Provide education and explanation of THRR including how the numbers were predicted and where they are located for reference Provide education and explanation of THRR including how the numbers were predicted and where they are located for reference Provide education and explanation of THRR including how the numbers were predicted and where they are located for reference Provide education and explanation of THRR including how the numbers were predicted and where they are located for reference Provide education and explanation of THRR including how the numbers were predicted and where they are located for reference   Expected Outcomes Short Term: Able to state/look up THRR;Long Term: Able to use THRR to govern intensity when exercising independently;Short Term: Able to use daily as guideline for intensity in rehab Short Term: Able to state/look up THRR;Long Term: Able to use THRR to govern intensity when exercising independently;Short Term: Able to use daily as guideline for intensity in rehab Short Term: Able to state/look up THRR;Long Term: Able to use THRR to govern intensity when exercising independently;Short Term: Able to use daily as guideline for intensity in rehab Short Term: Able to state/look up THRR;Long Term: Able to use THRR to govern intensity when exercising  independently;Short Term: Able to use daily as guideline for intensity in rehab Short Term: Able to state/look up THRR;Long Term: Able to use THRR to govern intensity when exercising independently;Short Term: Able to use daily as guideline for intensity in rehab   Able to check pulse independently -- -- -- -- --   Intervention -- -- -- -- --   Expected Outcomes -- -- -- -- --   Understanding of Exercise Prescription Yes Yes Yes Yes Yes   Intervention Provide education, explanation, and written materials on patient's individual exercise prescription Provide education, explanation, and written materials on  patient's individual exercise prescription Provide education, explanation, and written materials on patient's individual exercise prescription Provide education, explanation, and written materials on patient's individual exercise prescription Provide education, explanation, and written materials on patient's individual exercise prescription   Expected Outcomes Short Term: Able to explain program exercise prescription;Long Term: Able to explain home exercise prescription to exercise independently Short Term: Able to explain program exercise prescription;Long Term: Able to explain home exercise prescription to exercise independently Short Term: Able to explain program exercise prescription;Long Term: Able to explain home exercise prescription to exercise independently Short Term: Able to explain program exercise prescription;Long Term: Able to explain home exercise prescription to exercise independently Short Term: Able to explain program exercise prescription;Long Term: Able to explain home exercise prescription to exercise independently            Exercise Goals Re-Evaluation :  Exercise Goals Re-Evaluation     Row Name 08/30/22 1352 09/26/22 0651 10/31/22 0857 11/25/22 1119       Exercise Goal Re-Evaluation   Exercise Goals Review Increase Physical Activity;Able to understand and use Dyspnea  scale;Understanding of Exercise Prescription;Increase Strength and Stamina;Knowledge and understanding of Target Heart Rate Range (THRR);Able to understand and use rate of perceived exertion (RPE) scale;Able to check pulse independently Increase Physical Activity;Able to understand and use Dyspnea scale;Understanding of Exercise Prescription;Increase Strength and Stamina;Knowledge and understanding of Target Heart Rate Range (THRR);Able to understand and use rate of perceived exertion (RPE) scale;Able to check pulse independently Increase Physical Activity;Able to understand and use Dyspnea scale;Understanding of Exercise Prescription;Increase Strength and Stamina;Knowledge and understanding of Target Heart Rate Range (THRR);Able to understand and use rate of perceived exertion (RPE) scale;Able to check pulse independently Increase Physical Activity;Able to understand and use Dyspnea scale;Understanding of Exercise Prescription;Increase Strength and Stamina;Knowledge and understanding of Target Heart Rate Range (THRR);Able to understand and use rate of perceived exertion (RPE) scale;Able to check pulse independently    Comments Patient is scheduled to begin exercise next week. Will continue to monitor and progress as able. Smriti has completed 6 exercise sessions. She exercises for 15 min on the recumbent elliptical and treadmill. She averages 5.6 METs at level 2 on the recumbent elliptical and 3.48 METs on the treadmill. Cheryl Wright performs the warmup and cooldown standing without limitations. Cheryl Wright has increased her workload for both exercise modes. She tolereates progressions well. METs have also increased for both exercise modes. Cheryl Wright has started doing interval training on the treadmill. Cheryl Wright does 1 min of 2.1 mph with 0.5% incline and 2.1 mph with 3.0% incline. Both intervals are somewhat easy for her. Will adjust intervals to increase intensity slightly. Cheryl Wright is very motivated to exercise and always has a  postive attitude. Will continue to monitor and progress as able. Cheryl Wright has completed 16 exercise sessions. She exercises for 15 min on the rower and treadmill. She averages 39 watts at level 3 on the rower and 8.66 METs on the treadmill. Cheryl Wright jogs for 30 sec at 5 mph and walks for 2 min at 3 mph. Cheryl Wright performs the warmup and cooldown standing without limitations. Cheryl Wright has made several progressions since starting the program. Cheryl Wright has switched from the recumbent elliptical and starting her jogging intervals. She tolerates both progressions very well. Cheryl Wright is very motivated to exericise as she is willing to increase her workload every other week. Cheryl Wright. She does exercise at home as she has a treadmill to walk on and walks outside with her family. Cheryl am  confident in Katelen carrying out an exercise regimen at home. Will continue to monitor and progress as able Cheryl Wright has completed 23 exercise sessions. She exercises for 15 min on the rower and treadmill. She averages 45 watts at level 4 on the rower and 7.1 METs on the treadmill. Janelle jogs for 45 sec at 5 mph and walks for 1:15 min at 3 mph. Kyriana performs the warmup and cooldown standing without limitations. Delaina has progressed her treadmill interval. Her jogging interval has increased to 45 sec. She tolerates progressions very well. Philomina is very motivated to exercise as she has extended her time in PR. She reports exercising consistently at home. Nurah recently requested ideas for walking interval at home. Cheryl Wright is progression well, Cheryl am unsure how much more she will progress. She is starting to reach a plateau in her functional capacity. Cheryl Wright has met most, if not all of her exercise goals. Will continue to monitor and progress as able.    Expected Outcomes Through exercise at rehab and home, the patient will decrease shortness of breath with daily activities and feel confident in carrying out an  exercise regimen at home. Through exercise at rehab and home, the patient will decrease shortness of breath with daily activities and feel confident in carrying out an exercise regimen at home. Through exercise at rehab and home, the patient will decrease shortness of breath with daily activities and feel confident in carrying out an exercise regimen at home. Through exercise at rehab and home, the patient will decrease shortness of breath with daily activities and feel confident in carrying out an exercise regimen at home.             Discharge Exercise Prescription (Final Exercise Prescription Changes):  Exercise Prescription Changes - 12/03/22 0900       Response to Exercise   Blood Pressure (Admit) 100/58    Blood Pressure (Exercise) 112/68    Blood Pressure (Exit) 104/58    Heart Rate (Admit) 100 bpm    Heart Rate (Exercise) 133 bpm    Heart Rate (Exit) 109 bpm    Oxygen Saturation (Admit) 96 %    Oxygen Saturation (Exercise) 96 %    Oxygen Saturation (Exit) 96 %    Rating of Perceived Exertion (Exercise) 13    Perceived Dyspnea (Exercise) 2    Duration Continue with 30 min of aerobic exercise without signs/symptoms of physical distress.    Intensity THRR unchanged      Progression   Progression Continue to progress workloads to maintain intensity without signs/symptoms of physical distress.      Resistance Training   Weight black bands    Reps 10-15    Time 10 Minutes      Interval Training   Interval Training Yes    Equipment Treadmill    Comments 45sec@5 .2/1min@3 .0      Treadmill   MPH 5.2    Grade 0    Minutes 15    METs 5.5      Rower   Level 5    Watts 47    Minutes 15             Nutrition:  Target Goals: Understanding of nutrition guidelines, daily intake of sodium 1500mg , cholesterol 200mg , calories 30% from fat and 7% or less from saturated fats, daily to have 5 or more servings of fruits and vegetables.  Biometrics:  Pre Biometrics -  08/30/22 1035       Pre Biometrics  Grip Strength 18 kg              Nutrition Therapy Plan and Nutrition Goals:  Nutrition Therapy & Goals - 11/28/22 1049       Nutrition Therapy   Diet General Healthful Diet      Personal Nutrition Goals   Nutrition Goal Patient to improve diet quality by using the plate method as a guide for meal planning to include lean protein/plant protein, fruits, vegetables, whole grains, nonfat dairy as part of a well-balanced diet.    Comments Goals in action. She is trying to eat 3-4 smaller meals daily as she describes increased shortness of breath with larger meals. She is trying to increase protein to aid with lean muscle mass; she has implemented a protein shake daily ([employer]). Have reviewd the Mediterranean diet, reducing refined carbohydrates/simple sugars, regular intake of omega 3 rich foods, and high fiber intake. She continues to monitor blood pressues and continues follow-up with rheumatology. She has maintained her weight since starting with our program. Michaelene will continue to benefit from participation in pulmonary rehab for nutrition exercise and lfiestyle modification.      Intervention Plan   Intervention Prescribe, educate and counsel regarding individualized specific dietary modifications aiming towards targeted core components such as weight, hypertension, lipid management, diabetes, heart failure and other comorbidities.;Nutrition handout(s) given to patient.    Expected Outcomes Short Term Goal: Understand basic principles of dietary content, such as calories, fat, sodium, cholesterol and nutrients.;Long Term Goal: Adherence to prescribed nutrition plan.             Nutrition Assessments:  Nutrition Assessments - 11/21/22 1412       Rate Your Plate Scores   Post Score 72            MEDIFICTS Score Key: ?70 Need to make dietary changes  40-70 Heart Healthy Diet ? 40 Therapeutic Level Cholesterol Diet  Flowsheet  Row PULMONARY REHAB CHRONIC OBSTRUCTIVE PULMONARY DISEASE from 11/21/2022 in [facility]  Picture Your Plate Total Score on Discharge 72      Picture Your Plate Scores: <11 Unhealthy dietary pattern with much room for improvement. 41-50 Dietary pattern unlikely to meet recommendations for good health and room for improvement. 51-60 More healthful dietary pattern, with some room for improvement.  >60 Healthy dietary pattern, although there may be some specific behaviors that could be improved.    Nutrition Goals Re-Evaluation:  Nutrition Goals Re-Evaluation     Row Name 09/05/22 1220 10/01/22 1448 10/29/22 1102 11/28/22 1049       Goals   Current Weight 159 lb 13.3 oz (72.5 kg) 160 lb 7.9 oz (72.8 kg) 161 lb 6 oz (73.2 kg) 160 lb 4.4 oz (72.7 kg)    Comment lipids WNL labs WNL No new labs at this time; most recent labs WNL labs WNL    Expected Outcome Cheryl Wright reports improved appetite over the last two weeks. She is trying to eat 3-4 smaller meals daily as she describes increased shortness of breath with larger meals. She is trying to increase protein to aid with lean muscle mass. Will continue to discuss protein needs/intake and anti-inflammatory foods/mediteranean diet. Cheryl Wright will continue to benefit from participation in pulmonary rehab for nutrition exercise and lfiestyle modification. Goals in action. She is trying to eat 3-4 smaller meals daily as she describes increased shortness of breath with larger meals. She is trying to increase protein to aid  with lean muscle mass; she has implemented a protein shake daily ([employer]). Have reviewd the Mediterranean diet to aid with introducing/increasing anti-inflammatory foods. Carlena will follow-up with cardiology in May regarding low blood pressures; she reports adequate fluid intake. Cheryl Wright will continue to benefit from participation in pulmonary rehab for nutrition exercise and  lfiestyle modification. Goals in action. She is trying to eat 3-4 smaller meals daily as she describes increased shortness of breath with larger meals. She is trying to increase protein to aid with lean muscle mass; she has implemented a protein shake daily ([employer]). Have reviewd the Mediterranean diet to aid with introducing/increasing anti-inflammatory foods. Cheryl Wright will follow-up with cardiology on 5/8 regarding low blood pressures; she reports adequate hydration. She has maintained her weight since starting with our program. Jayanna will continue to benefit from participation in pulmonary rehab for nutrition exercise and lfiestyle modification. Goals in action. She is trying to eat 3-4 smaller meals daily as she describes increased shortness of breath with larger meals. She is trying to increase protein to aid with lean muscle mass; she has implemented a protein shake daily ([employer]). Have reviewd the Mediterranean diet, reducing refined carbohydrates/simple sugars, regular intake of omega 3 rich foods, and high fiber intake. She continues to monitor blood pressues and continues follow-up with rheumatology. She has maintained her weight since starting with our program. Shatasia will continue to benefit from participation in pulmonary rehab for nutrition exercise and lfiestyle modification.             Nutrition Goals Discharge (Final Nutrition Goals Re-Evaluation):  Nutrition Goals Re-Evaluation - 11/28/22 1049       Goals   Current Weight 160 lb 4.4 oz (72.7 kg)    Comment labs WNL    Expected Outcome Goals in action. She is trying to eat 3-4 smaller meals daily as she describes increased shortness of breath with larger meals. She is trying to increase protein to aid with lean muscle mass; she has implemented a protein shake daily ([employer]). Have reviewd the Mediterranean diet, reducing refined carbohydrates/simple sugars, regular intake of omega 3 rich foods, and high fiber intake. She  continues to monitor blood pressues and continues follow-up with rheumatology. She has maintained her weight since starting with our program. Lucylle will continue to benefit from participation in pulmonary rehab for nutrition exercise and lfiestyle modification.             Psychosocial: Target Goals: Acknowledge presence or absence of significant depression and/or stress, maximize coping skills, provide positive support system. Participant is able to verbalize types and ability to use techniques and skills needed for reducing stress and depression.  Initial Review & Psychosocial Screening:  Initial Psych Review & Screening - 08/30/22 1047       Initial Review   Current issues with Current Anxiety/Panic;Current Psychotropic Meds      Family Dynamics   Good Support System? Yes    Comments anxiety managed with meds      Barriers   Psychosocial barriers to participate in program There are no identifiable barriers or psychosocial needs.      Screening Interventions   Interventions Encouraged to exercise             Quality of Life Scores:  Scores of 19 and below usually indicate a poorer quality of life in these areas.  A difference of  2-3 points is a clinically meaningful difference.  A difference of 2-3 points in the total score of  the Quality of Life Index has been associated with significant improvement in overall quality of life, self-image, physical symptoms, and general health in studies assessing change in quality of life.  PHQ-9: Review Flowsheet       11/21/2022 08/30/2022  Depression screen PHQ 2/9  Decreased Interest 0 0  Down, Depressed, Hopeless 0 0  PHQ - 2 Score 0 0  Altered sleeping 0 0  Tired, decreased energy 0 0  Change in appetite 1 0  Feeling bad or failure about yourself  0 0  Trouble concentrating 1 0  Moving slowly or fidgety/restless 0 0  Suicidal thoughts 0 0  PHQ-9 Score 2 0  Difficult doing work/chores Very difficult Somewhat difficult    Interpretation of Total Score  Total Score Depression Severity:  1-4 = Minimal depression, 5-9 = Mild depression, 10-14 = Moderate depression, 15-19 = Moderately severe depression, 20-27 = Severe depression   Psychosocial Evaluation and Intervention:  Psychosocial Evaluation - 08/30/22 1205       Psychosocial Evaluation & Interventions   Interventions Encouraged to exercise with the program and follow exercise prescription    Comments Pt denies any psychosocial or barriers concerns    Expected Outcomes Pt will partcipate in PR without any psychosocial barriers or concerns    Continue Psychosocial Services  No Follow up required             Psychosocial Re-Evaluation:  Psychosocial Re-Evaluation     Row Name 08/30/22 1401 09/18/22 1527 10/28/22 1331         Psychosocial Re-Evaluation   Current issues with Current Anxiety/Panic;Current Psychotropic Meds Current Anxiety/Panic;Current Psychotropic Meds None Identified     Comments Pt has not started PR yet Pt denies any psychosocial barriers or concerns at this time Pt denies any psychosocial barriers or concerns at this time     Expected Outcomes For pt to participate in PR without any psychosocial barriers or concerns For pt to participate in PR without any psychosocial barriers or concerns For pt to participate in PR without any psychosocial barriers or concerns     Interventions Encouraged to attend Pulmonary Rehabilitation for the exercise Encouraged to attend Pulmonary Rehabilitation for the exercise Encouraged to attend Pulmonary Rehabilitation for the exercise     Continue Psychosocial Services  No Follow up required No Follow up required No Follow up required              Psychosocial Discharge (Final Psychosocial Re-Evaluation):  Psychosocial Re-Evaluation - 10/28/22 1331       Psychosocial Re-Evaluation   Current issues with None Identified    Comments Pt denies any psychosocial barriers or concerns at this  time    Expected Outcomes For pt to participate in PR without any psychosocial barriers or concerns    Interventions Encouraged to attend Pulmonary Rehabilitation for the exercise    Continue Psychosocial Services  No Follow up required             Education: Education Goals: Education classes will be provided on a weekly basis, covering required topics. Participant will state understanding/return demonstration of topics presented.  Learning Barriers/Preferences:  Learning Barriers/Preferences - 08/30/22 1049       Learning Barriers/Preferences   Learning Barriers None    Learning Preferences None             Education Topics: Introduction to Pulmonary Rehab Group instruction provided by PowerPoint, verbal discussion, and written material to support subject matter. Instructor reviews what Pulmonary Rehab is,  the purpose of the program, and how patients are referred.     Know Your Numbers Group instruction that is supported by a PowerPoint presentation. Instructor discusses importance of knowing and understanding resting, exercise, and post-exercise oxygen saturation, heart rate, and blood pressure. Oxygen saturation, heart rate, blood pressure, rating of perceived exertion, and dyspnea are reviewed along with a normal range for these values.  Flowsheet Row PULMONARY REHAB CHRONIC OBSTRUCTIVE PULMONARY DISEASE from 10/03/2022 in [facility]  Date 10/03/22  Educator EP  Instruction Review Code 1- Verbalizes Understanding       Exercise for the Pulmonary Patient Group instruction that is supported by a PowerPoint presentation. Instructor discusses benefits of exercise, core components of exercise, frequency, duration, and intensity of an exercise routine, importance of utilizing pulse oximetry during exercise, safety while exercising, and options of places to exercise outside of rehab.  Flowsheet Row PULMONARY REHAB  CHRONIC OBSTRUCTIVE PULMONARY DISEASE from 09/26/2022 in [facility]  Date 09/26/22  Educator EP  Instruction Review Code 1- Verbalizes Understanding          MET Level  Group instruction provided by PowerPoint, verbal discussion, and written material to support subject matter. Instructor reviews what METs are and how to increase METs.  Flowsheet Row PULMONARY REHAB CHRONIC OBSTRUCTIVE PULMONARY DISEASE from 11/28/2022 in [facility]  Date 11/28/22  Educator EP  Instruction Review Code 1- Verbalizes Understanding       Pulmonary Medications Verbally interactive group education provided by instructor with focus on inhaled medications and proper administration. Flowsheet Row PULMONARY REHAB CHRONIC OBSTRUCTIVE PULMONARY DISEASE from 09/19/2022 in [facility]  Date 09/19/22  Educator RT  Instruction Review Code 1- Verbalizes Understanding       Anatomy and Physiology of the Respiratory System Group instruction provided by PowerPoint, verbal discussion, and written material to support subject matter. Instructor reviews respiratory cycle and anatomical components of the respiratory system and their functions. Instructor also reviews differences in obstructive and restrictive respiratory diseases with examples of each.    Oxygen Safety Group instruction provided by PowerPoint, verbal discussion, and written material to support subject matter. There is an overview of "What is Oxygen" and "Why do we need it".  Instructor also reviews how to create a safe environment for oxygen use, the importance of using oxygen as prescribed, and the risks of noncompliance. There is a brief discussion on traveling with oxygen and resources the patient may utilize. Flowsheet Row PULMONARY REHAB CHRONIC OBSTRUCTIVE PULMONARY DISEASE  from 10/10/2022 in [facility]  Date 10/10/22  Educator RN  Instruction Review Code 1- Verbalizes Understanding       Oxygen Use Group instruction provided by PowerPoint, verbal discussion, and written material to discuss how supplemental oxygen is prescribed and different types of oxygen supply systems. Resources for more information are provided.  Flowsheet Row PULMONARY REHAB CHRONIC OBSTRUCTIVE PULMONARY DISEASE from 10/17/2022 in [facility]  Date 10/17/22  Educator RT  Instruction Review Code 1- Verbalizes Understanding       Breathing Techniques Group instruction that is supported by demonstration and informational handouts. Instructor discusses the benefits of pursed lip and diaphragmatic breathing and detailed demonstration on how to perform both.  Flowsheet Row PULMONARY REHAB CHRONIC OBSTRUCTIVE PULMONARY DISEASE from 10/31/2022 in [facility]  Date 10/31/22  Educator RT  Instruction Review Code 1- Verbalizes Understanding        Risk Factor Reduction Group instruction that is supported by a PowerPoint presentation. Instructor discusses the definition of a risk factor, different risk factors for pulmonary disease, and how the heart and lungs work together.   MD Day A group question and answer session with a medical doctor that allows participants to ask questions that relate to their pulmonary disease state.   Nutrition for the Pulmonary Patient Group instruction provided by PowerPoint slides, verbal discussion, and written materials to support subject matter. The instructor gives an explanation and review of healthy diet recommendations, which includes a discussion on weight management, recommendations for fruit and vegetable consumption, as well as protein, fluid, caffeine, fiber, sodium, sugar,  and alcohol. Tips for eating when patients are short of breath are discussed.    Other Education Group or individual verbal, written, or video instructions that support the educational goals of the pulmonary rehab program. Flowsheet Row PULMONARY REHAB CHRONIC OBSTRUCTIVE PULMONARY DISEASE from 11/21/2022 in [facility]  Date 11/21/22  Educator RN  Instruction Review Code 1- Verbalizes Understanding        Knowledge Questionnaire Score:  Knowledge Questionnaire Score - 11/21/22 1607       Knowledge Questionnaire Score   Post Score 18/18             Core Components/Risk Factors/Patient Goals at Admission:  Personal Goals and Risk Factors at Admission - 08/30/22 1050       Core Components/Risk Factors/Patient Goals on Admission    Weight Management Weight Maintenance    Improve shortness of breath with ADL's Yes    Intervention Provide education, individualized exercise plan and daily activity instruction to help decrease symptoms of SOB with activities of daily living.    Expected Outcomes Short Term: Improve cardiorespiratory fitness to achieve a reduction of symptoms when performing ADLs    Increase knowledge of respiratory medications and ability to use respiratory devices properly  Yes    Intervention Provide education and demonstration as needed of appropriate use of medications, inhalers, and oxygen therapy.    Expected Outcomes Short Term: Achieves understanding of medications use. Understands that oxygen is a medication prescribed by physician. Demonstrates appropriate use of inhaler and oxygen therapy.;Long Term: Maintain appropriate use of medications, inhalers, and oxygen therapy.             Core Components/Risk Factors/Patient Goals Review:   Goals and Risk Factor Review     Row Name 08/30/22 1402 09/18/22 1531 10/28/22 1332         Core Components/Risk Factors/Patient Goals Review   Personal  Goals Review Improve shortness of breath with ADL's;Develop more efficient breathing techniques such as purse lipped breathing and diaphragmatic breathing and practicing self-pacing with activity.;Increase knowledge of respiratory medications and ability to use respiratory devices properly. Develop more efficient breathing techniques such as purse lipped breathing and diaphragmatic breathing and practicing self-pacing with activity.;Increase knowledge of respiratory medications and ability to use respiratory devices properly.;Improve shortness of breath with ADL's Improve shortness of breath with ADL's;Develop more efficient breathing techniques such as purse lipped breathing and diaphragmatic breathing and practicing self-pacing with activity.     Review Pt is scheduled to begin exercise next week. Will continue to monitor.  Zada has attended 4 sessions at this time. She is currently exercising on the Octane and the Treadmill. She has been able to increase her workload and METS on the Octane. She has also been able to increase her speed and incline on treadmill. Winnie is able to report her RPE and dyspnea scores. Sanayah enjoy's coming to class and feels like it is helping build her endurance. Bula is doing very well in PR class. She brings her inhaler to class and has been able to demonstrate proper use to staff. She does still get SOB, but overall she feels it has gotten better. She uses purse lip breathing when she gets SOB. Saige is able to rate her perceived exertion and dyspnea scale when asked by staff. Dayanira loves coming to class and feels it has helped her so much. She states it has given her hope and that she is able to keep up with her kids now which is something she couldn't do before.     Expected Outcomes See admission goals See admission Goals See admission Goals              Core Components/Risk Factors/Patient Goals at Discharge (Final Review):   Goals and Risk Factor Review -  10/28/22 1332       Core Components/Risk Factors/Patient Goals Review   Personal Goals Review Improve shortness of breath with ADL's;Develop more efficient breathing techniques such as purse lipped breathing and diaphragmatic breathing and practicing self-pacing with activity.    Review Pamella is doing very well in PR class. She brings her inhaler to class and has been able to demonstrate proper use to staff. She does still get SOB, but overall she feels it has gotten better. She uses purse lip breathing when she gets SOB. Cyanna is able to rate her perceived exertion and dyspnea scale when asked by staff. Jelitza loves coming to class and feels it has helped her so much. She states it has given her hope and that she is able to keep up with her kids now which is something she couldn't do before.    Expected Outcomes See admission Goals             ITP Comments:Pt is making expected progress toward Pulmonary Rehab goals after completing 26 sessions. Recommend continued exercise, life style modification, education, and utilization of breathing techniques to increase stamina and strength, while also decreasing shortness of breath with exertion.  Dr. Mechele Collin is Medical Director for Pulmonary Rehab at [facility].

## 2022-12-05 ENCOUNTER — Encounter (HOSPITAL_COMMUNITY)
Admission: RE | Admit: 2022-12-05 | Discharge: 2022-12-05 | Disposition: A | Discharge status: Home / Self Care | Payer: BC Managed Care – PPO | Source: Ambulatory Visit | Attending: Pulmonary Disease | Admitting: Pulmonary Disease

## 2022-12-05 DIAGNOSIS — U099 Post covid-19 condition, unspecified: Secondary | ICD-10-CM

## 2022-12-05 DIAGNOSIS — R0602 Shortness of breath: Secondary | ICD-10-CM | POA: Diagnosis not present

## 2022-12-05 NOTE — Progress Notes (Signed)
Daily Session Note  Patient Details  Name: Cheryl Wright MRN: [id number] Date of Birth: March 18, 1976 Referring Provider:    Encounter Date: 12/05/2022  Check In:  Session Check In - 12/05/22 0832       Check-In   Supervising physician immediately available to respond to emergencies [facility] MD immediately available    Physician(s) Eligha Bridegroom, NP    Location [facility]    Staff Present Essie Hart, RN, BSN;Randi Idelle Crouch BS, ACSM-CEP, Exercise Physiologist;Timmie Calix Katrinka Blazing, RT    Virtual Visit No    Medication changes reported     No    Fall or balance concerns reported    No    Tobacco Cessation No Change    Warm-up and Cool-down Performed as group-led instruction    Resistance Training Performed Yes    VAD Patient? No    PAD/SET Patient? No      Pain Assessment   Currently in Pain? No/denies    Multiple Pain Sites No             Capillary Blood Glucose: No results found for this or any previous visit (from the past 24 hour(s)).    Social History   Tobacco Use  Smoking Status Former   Years: 2   Types: Cigarettes   Passive exposure: Past  Smokeless Tobacco Never    Goals Met:  Proper associated with RPD/PD & O2 Sat Independence with exercise equipment Exercise tolerated well No report of concerns or symptoms today Strength training completed today  Goals Unmet:  Not Applicable  Comments: Service time is from 0815 to 0930.    Dr. Mechele Collin is Medical Director for Pulmonary Rehab at [facility].

## 2022-12-10 ENCOUNTER — Encounter (HOSPITAL_COMMUNITY)
Admission: RE | Admit: 2022-12-10 | Discharge: 2022-12-10 | Disposition: A | Discharge status: Home / Self Care | Payer: BC Managed Care – PPO | Source: Ambulatory Visit | Attending: Pulmonary Disease | Admitting: Pulmonary Disease

## 2022-12-10 DIAGNOSIS — U099 Post covid-19 condition, unspecified: Secondary | ICD-10-CM

## 2022-12-10 DIAGNOSIS — R0602 Shortness of breath: Secondary | ICD-10-CM | POA: Diagnosis not present

## 2022-12-10 NOTE — Progress Notes (Signed)
Daily Session Note  Patient Details  Name: Cheryl Wright MRN: [id number] Date of Birth: [birth year] Referring Provider:    Encounter Date: 12/10/2022  Check In:  Session Check In - 12/10/22 0828       Check-In   Supervising physician immediately available to respond to emergencies [facility] MD immediately available    Physician(s) Bernadene Person, NP    Location [facility]    Staff Present Essie Hart, RN, BSN;Randi Dionisio Paschal, ACSM-CEP, Exercise Physiologist;Casey Charlean Sanfilippo, MS, ACSM-CEP, Exercise Physiologist    Virtual Visit No    Medication changes reported     No    Fall or balance concerns reported    No    Tobacco Cessation No Change    Warm-up and Cool-down Performed as group-led instruction    Resistance Training Performed Yes    VAD Patient? No    PAD/SET Patient? No      Pain Assessment   Currently in Pain? No/denies    Multiple Pain Sites No             Capillary Blood Glucose: No results found for this or any previous visit (from the past 24 hour(s)).    Social History   Tobacco Use  Smoking Status Former   Years: 2   Types: Cigarettes   Passive exposure: Past  Smokeless Tobacco Never    Goals Met:  Independence with exercise equipment Improved SOB with ADL's Exercise tolerated well No report of concerns or symptoms today Strength training completed today  Goals Unmet:  Not Applicable  Comments: Service time is from 0812 to 0931    Dr. Mechele Collin is Medical Director for Pulmonary Rehab at [facility].

## 2022-12-10 NOTE — Progress Notes (Signed)
Discharge Progress Report  Patient Details  Name: Cheryl Wright MRN: [id number] Date of Birth: 06-Jul-1975 Referring Provider:     Number of Visits: 28  Reason for Discharge:  Patient has met program and personal goals.  Smoking History:  Social History   Tobacco Use  Smoking Status Former   Years: 2   Types: Cigarettes   Passive exposure: Past  Smokeless Tobacco Never    Diagnosis:  COVID-19 long hauler  Shortness of breath  ADL [facility]:  Pulmonary Assessment Scores     Row Name 08/30/22 1109 11/21/22 1607       ADL [facility]   ADL Phase Entry --    SOB Score total 86 60      CAT Score   CAT Score 31 29             Initial Exercise Prescription:   Discharge Exercise Prescription (Final Exercise Prescription Changes):  Exercise Prescription Changes - 12/03/22 0900       Response to Exercise   Blood Pressure (Admit) 100/58    Blood Pressure (Exercise) 112/68    Blood Pressure (Exit) 104/58    Heart Rate (Admit) 100 bpm    Heart Rate (Exercise) 133 bpm    Heart Rate (Exit) 109 bpm    Oxygen Saturation (Admit) 96 %    Oxygen Saturation (Exercise) 96 %    Oxygen Saturation (Exit) 96 %    Rating of Perceived Exertion (Exercise) 13    Perceived Dyspnea (Exercise) 2    Duration Continue with 30 min of aerobic exercise without signs/symptoms of physical distress.    Intensity THRR unchanged      Progression   Progression Continue to progress workloads to maintain intensity without signs/symptoms of physical distress.      Resistance Training   Weight black bands    Reps 10-15    Time 10 Minutes      Interval Training   Interval Training Yes    Equipment Treadmill    Comments 45sec@5 .2/66min@3 .0      Treadmill   MPH 5.2    Grade 0    Minutes 15    METs 5.5      Rower   Level 5    Watts 47    Minutes 15             Functional Capacity:  6 Minute Walk     Row Name 08/30/22 1138 11/28/22 1548       6 Minute Walk   Phase Initial  Discharge    Distance 1080 feet 1800 feet    Distance % Change -- 50 %    Distance Feet Change -- 600 ft    Walk Time 6 minutes 6 minutes    # of Rest Breaks 0 0    MPH 2.05 3.41    METS 3.85 5.23    RPE 12 11    Perceived Dyspnea  1.5 2    VO2 Peak 13.48 18.3    Symptoms No No    Resting HR 102 bpm 98 bpm    Resting BP 98/72 98/58    Resting Oxygen Saturation  97 % 97 %    Exercise Oxygen Saturation  during 6 min walk 95 % 95 %    Max Ex. HR 105 bpm 110 bpm    Max Ex. BP 102/66 112/58    2 Minute Post BP 100/62 96/64      Interval HR   1 Minute HR 102 110  2 Minute HR 101 108    3 Minute HR 100 107    4 Minute HR 102 103    5 Minute HR 102 103    6 Minute HR 105 103    2 Minute Post HR 88 103    Interval Heart Rate? Yes Yes      Interval Oxygen   Interval Oxygen? Yes Yes    Baseline Oxygen Saturation % 97 % 97 %    1 Minute Oxygen Saturation % 96 % 96 %    1 Minute Liters of Oxygen 0 L 0 L    2 Minute Oxygen Saturation % 95 % 96 %    2 Minute Liters of Oxygen 0 L 0 L    3 Minute Oxygen Saturation % 95 % 96 %    3 Minute Liters of Oxygen 0 L 0 L    4 Minute Oxygen Saturation % 95 % 96 %    4 Minute Liters of Oxygen 0 L 0 L    5 Minute Oxygen Saturation % 96 % 95 %    5 Minute Liters of Oxygen 0 L 0 L    6 Minute Oxygen Saturation % 95 % 96 %    6 Minute Liters of Oxygen 0 L 0 L    2 Minute Post Oxygen Saturation % 96 % 97 %    2 Minute Post Liters of Oxygen 0 L 0 L             Psychological, QOL, Others - Outcomes: PHQ 2/9:    11/21/2022    4:06 PM 08/30/2022   11:01 AM  Depression screen PHQ 2/9  Decreased Interest 0 0  Down, Depressed, Hopeless 0 0  PHQ - 2 Score 0 0  Altered sleeping 0 0  Tired, decreased energy 0 0  Change in appetite 1 0  Feeling bad or failure about yourself  0 0  Trouble concentrating 1 0  Moving slowly or fidgety/restless 0 0  Suicidal thoughts 0 0  PHQ-9 Score 2 0  Difficult doing work/chores Very difficult Somewhat  difficult    Quality of Life:   Personal Goals: Goals established at orientation with interventions provided to work toward goal.  Personal Goals and Risk Factors at Admission - 08/30/22 1050       Core Components/Risk Factors/Patient Goals on Admission    Weight Management Weight Maintenance    Improve shortness of breath with ADL's Yes    Intervention Provide education, individualized exercise plan and daily activity instruction to help decrease symptoms of SOB with activities of daily living.    Expected Outcomes Short Term: Improve cardiorespiratory fitness to achieve a reduction of symptoms when performing ADLs    Increase knowledge of respiratory medications and ability to use respiratory devices properly  Yes    Intervention Provide education and demonstration as needed of appropriate use of medications, inhalers, and oxygen therapy.    Expected Outcomes Short Term: Achieves understanding of medications use. Understands that oxygen is a medication prescribed by physician. Demonstrates appropriate use of inhaler and oxygen therapy.;Long Term: Maintain appropriate use of medications, inhalers, and oxygen therapy.              Personal Goals Discharge:  Goals and Risk Factor Review     Row Name 08/30/22 1402 09/18/22 1531 10/28/22 1332         Core Components/Risk Factors/Patient Goals Review   Personal Goals Review Improve shortness of breath with ADL's;Develop more  efficient breathing techniques such as purse lipped breathing and diaphragmatic breathing and practicing self-pacing with activity.;Increase knowledge of respiratory medications and ability to use respiratory devices properly. Develop more efficient breathing techniques such as purse lipped breathing and diaphragmatic breathing and practicing self-pacing with activity.;Increase knowledge of respiratory medications and ability to use respiratory devices properly.;Improve shortness of breath with ADL's Improve  shortness of breath with ADL's;Develop more efficient breathing techniques such as purse lipped breathing and diaphragmatic breathing and practicing self-pacing with activity.     Review Pt is scheduled to begin exercise next week. Will continue to monitor. Cheryl Wright has attended 4 sessions at this time. She is currently exercising on the Octane and the Treadmill. She has been able to increase her workload and METS on the Octane. She has also been able to increase her speed and incline on treadmill. Addalyne is able to report her RPE and dyspnea scores. Donise enjoy's coming to class and feels like it is helping build her endurance. Zoria is doing very well in PR class. She brings her inhaler to class and has been able to demonstrate proper use to staff. She does still get SOB, but overall she feels it has gotten better. She uses purse lip breathing when she gets SOB. January is able to rate her perceived exertion and dyspnea scale when asked by staff. Chaniya loves coming to class and feels it has helped her so much. She states it has given her hope and that she is able to keep up with her kids now which is something she couldn't do before.     Expected Outcomes See admission goals See admission Goals See admission Goals              Exercise Goals and Review:  Exercise Goals     Row Name 08/30/22 1052 08/30/22 1352 09/24/22 0906 10/31/22 0857 11/25/22 1119     Exercise Goals   Increase Physical Activity Yes Yes Yes Yes Yes   Intervention Provide advice, education, support and counseling about physical activity/exercise needs.;Develop an individualized exercise prescription for aerobic and resistive training based on initial evaluation findings, risk stratification, comorbidities and participant's personal goals. Provide advice, education, support and counseling about physical activity/exercise needs.;Develop an individualized exercise prescription for aerobic and resistive training based on initial  evaluation findings, risk stratification, comorbidities and participant's personal goals. Provide advice, education, support and counseling about physical activity/exercise needs.;Develop an individualized exercise prescription for aerobic and resistive training based on initial evaluation findings, risk stratification, comorbidities and participant's personal goals. Provide advice, education, support and counseling about physical activity/exercise needs.;Develop an individualized exercise prescription for aerobic and resistive training based on initial evaluation findings, risk stratification, comorbidities and participant's personal goals. Provide advice, education, support and counseling about physical activity/exercise needs.;Develop an individualized exercise prescription for aerobic and resistive training based on initial evaluation findings, risk stratification, comorbidities and participant's personal goals.   Expected Outcomes Short Term: Attend rehab on a regular basis to increase amount of physical activity.;Long Term: Exercising regularly at least 3-5 days a week.;Long Term: Add in home exercise to make exercise part of routine and to increase amount of physical activity. Short Term: Attend rehab on a regular basis to increase amount of physical activity.;Long Term: Exercising regularly at least 3-5 days a week.;Long Term: Add in home exercise to make exercise part of routine and to increase amount of physical activity. Short Term: Attend rehab on a regular basis to increase amount of physical activity.;Long Term: Exercising regularly at  least 3-5 days a week.;Long Term: Add in home exercise to make exercise part of routine and to increase amount of physical activity. Short Term: Attend rehab on a regular basis to increase amount of physical activity.;Long Term: Exercising regularly at least 3-5 days a week.;Long Term: Add in home exercise to make exercise part of routine and to increase amount of  physical activity. Short Term: Attend rehab on a regular basis to increase amount of physical activity.;Long Term: Exercising regularly at least 3-5 days a week.;Long Term: Add in home exercise to make exercise part of routine and to increase amount of physical activity.   Increase Strength and Stamina Yes Yes Yes Yes Yes   Intervention Provide advice, education, support and counseling about physical activity/exercise needs.;Develop an individualized exercise prescription for aerobic and resistive training based on initial evaluation findings, risk stratification, comorbidities and participant's personal goals. Provide advice, education, support and counseling about physical activity/exercise needs.;Develop an individualized exercise prescription for aerobic and resistive training based on initial evaluation findings, risk stratification, comorbidities and participant's personal goals. Provide advice, education, support and counseling about physical activity/exercise needs.;Develop an individualized exercise prescription for aerobic and resistive training based on initial evaluation findings, risk stratification, comorbidities and participant's personal goals. Provide advice, education, support and counseling about physical activity/exercise needs.;Develop an individualized exercise prescription for aerobic and resistive training based on initial evaluation findings, risk stratification, comorbidities and participant's personal goals. Provide advice, education, support and counseling about physical activity/exercise needs.;Develop an individualized exercise prescription for aerobic and resistive training based on initial evaluation findings, risk stratification, comorbidities and participant's personal goals.   Expected Outcomes Short Term: Increase workloads from initial exercise prescription for resistance, speed, and METs.;Short Term: Perform resistance training exercises routinely during rehab and add in  resistance training at home;Long Term: Improve cardiorespiratory fitness, muscular endurance and strength as measured by increased METs and functional capacity ( ) Short Term: Increase workloads from initial exercise prescription for resistance, speed, and METs.;Short Term: Perform resistance training exercises routinely during rehab and add in resistance training at home;Long Term: Improve cardiorespiratory fitness, muscular endurance and strength as measured by increased METs and functional capacity ( ) Short Term: Increase workloads from initial exercise prescription for resistance, speed, and METs.;Short Term: Perform resistance training exercises routinely during rehab and add in resistance training at home;Long Term: Improve cardiorespiratory fitness, muscular endurance and strength as measured by increased METs and functional capacity ( ) Short Term: Increase workloads from initial exercise prescription for resistance, speed, and METs.;Short Term: Perform resistance training exercises routinely during rehab and add in resistance training at home;Long Term: Improve cardiorespiratory fitness, muscular endurance and strength as measured by increased METs and functional capacity ( ) Short Term: Increase workloads from initial exercise prescription for resistance, speed, and METs.;Short Term: Perform resistance training exercises routinely during rehab and add in resistance training at home;Long Term: Improve cardiorespiratory fitness, muscular endurance and strength as measured by increased METs and functional capacity ( )   Able to understand and use rate of perceived exertion (RPE) scale Yes Yes Yes Yes Yes   Intervention Provide education and explanation on how to use RPE scale Provide education and explanation on how to use RPE scale Provide education and explanation on how to use RPE scale Provide education and explanation on how to use RPE scale Provide education and explanation on how to  use RPE scale   Expected Outcomes Short Term: Able to use RPE daily in rehab to express subjective intensity level;Long Term:  Able to use  RPE to guide intensity level when exercising independently Short Term: Able to use RPE daily in rehab to express subjective intensity level;Long Term:  Able to use RPE to guide intensity level when exercising independently Short Term: Able to use RPE daily in rehab to express subjective intensity level;Long Term:  Able to use RPE to guide intensity level when exercising independently Short Term: Able to use RPE daily in rehab to express subjective intensity level;Long Term:  Able to use RPE to guide intensity level when exercising independently Short Term: Able to use RPE daily in rehab to express subjective intensity level;Long Term:  Able to use RPE to guide intensity level when exercising independently   Able to understand and use Dyspnea scale Yes Yes Yes Yes Yes   Intervention Provide education and explanation on how to use Dyspnea scale Provide education and explanation on how to use Dyspnea scale Provide education and explanation on how to use Dyspnea scale Provide education and explanation on how to use Dyspnea scale Provide education and explanation on how to use Dyspnea scale   Expected Outcomes Short Term: Able to use Dyspnea scale daily in rehab to express subjective sense of shortness of breath during exertion;Long Term: Able to use Dyspnea scale to guide intensity level when exercising independently Short Term: Able to use Dyspnea scale daily in rehab to express subjective sense of shortness of breath during exertion;Long Term: Able to use Dyspnea scale to guide intensity level when exercising independently Short Term: Able to use Dyspnea scale daily in rehab to express subjective sense of shortness of breath during exertion;Long Term: Able to use Dyspnea scale to guide intensity level when exercising independently Short Term: Able to use Dyspnea scale daily in  rehab to express subjective sense of shortness of breath during exertion;Long Term: Able to use Dyspnea scale to guide intensity level when exercising independently Short Term: Able to use Dyspnea scale daily in rehab to express subjective sense of shortness of breath during exertion;Long Term: Able to use Dyspnea scale to guide intensity level when exercising independently   Knowledge and understanding of Target Heart Rate Range (THRR) Yes Yes Yes Yes Yes   Intervention Provide education and explanation of THRR including how the numbers were predicted and where they are located for reference Provide education and explanation of THRR including how the numbers were predicted and where they are located for reference Provide education and explanation of THRR including how the numbers were predicted and where they are located for reference Provide education and explanation of THRR including how the numbers were predicted and where they are located for reference Provide education and explanation of THRR including how the numbers were predicted and where they are located for reference   Expected Outcomes Short Term: Able to state/look up THRR;Long Term: Able to use THRR to govern intensity when exercising independently;Short Term: Able to use daily as guideline for intensity in rehab Short Term: Able to state/look up THRR;Long Term: Able to use THRR to govern intensity when exercising independently;Short Term: Able to use daily as guideline for intensity in rehab Short Term: Able to state/look up THRR;Long Term: Able to use THRR to govern intensity when exercising independently;Short Term: Able to use daily as guideline for intensity in rehab Short Term: Able to state/look up THRR;Long Term: Able to use THRR to govern intensity when exercising independently;Short Term: Able to use daily as guideline for intensity in rehab Short Term: Able to state/look up THRR;Long Term: Able to use THRR to  govern intensity when  exercising independently;Short Term: Able to use daily as guideline for intensity in rehab   Able to check pulse independently -- -- -- -- --   Intervention -- -- -- -- --   Expected Outcomes -- -- -- -- --   Understanding of Exercise Prescription Yes Yes Yes Yes Yes   Intervention Provide education, explanation, and written materials on patient's individual exercise prescription Provide education, explanation, and written materials on patient's individual exercise prescription Provide education, explanation, and written materials on patient's individual exercise prescription Provide education, explanation, and written materials on patient's individual exercise prescription Provide education, explanation, and written materials on patient's individual exercise prescription   Expected Outcomes Short Term: Able to explain program exercise prescription;Long Term: Able to explain home exercise prescription to exercise independently Short Term: Able to explain program exercise prescription;Long Term: Able to explain home exercise prescription to exercise independently Short Term: Able to explain program exercise prescription;Long Term: Able to explain home exercise prescription to exercise independently Short Term: Able to explain program exercise prescription;Long Term: Able to explain home exercise prescription to exercise independently Short Term: Able to explain program exercise prescription;Long Term: Able to explain home exercise prescription to exercise independently            Exercise Goals Re-Evaluation:  Exercise Goals Re-Evaluation     Row Name 08/30/22 1352 09/26/22 0651 10/31/22 0857 11/25/22 1119       Exercise Goal Re-Evaluation   Exercise Goals Review Increase Physical Activity;Able to understand and use Dyspnea scale;Understanding of Exercise Prescription;Increase Strength and Stamina;Knowledge and understanding of Target Heart Rate Range (THRR);Able to understand and use rate of  perceived exertion (RPE) scale;Able to check pulse independently Increase Physical Activity;Able to understand and use Dyspnea scale;Understanding of Exercise Prescription;Increase Strength and Stamina;Knowledge and understanding of Target Heart Rate Range (THRR);Able to understand and use rate of perceived exertion (RPE) scale;Able to check pulse independently Increase Physical Activity;Able to understand and use Dyspnea scale;Understanding of Exercise Prescription;Increase Strength and Stamina;Knowledge and understanding of Target Heart Rate Range (THRR);Able to understand and use rate of perceived exertion (RPE) scale;Able to check pulse independently Increase Physical Activity;Able to understand and use Dyspnea scale;Understanding of Exercise Prescription;Increase Strength and Stamina;Knowledge and understanding of Target Heart Rate Range (THRR);Able to understand and use rate of perceived exertion (RPE) scale;Able to check pulse independently    Comments Patient is scheduled to begin exercise next week. Will continue to monitor and progress as able. Dayzee has completed 6 exercise sessions. She exercises for 15 min on the recumbent elliptical and treadmill. She averages 5.6 METs at level 2 on the recumbent elliptical and 3.48 METs on the treadmill. Conley performs the warmup and cooldown standing without limitations. Meloday has increased her workload for both exercise modes. She tolereates progressions well. METs have also increased for both exercise modes. Zarra has started doing interval training on the treadmill. Amauri does 1 min of 2.1 mph with 0.5% incline and 2.1 mph with 3.0% incline. Both intervals are somewhat easy for her. Will adjust intervals to increase intensity slightly. Betzy is very motivated to exercise and always has a postive attitude. Will continue to monitor and progress as able. Loyda has completed 16 exercise sessions. She exercises for 15 min on the rower and treadmill. She  averages 39 watts at level 3 on the rower and 8.66 METs on the treadmill. Ireta jogs for 30 sec at 5 mph and walks for 2 min at 3 mph. Samanthamarie performs the warmup  and cooldown standing without limitations. Uliana has made several progressions since starting the program. Araseli has switched from the recumbent elliptical and starting her jogging intervals. She tolerates both progressions very well. Angelize is very motivated to exericise as she is willing to increase her workload every other week. I have discussed home exercise with Winefred. She does exercise at home as she has a treadmill to walk on and walks outside with her family. I am confident in Munachimso carrying out an exercise regimen at home. Will continue to monitor and progress as able Karole has completed 23 exercise sessions. She exercises for 15 min on the rower and treadmill. She averages 45 watts at level 4 on the rower and 7.1 METs on the treadmill. Laylamarie jogs for 45 sec at 5 mph and walks for 1:15 min at 3 mph. Jammy performs the warmup and cooldown standing without limitations. Havanah has progressed her treadmill interval. Her jogging interval has increased to 45 sec. She tolerates progressions very well. Denyse is very motivated to exercise as she has extended her time in PR. She reports exercising consistently at home. Bettina recently requested ideas for walking interval at home. Althout Kassandra is progression well, I am unsure how much more she will progress. She is starting to reach a plateau in her functional capacity. Sheilia has met most, if not all of her exercise goals. Will continue to monitor and progress as able.    Expected Outcomes Through exercise at rehab and home, the patient will decrease shortness of breath with daily activities and feel confident in carrying out an exercise regimen at home. Through exercise at rehab and home, the patient will decrease shortness of breath with daily activities and feel confident in carrying out an  exercise regimen at home. Through exercise at rehab and home, the patient will decrease shortness of breath with daily activities and feel confident in carrying out an exercise regimen at home. Through exercise at rehab and home, the patient will decrease shortness of breath with daily activities and feel confident in carrying out an exercise regimen at home.             Nutrition & Weight - Outcomes:  Pre Biometrics - 08/30/22 1035       Pre Biometrics   Grip Strength 18 kg              Nutrition:  Nutrition Therapy & Goals - 11/28/22 1049       Nutrition Therapy   Diet General Healthful Diet      Personal Nutrition Goals   Nutrition Goal Patient to improve diet quality by using the plate method as a guide for meal planning to include lean protein/plant protein, fruits, vegetables, whole grains, nonfat dairy as part of a well-balanced diet.    Comments Goals in action. She is trying to eat 3-4 smaller meals daily as she describes increased shortness of breath with larger meals. She is trying to increase protein to aid with lean muscle mass; she has implemented a protein shake daily ([employer]). Have reviewd the Mediterranean diet, reducing refined carbohydrates/simple sugars, regular intake of omega 3 rich foods, and high fiber intake. She continues to monitor blood pressues and continues follow-up with rheumatology. She has maintained her weight since starting with our program. Adella will continue to benefit from participation in pulmonary rehab for nutrition exercise and lfiestyle modification.      Intervention Plan   Intervention Prescribe, educate and counsel regarding individualized specific dietary modifications aiming  towards targeted core components such as weight, hypertension, lipid management, diabetes, heart failure and other comorbidities.;Nutrition handout(s) given to patient.    Expected Outcomes Short Term Goal: Understand basic principles of dietary content,  such as calories, fat, sodium, cholesterol and nutrients.;Long Term Goal: Adherence to prescribed nutrition plan.             Nutrition Discharge:  Nutrition Assessments - 11/21/22 1412       Rate Your Plate Scores   Post Score 72             Education Questionnaire Score:  Knowledge Questionnaire Score - 11/21/22 1607       Knowledge Questionnaire Score   Post Score 18/18             Goals reviewed with patient; copy given to patient.

## 2022-12-13 ENCOUNTER — Encounter (HOSPITAL_BASED_OUTPATIENT_CLINIC_OR_DEPARTMENT_OTHER): Payer: Self-pay | Admitting: Pulmonary Disease

## 2023-01-06 ENCOUNTER — Encounter (HOSPITAL_BASED_OUTPATIENT_CLINIC_OR_DEPARTMENT_OTHER): Payer: Self-pay | Admitting: Pulmonary Disease

## 2023-01-06 MED ORDER — PREDNISONE 10 MG PO TABS: ORAL_TABLET | ORAL | 0 refills | Status: DC

## 2023-01-06 NOTE — Addendum Note (Signed)
Addended by: Luciano Cutter on: 01/06/2023 08:33 PM   Modules accepted: Orders

## 2023-01-06 NOTE — Addendum Note (Signed)
Addended by: Luciano Cutter on: 01/06/2023 08:55 PM   Modules accepted: Orders

## 2023-01-06 NOTE — Telephone Encounter (Signed)
Attempted to send prednisone taper. Electronic fail x 2. Re-attempt in the morning. Keep encounter open until rx sent.

## 2023-01-07 MED ORDER — PREDNISONE 10 MG PO TABS: ORAL_TABLET | ORAL | 0 refills | Status: AC

## 2023-01-07 NOTE — Addendum Note (Signed)
Addended by: Luciano Cutter on: 01/07/2023 04:27 PM   Modules accepted: Orders

## 2023-01-17 ENCOUNTER — Telehealth (HOSPITAL_COMMUNITY): Payer: Self-pay

## 2023-01-17 NOTE — Telephone Encounter (Signed)
Called pt to let them know about the Pulmonary Wellness program at [facility] Pt states they are interested. Will put referral in.

## 2023-01-20 ENCOUNTER — Encounter (HOSPITAL_BASED_OUTPATIENT_CLINIC_OR_DEPARTMENT_OTHER): Payer: Self-pay | Admitting: Pulmonary Disease

## 2023-01-20 DIAGNOSIS — R051 Acute cough: Secondary | ICD-10-CM

## 2023-01-21 ENCOUNTER — Ambulatory Visit (HOSPITAL_BASED_OUTPATIENT_CLINIC_OR_DEPARTMENT_OTHER)
Admission: RE | Admit: 2023-01-21 | Discharge: 2023-01-21 | Disposition: A | Discharge status: Home / Self Care | Payer: BC Managed Care – PPO | Source: Ambulatory Visit | Attending: Pulmonary Disease | Admitting: Pulmonary Disease

## 2023-01-21 DIAGNOSIS — R051 Acute cough: Secondary | ICD-10-CM | POA: Insufficient documentation

## 2023-01-21 MED ORDER — HYDROCOD POLI-CHLORPHE POLI ER 10-8 MG/5ML PO SUER: 5.0000 mL | Freq: Every evening | ORAL | 0 refills | Status: DC | PRN

## 2023-01-28 ENCOUNTER — Other Ambulatory Visit (HOSPITAL_BASED_OUTPATIENT_CLINIC_OR_DEPARTMENT_OTHER): Payer: Self-pay | Admitting: Pulmonary Disease

## 2023-02-03 ENCOUNTER — Ambulatory Visit (HOSPITAL_BASED_OUTPATIENT_CLINIC_OR_DEPARTMENT_OTHER)
Admission: RE | Admit: 2023-02-03 | Discharge: 2023-02-03 | Disposition: A | Discharge status: Home / Self Care | Payer: BC Managed Care – PPO | Source: Ambulatory Visit | Attending: Pulmonary Disease | Admitting: Pulmonary Disease

## 2023-02-03 DIAGNOSIS — A319 Mycobacterial infection, unspecified: Secondary | ICD-10-CM | POA: Diagnosis present

## 2023-02-18 ENCOUNTER — Encounter (HOSPITAL_BASED_OUTPATIENT_CLINIC_OR_DEPARTMENT_OTHER): Payer: Self-pay | Admitting: Pulmonary Disease

## 2023-02-18 ENCOUNTER — Ambulatory Visit (HOSPITAL_BASED_OUTPATIENT_CLINIC_OR_DEPARTMENT_OTHER): Payer: BC Managed Care – PPO | Admitting: Pulmonary Disease

## 2023-02-18 VITALS — BP 90/64 | HR 76 | Resp 16 | Ht 68.25 in | Wt 156.8 lb

## 2023-02-18 DIAGNOSIS — U099 Post covid-19 condition, unspecified: Secondary | ICD-10-CM

## 2023-02-18 DIAGNOSIS — A310 Pulmonary mycobacterial infection: Secondary | ICD-10-CM

## 2023-02-18 DIAGNOSIS — A319 Mycobacterial infection, unspecified: Secondary | ICD-10-CM

## 2023-02-18 NOTE — Progress Notes (Signed)
Subjective:   PATIENT ID: Cheryl Wright GENDER: female DOB: 1975-10-06, MRN: [id number]  Chief Complaint  Patient presents with   Follow-up    3 month FU after CT- doing ok    Reason for Visit: Follow-up  Ms. Cheryl Wright is a 47 year old female former smoker with endometriosis, migraine, seasonal allergies and GAD who presents for follow-up for covid long hauler.  Initial consult She has had covid in September 2023 and again in December 2023. Her covid symptoms were improving until end of December when she began having more productive cough and left pleuritic chest pain. She was treated with Septra and albuterol inhaler 07/10/22 and improved cough but has worsening shortness of breath and congestion. Tested positive for flu B on 07/18/22. Stopped taking Septra after taking 7 days. Took tamiflu and completed it. And took levaquin when CXR showed possible pneumonia  She had a CT Chest on 07/30/22 with impression: 1. Reticulonodular and tree in bud type opacities in right middle lobe suspected be infectious or inflammatory in etiology. Consider atypical etiologies including MAI. 2. Several small lung nodules as detailed, 5 mm and less. No follow-up needed if patient is low risk. CXR report on 07/18/21 with interstitial changes at the bases however resolved on follow-up CXR 07/29/22.  She reports her nasal congestion and productive cough that worsened in the beginning of January. Went on a ski trip and felt sick during the trip. Worsening shortness of breath. After finishing her antibiotics she is laying in bed and feeling fatigued. She was evaluated in ED on 2/5. Treated with IV fluids, steroids. Has completed steroids but she feels it was not effective.  At baseline she reports she does have wheezing and cough when exposed to outdoor allergens. Uses saline nasal solution. Associated with chest tightness. Now she feels like she has chronic symptoms. Denies childhood asthma. Albuterol initially  helped but does not feel like albuterol inhaler anymore due to head spinning. Cough is less productive. Has nasal congestion. Has wheezing/whistle with laying down.  09/17/22 Since our last visit she has started pulmonary rehab. Has received one round steroids ordered on 08/16/22. No further steroids. She reports shortness of breath has improved. Still intermittently feeling fatigued, shortness of breath and chest pain. Albuterol use is variable. At least 1-2 times daily. Husband presents chart of her chronic chest pain, shortness of breath and fatigue which he reports can be subjective but notes she is overall feeling better.  During exercise at rehab she has had systolic BPs in the upper 80s.associated with dizziness. She reports good hydration and currently normotensive in clinic today.  11/15/22 She has completed pulmonary rehab and planning to start PREP program in June. She felt the program was beneficial. Her energy has improved but sometimes feel fatigued after exercising. Improved shortness of breath overall but still difficulty with increased humidity. Recent congestion in the last week. Some nasal drainage with dry cough. Occasionally uses [place].  02/18/23 Since our last visit she took prednisone course in June and July for cough and wheezing. Still coughing at night. Reports left chest pain for several months that hasn't changed. Denies recent fevers and chills. Has been off nasal sprays for some time and restarted this. She reports feeling more congested. Has been compliant with Symbicort and airsupura 2-3 times a week before exercise. Exercising regularly.  Social History: Social smoker <10 years weekly. <1/2 ppd. Quit in 2202 Second hand smoke exposure  Past Medical History:  Diagnosis Date  Blood transfusion without reported diagnosis    pp hemorrhage 2011 twin delivery   Endometriosis    stage four   Frequent UTI    Headache(784.0)    migraine   Hx of preeclampsia, prior  pregnancy, currently pregnant    Long COVID    Patient states caused lung damage   Normal pregnancy 06/01/2012   SVD (spontaneous vaginal delivery) 06/02/2012     Family History  Problem Relation Age of Onset   Hyperlipidemia Mother    Hypertension Mother    Osteoporosis Mother    Osteoarthritis Mother    Rheum arthritis Father    Lung cancer Father        mesothelioma from [place]   Stroke Maternal Grandmother    Breast cancer Paternal Grandmother 19   Colon cancer Paternal Grandmother 24   Lung cancer Paternal Grandfather        dx in his 78s; smoker   Ovarian cancer Other        paternal grandfather's 2 sisters and mother in their 33s-60s   Breast cancer Other        father's paternal first cousin dx in her 63s   Breast cancer Other        paternal grandmother's mother dx in her 31s     Social History   Occupational History   Not on file  Tobacco Use   Smoking status: Former    Types: Cigarettes    Passive exposure: Past   Smokeless tobacco: Never  Vaping Use   Vaping status: Never Used  Substance and Sexual Activity   Alcohol use: Not Currently    Comment: rarely   Drug use: No   Sexual activity: Yes    Comment: preg    Allergies  Allergen Reactions   Penicillins Anaphylaxis, Hives and Swelling    Childhood reaction     Outpatient Medications Prior to Visit  Medication Sig Dispense Refill   albuterol (PROVENTIL) (2.5 MG/3ML) 0.083% nebulizer solution Take 3 mLs (2.5 mg total) by nebulization every 6 (six) hours as needed for wheezing or shortness of breath. 75 mL 2   Albuterol-Budesonide (AIRSUPRA) 90-80 MCG/ACT AERO Inhale 2 puffs into the lungs every 4 (four) hours as needed. 10.7 g 5   budesonide-formoterol (SYMBICORT) 160-4.5 MCG/ACT inhaler Inhale 2 puffs into the lungs in the morning and at bedtime. 10.2 each 3   chlorpheniramine-HYDROcodone (TUSSIONEX) 10-8 MG/5ML Take 5 mLs by mouth at bedtime as needed for cough. 115 mL 0   ibuprofen  (ADVIL,MOTRIN) 800 MG tablet Take 1 tablet (800 mg total) by mouth every 8 (eight) hours as needed for pain. 40 tablet 1   loratadine (CLARITIN) 10 MG tablet Take 10 mg by mouth daily.     norgestimate-ethinyl estradiol (ORTHO-CYCLEN) 0.25-35 MG-MCG tablet Take 1 tablet by mouth daily.     sertraline (ZOLOFT) 50 MG tablet Take 50 mg by mouth daily.     No facility-administered medications prior to visit.    Review of Systems  Constitutional:  Negative for chills, diaphoresis, fever, malaise/fatigue and weight loss.  HENT:  Negative for congestion.   Respiratory:  Positive for cough and wheezing. Negative for hemoptysis, sputum production and shortness of breath.   Cardiovascular:  Negative for chest pain, palpitations and leg swelling.     Objective:   Vitals:   02/18/23 1042  BP: 90/64  Pulse: 76  Resp: 16  SpO2: 97%  Weight: 156 lb 12.8 oz (71.1 kg)  Height: 5' 8.25" (1.734 m)  SpO2: 97 %  Physical Exam: General: Well-appearing, no acute distress HENT: [place], AT Eyes: EOMI, no scleral icterus Respiratory: Clear to auscultation bilaterally.  No crackles, wheezing or rales Cardiovascular: RRR, -M/R/G, no JVD Extremities:-Edema,-tenderness Neuro: AAO x4, CNII-XII grossly intact Psych: Normal mood, normal affect  Data Reviewed:  Imaging: CT Chest 07/30/22 1. Reticulonodular and tree-in-bud type opacities noted in the right  middle lobe suspected to be infectious or inflammatory in etiology.  Consider atypical etiologies including MAI.  2. Several small lung nodules as detailed, 5 mm and less. No  follow-up needed if patient is low-risk (and has no known or  suspected primary neoplasm).   CT Chest 02/03/23 - RML with reticulonodular and tree in bud opacities  PFT: 09/17/22 FVC 4.43 (107%) FEV1 3.19 (97%) Ratio 72  TLC 124% DLCO 98% Interpretation: Normal spirometry. Mild hyperinflation and air trapping. Normal gas exchange.  Labs: CBC    Component Value Date/Time    WBC 10.6 (H) 07/29/2022 1425   RBC 4.26 07/29/2022 1425   HGB 14.3 07/29/2022 1425   HCT 41.3 07/29/2022 1425   PLT 289 07/29/2022 1425   MCV 96.9 07/29/2022 1425   MCH 33.6 07/29/2022 1425   MCHC 34.6 07/29/2022 1425   RDW 12.2 07/29/2022 1425   Abs eos 07/18/22 100     Assessment & Plan:   Discussion: 47 year old female former smoker with endometriosis, migraine, seasonal allergies and GAD who presents for follow-up for covid long hauler. Overall improving stamina and symptoms after graduating pulmonary rehab. Still intermittently fatigued and short of breath. Recently in the last 1-2 months has had increased upper airway congestion, cough and wheezing. May be related to recent URI vs atypical infection. Unable to produce sputum sample today. Reviewed CT with increased nodularity in RML. Discussed low yield on bronchoscopy based on current minimal findings on CT but offered as an option. After further discussion will plan to repeat CT since respiratory symptoms are currently stable and mild.  Counseled on inhaler use and vaccinations (influenza and covid eligible). Advised good hand hygiene and avoiding sick contacts when able. She stills masks in certain situations for protections.  MAI/Atypical infection on CT Clinically low suspicion for this. Previously reviewed CD however not uploaded system --Reviewed CT. Possible atypical infection --Discussed bronchoscopy --ORDER CT Chest without in 3 months. If abnormalities persist/worsen will arrange bronchoscopy  Post-covid long hauler  Chronic bronchitis --CONTINUE Symbicort 160-4.5 mcg TWO puffs in the morning and evening. Rinse mouth out after use to prevent thrush --CONTINUE Airsupra 1-2 puffs AS NEEDED every 4 hours --CONTINUE regular aerobic exercise  --Pulm rehab planning to referral for [facility] --Following [facility] program  Health Maintenance Immunization History  Administered Date(s) Administered   Influenza  Split 03/22/2014   Influenza,inj,Quad PF,6+ Mos 05/23/2021, 05/24/2022   PFIZER(Purple Top)SARS-COV-2 Vaccination 09/10/2019, 10/05/2019, 05/25/2020   Pfizer Covid-19 Vaccine Bivalent Booster 22yrs & up 07/14/2021   Tdap 03/26/2012, 05/24/2022   CT Lung Screen - not qualifed  Orders Placed This Encounter  Procedures   CT Chest Wo Contrast    Schedule at end of November for 3 months    Standing Status:   Future    Standing Expiration Date:   02/18/2024    Order Specific Question:   Is patient pregnant?    Answer:   No    Order Specific Question:   Preferred imaging location?    Answer:   [facility]   No orders of the defined types were placed  in this encounter.   Return in about 3 months (around 05/21/2023) for after CT scan.   I have spent a total time of 35-minutes on the day of the appointment including chart review, data review, collecting history, coordinating care and discussing medical diagnosis and plan with the patient/family. Past medical history, allergies, medications were reviewed. Pertinent imaging, labs and tests included in this note have been reviewed and interpreted independently by me.  Ahren Pettinger Mechele Collin, MD [facility] 02/18/2023 11:44 AM  Office Number [phone]

## 2023-02-18 NOTE — Patient Instructions (Addendum)
MAI/Atypical infection on CT Clinically low suspicion for this. Previously reviewed CD however not uploaded system --Reviewed CT. Possible atypical infection --Discussed bronchoscopy --ORDER CT Chest without in 3 months. If abnormalities persist/worsen will arrange bronchoscopy  Post-covid long hauler  Chronic bronchitis --CONTINUE Symbicort 160-4.5 mcg TWO puffs in the morning and evening. Rinse mouth out after use to prevent thrush --CONTINUE Airsupra 1-2 puffs AS NEEDED every 4 hours --CONTINUE regular aerobic exercise  --Pulm rehab planning to referral for [facility] program

## 2023-05-12 ENCOUNTER — Ambulatory Visit (HOSPITAL_BASED_OUTPATIENT_CLINIC_OR_DEPARTMENT_OTHER)
Admission: RE | Admit: 2023-05-12 | Discharge: 2023-05-12 | Disposition: A | Discharge status: Home / Self Care | Payer: BC Managed Care – PPO | Source: Ambulatory Visit | Attending: Pulmonary Disease | Admitting: Pulmonary Disease

## 2023-05-12 ENCOUNTER — Ambulatory Visit (INDEPENDENT_AMBULATORY_CARE_PROVIDER_SITE_OTHER): Payer: BC Managed Care – PPO | Admitting: Internal Medicine

## 2023-05-12 ENCOUNTER — Encounter: Payer: Self-pay | Admitting: Internal Medicine

## 2023-05-12 VITALS — BP 93/66 | HR 66 | Resp 14 | Ht 69.0 in | Wt 162.0 lb

## 2023-05-12 DIAGNOSIS — R918 Other nonspecific abnormal finding of lung field: Secondary | ICD-10-CM | POA: Insufficient documentation

## 2023-05-12 DIAGNOSIS — A310 Pulmonary mycobacterial infection: Secondary | ICD-10-CM | POA: Insufficient documentation

## 2023-05-12 DIAGNOSIS — L509 Urticaria, unspecified: Secondary | ICD-10-CM

## 2023-05-12 DIAGNOSIS — U099 Post covid-19 condition, unspecified: Secondary | ICD-10-CM | POA: Diagnosis not present

## 2023-05-12 DIAGNOSIS — Z8709 Personal history of other diseases of the respiratory system: Secondary | ICD-10-CM

## 2023-05-12 DIAGNOSIS — M2550 Pain in unspecified joint: Secondary | ICD-10-CM

## 2023-05-12 DIAGNOSIS — R768 Other specified abnormal immunological findings in serum: Secondary | ICD-10-CM | POA: Diagnosis not present

## 2023-05-12 NOTE — Patient Instructions (Addendum)
I recommend checking out the [facility] [place] patient-centered guide for fibromyalgia and chronic pain management: https://howell-gardner.net/  Myofascial Pain Syndrome Myofascial pain syndrome is a pain disorders. You may feel this pain mainly in your muscles. Myofascial pain syndrome: Always has tender points in the muscles that will cause pain when pressed (trigger points). The pain may come and go. Usually affects your neck, upper back, and shoulder areas. The pain often moves into your arms and hands. This is fairly common and can cause enough pain and fatigue to make day-to-day activities difficult. Both can be hard to diagnose because their symptoms are common in many other conditions. What are the causes? The exact causes of these conditions are not known. What increases the risk? You are more likely to develop either of these conditions if: You have a family history of the condition. You are female. You have certain triggers, such as: Spine disorders. An injury (trauma) or other physical stressors. Being under a lot of stress. Medical conditions such as osteoarthritis, rheumatoid arthritis, or lupus. What are the signs or symptoms? Fibromyalgia The main symptom of fibromyalgia is widespread pain and tenderness in your muscles. Pain is sometimes described as stabbing, shooting, or burning. You may also have: Tingling or numbness. Sleep problems and fatigue. Problems with attention and concentration (fibro fog). Other symptoms may include: Bowel and bladder problems. Headaches. Vision problems. Sensitivity to odors and noises. Depression or mood changes. Painful menstrual periods (dysmenorrhea). Dry skin or eyes. These symptoms can vary over time. Myofascial pain syndrome Symptoms of myofascial pain syndrome include: Tight, ropy bands of muscle. Uncomfortable sensations in muscle areas. These may include aching, cramping, burning, numbness, tingling, and  weakness. Difficulty moving certain parts of the body freely (poor range of motion). How is this diagnosed? This condition may be diagnosed by your symptoms and medical history. You will also have a physical exam. In general: Myofascial pain syndrome is diagnosed if you have trigger points in your muscles, and those trigger points are tender and cause pain elsewhere in your body (referred pain). How is this treated? Treatment for these conditions depends on the type that you have. Treatment for myofascial pain syndrome includes: Pain medicines, such as NSAIDs. Cooling and stretching of muscles. Massage therapy with myofascial release technique. Trigger point injections. Treating these conditions often requires a team of health care providers. These may include: Your primary care provider. A physical therapist. Complementary health care providers, such as massage therapists or acupuncturists. A psychiatrist for cognitive behavioral therapy. Follow these instructions at home: Medicines Take over-the-counter and prescription medicines only as told by your health care provider. Ask your health care provider if the medicine prescribed to you: Requires you to avoid driving or using machinery. Can cause constipation. You may need to take these actions to prevent or treat constipation: Drink enough fluid to keep your urine pale yellow. Take over-the-counter or prescription medicines. Eat foods that are high in fiber, such as beans, whole grains, and fresh fruits and vegetables. Limit foods that are high in fat and processed sugars, such as fried or sweet foods. Lifestyle  Do exercises as told by your health care provider or physical therapist. Practice relaxation techniques to control your stress. You may want to try: Biofeedback. Visual imagery. Hypnosis. Muscle relaxation. Yoga. Meditation. Maintain a healthy lifestyle. This includes eating a healthy diet and getting enough sleep. Do  not use any products that contain nicotine or tobacco. These products include cigarettes, chewing tobacco, and vaping devices, such  as e-cigarettes. If you need help quitting, ask your health care provider. General instructions Talk to your health care provider about complementary treatments, such as acupuncture or massage. Do not do activities that stress or strain your muscles. This includes repetitive motions and heavy lifting. Keep all follow-up visits. This is important. Where to find support Consider joining a support group with others who are diagnosed with this condition. National Fibromyalgia Association: fmaware.org Where to find more information U.S. Pain Foundation: uspainfoundation.org Contact a health care provider if: You have new symptoms. Your symptoms get worse or your pain is severe. You have side effects from your medicines. You have trouble sleeping. Your condition is causing depression or anxiety. Get help right away if: You have thoughts of hurting yourself or others. Get help right away if you feel like you may hurt yourself or others, or have thoughts about taking your own life. Go to your nearest emergency room or: Call 911. Call the National Suicide Prevention Lifeline at 959-492-7399 or 988. This is open 24 hours a day. Text the Crisis Text Line at (938) 073-9368. This information is not intended to replace advice given to you by your health care provider. Make sure you discuss any questions you have with your health care provider. Document Revised: 03/18/2022 Document Reviewed: 05/11/2021 Elsevier Patient Education  2024 [employer].

## 2023-05-12 NOTE — Progress Notes (Signed)
Office Visit Note  Patient: Cheryl Wright             Date of Birth: Nov 03, 1975           MRN: [id number]             PCP: [facility] Referring: Sherian Rein, * Visit Date: 05/12/2023   Subjective:  Follow-up (Patient states she has a rash on her legs. Patient states her fingers have had swelling and itching feeling. Patient states sometimes she gets a rash in her mouth. Patient states her joint pain has increased in the past three months. )   Discussed the use of AI scribe software for clinical note transcription with the patient, who gave verbal consent to proceed.  History of Present Illness   Cheryl Wright is a 47 y.o. female with a history of long COVID, presents with persistent fatigue, joint pain, and recurrent infections. They report a significant decrease in physical activity tolerance, with fatigue persisting despite recovery from acute COVID-19 symptoms. The patient has been attending pulmonary rehab until July, which was beneficial, but recurrent illnesses have disrupted this regimen. They report experiencing three rounds of illness requiring steroid treatment, which significantly impacted their ability to exercise and overall energy levels.  The patient also reports worsening joint pain over the last three months, particularly in larger joints. They describe morning stiffness, aching at night, and tenderness to touch. The patient also reports generalized hair loss.  In addition to these symptoms, the patient has been experiencing recurrent rashes and hives, particularly on the legs and trunk. These rashes are often accompanied by itching, particularly around the joints. The patient has also noticed welting on their fingers. These symptoms have been managed with Benadryl, but the frequency of these episodes has increased recently. She thinks the benadryl helps but also puts her to sleep so might be sleeping them off.  They also report  increased sensitivity to sun exposure, with skin flushing and swelling in the fingers and feet.  Fatigue remains very disruptive for her day-to-day life typically spends 1 or both days of the weekend recovering if she exerts herself normally during the week.   Previous HPI 11/20/22 Cheryl Wright is a 47 y.o. female here for evaluation of joint pain in multiple areas also persistent fatigue symptoms have been ongoing since January.  Before that she was sick with COVID in September and again in December of last year never had complete resolution of symptoms during the interval.  After getting sick in December had associated productive cough and sharp chest pain this is treated with antibiotics and inhaler treatment.  Also complicated by subsequent influenza B infection found on testing January 25.  Due to persistent respiratory symptoms and abnormal imaging findings she saw Dr. Everardo All in pulmonology clinic.  She had pulmonary function testing and started treatment on inhaler therapy and started working with pulmonary rehab.  She is also had a profound level of fatigue with limited ability to get up or perform activities on some days.  Prior to last year she was very physically active including running and biking and had never experienced similar symptoms.  Over the past 4 months symptoms have partially improved still has days where she tolerates almost 0 physical activity but on other days is able to perform exercises fairly well. Also had some increase in some joint pain in multiple areas she was taking Tylenol for this.  This has also improved and discontinued taking  Tylenol for the past few weeks without severe symptom worsening.  Did not notice any visible joint swelling no warmth or discoloration.  Does not have prolonged morning stiffness. She has family history of autoimmune disease father has rheumatoid arthritis and her brother has Crohn's disease.   Review of Systems  Constitutional:   Positive for fatigue.  HENT:  Positive for mouth sores. Negative for mouth dryness.   Eyes:  Positive for dryness.  Respiratory:  Positive for shortness of breath.   Cardiovascular:  Positive for chest pain and palpitations.  Gastrointestinal:  Positive for constipation and diarrhea. Negative for blood in stool.  Endocrine: Negative for increased urination.  Genitourinary:  Negative for involuntary urination.  Musculoskeletal:  Positive for joint pain, gait problem, joint pain, joint swelling, myalgias, muscle weakness, morning stiffness, muscle tenderness and myalgias.  Skin:  Positive for rash and sensitivity to sunlight. Negative for color change and hair loss.  Allergic/Immunologic: Positive for susceptible to infections.  Neurological:  Positive for dizziness and headaches.  Hematological:  Positive for swollen glands.  Psychiatric/Behavioral:  Positive for sleep disturbance. Negative for depressed mood. The patient is nervous/anxious.     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   SVD (spontaneous vaginal delivery) 06/02/2012   Normal pregnancy 06/01/2012    Past Medical History:  Diagnosis Date   Blood transfusion without reported diagnosis    pp hemorrhage 2011 twin delivery   Endometriosis    stage four   Frequent UTI    Headache(784.0)    migraine   Hx of preeclampsia, prior pregnancy, currently pregnant    Long COVID    Patient states caused lung damage   Normal pregnancy 06/01/2012   SVD (spontaneous vaginal delivery) 06/02/2012    Family History  Problem Relation Age of Onset   Hyperlipidemia Mother    Hypertension Mother    Osteoporosis Mother    Osteoarthritis Mother    Rheum arthritis Father    Lung cancer Father        mesothelioma from [employer]   Stroke Maternal  Grandmother    Breast cancer Paternal Grandmother 21   Colon cancer Paternal Grandmother 65   Lung cancer Paternal Grandfather        dx in his 29s; smoker   Ovarian cancer Other        paternal grandfather's 2 sisters and mother in their 101s-60s   Breast cancer Other        father's paternal first cousin dx in her 53s   Breast cancer Other        paternal grandmother's mother dx in her 26s   Past Surgical History:  Procedure Laterality Date   ANKLE SURGERY     BLADDER SURGERY     dilation   LAPAROSCOPY     LYSIS OF ADHESION     OVARIAN CYST REMOVAL     removed two   Social History   Social History Narrative   Lives with husband and 3 kids.    Immunization History  Administered Date(s) Administered   Influenza Split 03/22/2014   Influenza,inj,Quad PF,6+ Mos 05/23/2021, 05/24/2022   PFIZER(Purple Top)SARS-COV-2 Vaccination 09/10/2019, 10/05/2019, 05/25/2020   Pfizer Covid-19 Vaccine Bivalent Booster 48yrs & up 07/14/2021   Tdap 03/26/2012, 05/24/2022     Objective: Vital Signs: BP 93/66 (  BP Location: Left Arm, Patient Position: Sitting, Cuff Size: Normal)   Pulse 66   Resp 14   Ht 5\' 9"  (1.753 m)   Wt 162 lb (73.5 kg)   LMP 04/11/2023   BMI 23.92 kg/m    Physical Exam Eyes:     Conjunctiva/sclera: Conjunctivae normal.  Cardiovascular:     Rate and Rhythm: Normal rate and regular rhythm.  Pulmonary:     Effort: Pulmonary effort is normal.     Breath sounds: Normal breath sounds.  Musculoskeletal:     Right lower leg: No edema.     Left lower leg: No edema.  Lymphadenopathy:     Cervical: No cervical adenopathy.  Skin:    General: Skin is warm and dry.     Findings: Rash present.     Comments: Facial erythema, scattered telangiectasias no papules Faint skin rash on medial side of both knees No digital pitting or nail changes  Neurological:     Mental Status: She is alert.  Psychiatric:        Mood and Affect: Mood normal.      Musculoskeletal Exam:   Shoulders full ROM no tenderness or swelling Elbows full ROM no tenderness or swelling Wrists full ROM no tenderness or swelling Fingers full ROM no tenderness or swelling Lateral hip tenderness to pressure on both sides, normal internal/external rotation range of motion Knees full ROM no tenderness or swelling   Investigation: No additional findings.  Imaging: No results found.  Recent Labs: Lab Results  Component Value Date   WBC 10.6 (H) 07/29/2022   HGB 14.3 07/29/2022   PLT 289 07/29/2022   NA 137 07/29/2022   K 3.9 07/29/2022   CL 103 07/29/2022   CO2 23 07/29/2022   GLUCOSE 120 (H) 07/29/2022   BUN 15 07/29/2022   CREATININE 0.73 07/29/2022   CALCIUM 10.0 07/29/2022    Speciality Comments: No specialty comments available.  Procedures:  No procedures performed Allergies: Penicillins   Assessment / Plan:     Visit Diagnoses: Positive ANA (antinuclear antibody) - Plan: RNP Antibody, Anti-scleroderma antibody, Anti-Smith antibody, Sjogrens syndrome-A extractable nuclear antibody, Sjogrens syndrome-B extractable nuclear antibody, Anti-DNA antibody, double-stranded, C3 and C4, Sedimentation rate  They exhibit persistent fatigue, decreased exercise tolerance, and recurrent infections since their COVID-19 infection. Despite attending pulmonary rehab until July and noting improvement, their progress has been hindered by recurrent illnesses. We will order more specific antibody markers and consider a trial of medication such as hydroxychloroquine if there is a moderate or high positive result on antibody tests, even in the absence of full lupus criteria.  History of frequent URI - Plan: IgG, IgA, IgM  We will continue pulmonary rehab as tolerated and consider a referral to Allergy/Immunology for an evaluation of recurrent infections and potential environmental sensitivities.  Myofascial Pain They report widespread muscle and joint pain, particularly in large joints and  the back, which worsens in the morning and after periods of inactivity. Tenderness was noted on physical exam. We will review online resources for non-pharmacological management strategies, specifically the [facility] Department of Rheumatology resource for fibromyalgia.  Lung Nodules Nodules were noted on a recent CT scan, with a follow-up with a pulmonologist planned. They should keep the appointment with the pulmonologist to discuss the potential need for bronchoscopy.  Hives They experience recurrent hives, possibly related to food or environmental sensitivities. We will consider a referral to Allergy/Immunology for further evaluation.   Orders: Orders Placed This Encounter  Procedures   RNP Antibody   Anti-scleroderma antibody   Anti-Smith antibody   Sjogrens syndrome-A extractable nuclear antibody   Sjogrens syndrome-B extractable nuclear antibody   Anti-DNA antibody, double-stranded   C3 and C4   Sedimentation rate   IgG, IgA, IgM   No orders of the defined types were placed in this encounter.    Follow-Up Instructions: No follow-ups on file.   Fuller Plan, MD  Note - This record has been created using [employer].  Chart creation errors have been sought, but may not always  have been located. Such creation errors do not reflect on  the standard of medical care.

## 2023-05-13 LAB — ANTI-SCLERODERMA ANTIBODY: Scleroderma (Scl-70) (ENA) Antibody, IgG: <1 AI

## 2023-05-13 LAB — IGG, IGA, IGM
IgG (Immunoglobin G), Serum: 961 mg/dL (ref 600–1640)
IgM, Serum: 92 mg/dL (ref 50–300)
Immunoglobulin A: 85 mg/dL (ref 47–310)

## 2023-05-13 LAB — C3 AND C4
C3 Complement: 134 mg/dL (ref 83–193)
C4 Complement: 25 mg/dL (ref 15–57)

## 2023-05-13 LAB — SEDIMENTATION RATE: Sed Rate: 2 mm/h (ref 0–20)

## 2023-05-13 LAB — RNP ANTIBODY: Ribonucleic Protein(ENA) Antibody, IgG: <1 AI

## 2023-05-13 LAB — ANTI-SMITH ANTIBODY: ENA SM Ab Ser-aCnc: <1 AI

## 2023-05-13 LAB — SJOGRENS SYNDROME-B EXTRACTABLE NUCLEAR ANTIBODY: SSB (La) (ENA) Antibody, IgG: <1 AI

## 2023-05-13 LAB — SJOGRENS SYNDROME-A EXTRACTABLE NUCLEAR ANTIBODY: SSA (Ro) (ENA) Antibody, IgG: <1 AI

## 2023-05-13 LAB — ANTI-DNA ANTIBODY, DOUBLE-STRANDED: ds DNA Ab: <1 [IU]/mL

## 2023-05-14 ENCOUNTER — Other Ambulatory Visit: Payer: Self-pay | Admitting: *Deleted

## 2023-05-14 DIAGNOSIS — L509 Urticaria, unspecified: Secondary | ICD-10-CM

## 2023-05-14 NOTE — Progress Notes (Signed)
Lab results are all negative which is good. I she see allergy/immunology to evaluate symptoms of hives and recurrent infections and we can refer her for this.

## 2023-05-21 ENCOUNTER — Ambulatory Visit (HOSPITAL_BASED_OUTPATIENT_CLINIC_OR_DEPARTMENT_OTHER): Payer: BC Managed Care – PPO | Admitting: Pulmonary Disease

## 2023-05-21 ENCOUNTER — Encounter (HOSPITAL_BASED_OUTPATIENT_CLINIC_OR_DEPARTMENT_OTHER): Payer: Self-pay | Admitting: Pulmonary Disease

## 2023-05-21 VITALS — BP 90/72 | HR 69 | Resp 16 | Ht 69.0 in | Wt 162.0 lb

## 2023-05-21 DIAGNOSIS — A310 Pulmonary mycobacterial infection: Secondary | ICD-10-CM

## 2023-05-21 DIAGNOSIS — Z8616 Personal history of COVID-19: Secondary | ICD-10-CM | POA: Diagnosis not present

## 2023-05-21 MED ORDER — AIRSUPRA 90-80 MCG/ACT IN AERO: 2.0000 | INHALATION_SPRAY | RESPIRATORY_TRACT | 5 refills | Status: AC | PRN

## 2023-05-21 MED ORDER — BUDESONIDE-FORMOTEROL FUMARATE 160-4.5 MCG/ACT IN AERO: 2.0000 | INHALATION_SPRAY | Freq: Two times a day (BID) | RESPIRATORY_TRACT | 11 refills | Status: AC

## 2023-05-21 NOTE — Progress Notes (Signed)
Subjective:   PATIENT ID: Cheryl Wright GENDER: female DOB: 05/21/1976, MRN: [id number]  Chief Complaint  Patient presents with   Follow-up    3 month FU after CT.     Reason for Visit: Follow-up  Ms. Cheryl Wright is a 47 year old female former smoker with endometriosis, migraine, seasonal allergies and GAD who presents for follow-up for covid long hauler.  Initial consult She has had covid in September 2023 and again in December 2023. Her covid symptoms were improving until end of December when she began having more productive cough and left pleuritic chest pain. She was treated with Septra and albuterol inhaler 07/10/22 and improved cough but has worsening shortness of breath and congestion. Tested positive for flu B on 07/18/22. Stopped taking Septra after taking 7 days. Took tamiflu and completed it. And took levaquin when CXR showed possible pneumonia  She had a CT Chest on 07/30/22 with impression: 1. Reticulonodular and tree in bud type opacities in right middle lobe suspected be infectious or inflammatory in etiology. Consider atypical etiologies including MAI. 2. Several small lung nodules as detailed, 5 mm and less. No follow-up needed if patient is low risk. CXR report on 07/18/21 with interstitial changes at the bases however resolved on follow-up CXR 07/29/22.  She reports her nasal congestion and productive cough that worsened in the beginning of January. Went on a ski trip and felt sick during the trip. Worsening shortness of breath. After finishing her antibiotics she is laying in bed and feeling fatigued. She was evaluated in ED on 2/5. Treated with IV fluids, steroids. Has completed steroids but she feels it was not effective.  At baseline she reports she does have wheezing and cough when exposed to outdoor allergens. Uses saline nasal solution. Associated with chest tightness. Now she feels like she has chronic symptoms. Denies childhood asthma. Albuterol initially helped  but does not feel like albuterol inhaler anymore due to head spinning. Cough is less productive. Has nasal congestion. Has wheezing/whistle with laying down.  09/17/22 Since our last visit she has started pulmonary rehab. Has received one round steroids ordered on 08/16/22. No further steroids. She reports shortness of breath has improved. Still intermittently feeling fatigued, shortness of breath and chest pain. Albuterol use is variable. At least 1-2 times daily. Husband presents chart of her chronic chest pain, shortness of breath and fatigue which he reports can be subjective but notes she is overall feeling better.  During exercise at rehab she has had systolic BPs in the upper 80s.associated with dizziness. She reports good hydration and currently normotensive in clinic today.  11/15/22 She has completed pulmonary rehab and planning to start PREP program in June. She felt the program was beneficial. Her energy has improved but sometimes feel fatigued after exercising. Improved shortness of breath overall but still difficulty with increased humidity. Recent congestion in the last week. Some nasal drainage with dry cough. Occasionally uses [place].  02/18/23 Since our last visit she took prednisone course in June and July for cough and wheezing. Still coughing at night. Reports left chest pain for several months that hasn't changed. Denies recent fevers and chills. Has been off nasal sprays for some time and restarted this. She reports feeling more congested. Has been compliant with Symbicort and airsupura 2-3 times a week before exercise. Exercising regularly.  05/21/23 Since our last visit her end of summer symptoms improved but worsened in the last few weeks due to suspected allergies. Cough seems to  occur in in the morning and evenings. Has been compliant with Symbicort except when she went to her Rheumatologist and held the inhaler. Her shortness of breath worsened around this time. Uses air  supra once a day with exercise. Denies wheezing.  05/26/23 Since our last visit she reports no changes to her respiratory symptoms and well controlled on Symbicort. When previously off inhaler she has worsening shortness of breath. No cough or wheezing. Uses rescue inhaler prior to exercise. She is planned to see Allergy in January for rashes. Presents to discuss CT results.  Social History: Social smoker <10 years weekly. <1/2 ppd. Quit in 2202 Second hand smoke exposure  Past Medical History:  Diagnosis Date   Blood transfusion without reported diagnosis    pp hemorrhage 2011 twin delivery   Endometriosis    stage four   Frequent UTI    Headache(784.0)    migraine   Hx of preeclampsia, prior pregnancy, currently pregnant    Long COVID    Patient states caused lung damage   Normal pregnancy 06/01/2012   SVD (spontaneous vaginal delivery) 06/02/2012     Family History  Problem Relation Age of Onset   Hyperlipidemia Mother    Hypertension Mother    Osteoporosis Mother    Osteoarthritis Mother    Rheum arthritis Father    Lung cancer Father        mesothelioma from [employer]   Stroke Maternal Grandmother    Breast cancer Paternal Grandmother 59   Colon cancer Paternal Grandmother 62   Lung cancer Paternal Grandfather        dx in his 44s; smoker   Ovarian cancer Other        paternal grandfather's 2 sisters and mother in their 56s-60s   Breast cancer Other        father's paternal first cousin dx in her 109s   Breast cancer Other        paternal grandmother's mother dx in her 34s     Social History   Occupational History   Not on file  Tobacco Use   Smoking status: Former    Types: Cigarettes    Passive exposure: Past   Smokeless tobacco: Never  Vaping Use   Vaping status: Never Used  Substance and Sexual Activity   Alcohol use: Yes    Comment: rarely   Drug use: No   Sexual activity: Yes    Comment: preg    Allergies  Allergen Reactions   Penicillins  Anaphylaxis, Hives and Swelling    Childhood reaction     Outpatient Medications Prior to Visit  Medication Sig Dispense Refill   Acetaminophen (TYLENOL PO) Take by mouth.     albuterol (PROVENTIL) (2.5 MG/3ML) 0.083% nebulizer solution Take 3 mLs (2.5 mg total) by nebulization every 6 (six) hours as needed for wheezing or shortness of breath. 75 mL 2   Albuterol-Budesonide (AIRSUPRA) 90-80 MCG/ACT AERO Inhale 2 puffs into the lungs every 4 (four) hours as needed. 10.7 g 5   budesonide-formoterol (SYMBICORT) 160-4.5 MCG/ACT inhaler Inhale 2 puffs into the lungs in the morning and at bedtime. 10.2 each 3   loratadine (CLARITIN) 10 MG tablet Take 10 mg by mouth daily.     norgestimate-ethinyl estradiol (ORTHO-CYCLEN) 0.25-35 MG-MCG tablet Take 1 tablet by mouth daily.     sertraline (ZOLOFT) 50 MG tablet Take 50 mg by mouth daily.     ibuprofen (ADVIL,MOTRIN) 800 MG tablet Take 1 tablet (800 mg total) by mouth every  8 (eight) hours as needed for pain. 40 tablet 1   chlorpheniramine-HYDROcodone (TUSSIONEX) 10-8 MG/5ML Take 5 mLs by mouth at bedtime as needed for cough. (Patient not taking: Reported on 05/21/2023) 115 mL 0   No facility-administered medications prior to visit.    Review of Systems  Constitutional:  Negative for chills, diaphoresis, fever, malaise/fatigue and weight loss.  HENT:  Negative for congestion.   Respiratory:  Negative for cough, hemoptysis, sputum production, shortness of breath and wheezing.   Cardiovascular:  Negative for chest pain, palpitations and leg swelling.     Objective:   Vitals:   05/21/23 1031  BP: 90/72  Pulse: 69  Resp: 16  SpO2: 97%  Weight: 162 lb (73.5 kg)  Height: 5\' 9"  (1.753 m)   SpO2: 97 %  Physical Exam: General: Well-appearing, no acute distress HENT: [place], AT Eyes: EOMI, no scleral icterus Respiratory: Clear to auscultation bilaterally.  No crackles, wheezing or rales Cardiovascular: RRR, -M/R/G, no  JVD Extremities:-Edema,-tenderness Neuro: AAO x4, CNII-XII grossly intact Psych: Normal mood, normal affect  Data Reviewed:  Imaging: CT Chest 07/30/22 1. Reticulonodular and tree-in-bud type opacities noted in the right  middle lobe suspected to be infectious or inflammatory in etiology.  Consider atypical etiologies including MAI.  2. Several small lung nodules as detailed, 5 mm and less. No  follow-up needed if patient is low-risk (and has no known or  suspected primary neoplasm).   CT Chest 02/03/23 - RML with reticulonodular and tree in bud opacities  CT Chest 05/12/23 - Minimal with minimal scattered mucoid impaction that is unchanged. No pulmonary nodules.  PFT: 09/17/22 FVC 4.43 (107%) FEV1 3.19 (97%) Ratio 72  TLC 124% DLCO 98% Interpretation: Normal spirometry. Mild hyperinflation and air trapping. Normal gas exchange.  Labs: CBC    Component Value Date/Time   WBC 10.6 (H) 07/29/2022 1425   RBC 4.26 07/29/2022 1425   HGB 14.3 07/29/2022 1425   HCT 41.3 07/29/2022 1425   PLT 289 07/29/2022 1425   MCV 96.9 07/29/2022 1425   MCH 33.6 07/29/2022 1425   MCHC 34.6 07/29/2022 1425   RDW 12.2 07/29/2022 1425   Abs eos 07/18/22 100     Assessment & Plan:   Discussion: 47 year old female former smoker with endometriosis, migraine, seasonal allergies and GAD who presents for follow-up for covid long hauler. Overall improving stamina and symptoms after graduating pulmonary rehab and well controlled on Symbicort. Reviewed CT 04/2023 with unchanged nodularity and mucoid impaction in RML. Discussed low yield on bronchoscopy based on current minimal findings on CT but offered as an option in future in case symptoms worsen.  MAI/Atypical infection on CT Clinically low suspicion for this.  --CT reviewed with possible atypical infection however unlikely active and currently stable and minimal symptoms --Discussed bronchoscopy. Not warranted at this time. --Would recommend 6-12  month range. ORDER CT Chest without contrast in 1 year.  If abnormalities persist/worsen will arrange sooner scan  Post-covid long hauler  Chronic bronchitis --CONTINUE Symbicort 160-4.5 mcg TWO puffs in the morning and evening. Rinse mouth out after use to prevent thrush. REFILL --CONTINUE Airsupra 1-2 puffs AS NEEDED every 4 hours. REFILL --CONTINUE regular aerobic exercise  --Following [facility] Covid program  Hx recurrent infections Rash --Scheduled to see Allergy in January 2025  Health Maintenance Immunization History  Administered Date(s) Administered   Influenza Split 03/22/2014   Influenza,inj,Quad PF,6+ Mos 05/23/2021, 05/24/2022   PFIZER(Purple Top)SARS-COV-2 Vaccination 09/10/2019, 10/05/2019, 05/25/2020   Pfizer Covid-19 Vaccine Bivalent  Booster 75yrs & up 07/14/2021   Pfizer(Comirnaty)Fall Seasonal Vaccine 12 years and older 05/08/2023   Tdap 03/26/2012, 05/24/2022   CT Lung Screen - not qualifed  Orders Placed This Encounter  Procedures   CT Chest Wo Contrast    Standing Status:   Future    Standing Expiration Date:   05/20/2024    Scheduling Instructions:     Schedule in 1 year (04/2024)    Order Specific Question:   Is patient pregnant?    Answer:   No    Order Specific Question:   Preferred imaging location?    Answer:   [facility]   Meds ordered this encounter  Medications   Albuterol-Budesonide (AIRSUPRA) 90-80 MCG/ACT AERO    Sig: Inhale 2 puffs into the lungs every 4 (four) hours as needed.    Dispense:  10.7 g    Refill:  5   budesonide-formoterol (SYMBICORT) 160-4.5 MCG/ACT inhaler    Sig: Inhale 2 puffs into the lungs in the morning and at bedtime.    Dispense:  10.2 each    Refill:  11    Return in about 1 year (around 05/20/2024) for after CT scan.   I have spent a total time of 32-minutes on the day of the appointment including chart review, data review, collecting history, coordinating care and discussing medical diagnosis and plan  with the patient/family. Past medical history, allergies, medications were reviewed. Pertinent imaging, labs and tests included in this note have been reviewed and interpreted independently by me.  Devine Dant Mechele Collin, MD [facility] 05/21/2023 10:38 AM  Office Number [phone]

## 2023-05-21 NOTE — Patient Instructions (Signed)
MAI/Atypical infection on CT Clinically low suspicion for this.  --CT reviewed with possible atypical infection however unlikely active and currently stable and minimal symptoms --Discussed bronchoscopy. Not warranted at this time. --Would recommend 6-12 month range. ORDER CT Chest without contrast in 1 year.  If abnormalities persist/worsen will arrange sooner scan  Post-covid long hauler  Chronic bronchitis --CONTINUE Symbicort 160-4.5 mcg TWO puffs in the morning and evening. Rinse mouth out after use to prevent thrush. REFILL --CONTINUE Airsupra 1-2 puffs AS NEEDED every 4 hours. REFILL --CONTINUE regular aerobic exercise  --Following [facility] Covid program  Hx recurrent infections Rash --Scheduled to see Allergy in January 2025

## 2023-07-01 ENCOUNTER — Ambulatory Visit (INDEPENDENT_AMBULATORY_CARE_PROVIDER_SITE_OTHER): Payer: 59 | Admitting: Allergy & Immunology

## 2023-07-01 ENCOUNTER — Encounter: Payer: Self-pay | Admitting: Allergy & Immunology

## 2023-07-01 ENCOUNTER — Other Ambulatory Visit: Payer: Self-pay

## 2023-07-01 VITALS — BP 110/78 | HR 91 | Temp 98.0°F | Resp 20 | Ht 69.0 in | Wt 162.0 lb

## 2023-07-01 DIAGNOSIS — J4540 Moderate persistent asthma, uncomplicated: Secondary | ICD-10-CM

## 2023-07-01 DIAGNOSIS — B999 Unspecified infectious disease: Secondary | ICD-10-CM | POA: Diagnosis not present

## 2023-07-01 DIAGNOSIS — J310 Chronic rhinitis: Secondary | ICD-10-CM | POA: Diagnosis not present

## 2023-07-01 MED ORDER — TRIAMCINOLONE ACETONIDE 0.1 % EX OINT: 1.0000 | TOPICAL_OINTMENT | Freq: Two times a day (BID) | CUTANEOUS | 5 refills | Status: AC

## 2023-07-01 NOTE — Progress Notes (Signed)
 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 Chronic rhinitis - Because of insurance stipulations, we cannot do skin testing on the same day as your first visit. - We are all working to fight this, but for now we need to do two separate visits.  - We will know more after we do testing at the next visit.  - The skin testing visit can be squeezed in at your convenience.  - Then we can make a more full plan to address all of your symptoms. - Be sure to stop your antihistamines for 3 days before this appointment.   2. Recurrent infections - We will obtain some screening labs to evaluate your immune system.  - Labs to evaluate the quantitative [facility]) aspects of your immune system: IgG/IgA/IgM, CBC with differential - Labs to evaluate the qualitative (HOW WELL THEY WORK) aspects of your immune system: CH50, Pneumococcal titers, Tetanus titers, Diphtheria titers - We may consider immunizations with Pneumovax and Tdap to challenge your immune system, and then obtain repeat titers in 4-6 weeks.  - I am not sure what is going on with your immune system, but this workup will help answer some questions.   3. Moderate persistent asthma, uncomplicated - Lung testing a little lower, but it did improve with the albuterol  treatment. - I am not going to make any changes today. - Daily controller medication(s): Symbicort  160/4.40mcg two puffs twice daily with spacer - Prior to physical activity: AirSupra  2 puffs 10-15 minutes before physical activity. - Rescue medications: AirSupra  4 puffs every 4-6 hours as needed - Asthma control goals:  * Full participation in all desired activities  (may need albuterol  before activity) * Albuterol  use two time or less a week on average (not counting use with activity) * Cough interfering with sleep two time or less a month * Oral steroids no more than once a year * No hospitalizations  4. Return in about 1 week (around 07/08/2023) for SKIN TESTING. You can have the follow up appointment with Dr. Iva or a Nurse Practicioner (our Nurse Practitioners are excellent and always have Physician oversight!).    This note in its entirety was forwarded to the Provider who requested this consultation.  Subjective:   Cheryl Wright is a 48 y.o. female presenting today for evaluation of  Chief Complaint  Patient presents with   Establish Care    Experiences itchy throat and eyes. Pt states severely allergic to dog. Has experience Shortness of breath. Has rash occasionally come up on trunk more recently. Joint pain daily.   Urticaria   Allergies    Have gotten worse since having COVID x3, weakened immune system    Cheryl Wright has a history of the following: Patient Active Problem List   Diagnosis Date Noted   Positive ANA (antinuclear antibody) 05/12/2023   Hives 05/12/2023   Abnormal CT scan of lung 05/12/2023   Polyarthralgia 11/20/2022   COVID-19 long hauler 08/02/2022   SVD (spontaneous vaginal delivery) 06/02/2012   Normal pregnancy 06/01/2012    History obtained from: chart review and patient.  Discussed the use of AI scribe software for clinical note transcription with the patient and/or  guardian, who gave verbal consent to proceed.  Cheryl Wright was referred by [facility].     Cheryl Wright is a 48 y.o. female presenting for an evaluation of multiple atopic complaints .  Discussed the use of AI scribe software for clinical note transcription with the patient, who gave verbal consent to proceed.  History of Present Illness   The patient, previously diagnosed with long COVID,  presents with concerns of potential allergy -induced asthma.   She reports a history of multiple COVID-19 infections, with three episodes in the past two years, each followed by prolonged recovery periods. The patient also mentions a diagnosis of bronchiectasis and describes a history of recurrent bronchitis.   Asthma/Respiratory Symptom History: Cheryl Wright has been experiencing a productive cough intermittently over the past few weeks, which she attributes to increased nasal congestion. She also reports occasional rashes in the mouth and on the trunk, along with an itchy throat, which occur randomly and are not associated with any known triggers. She is on Symbicort  two puffs BID and AirSupra .  This combination seems to be working well for her. She tends to not use it when she is sick. She reports waking up in the middle of the night with coughing and difficulty breathing, but is hesitant to use AirSupra  due to its side effect of causing restlessness.  The patient has had multiple CT scans, the most recent of which showed some improvement. However, she still has small patches in her lungs, which are being monitored with regular CT scans. Dr. Kassie has expressed concern about the patient's immune system, per the patient, and the potential for these lung pockets to become infected.  Usually she has a dry hacking cough.  It has been more productive over the last few weeks.   Her next CT scan is the summer 2025. Her last one was in November 2025 showed the following: 1. Minimal scattered mucoid impaction and peribronchovascular nodularity, predominating in the right middle lobe, as before. Findings are likely postinfectious in etiology. Chronic mycobacterium avium complex is considered unlikely given the mild nature of the findings and patient age. 2. No suspicious pulmonary nodules. 3. Hazy lymph nodes in the right axilla, not considered enlarged by CT size criteria but larger than on 02/03/2023. A reactive  etiology is favored.  At her last visit with Dr. Kassie, she felt that she had a very low suspicion for MAI.  We did discuss doing a bronchoscopy, but she did not feel it was warranted at that time.  She recommended doing a chest CT in 1 year to see if the findings are improving.  She is continue on Symbicort  2 puffs twice daily and her rescue inhaler.  She continued to follow with [facility] in the long COVID clinic.  Allergic Rhinitis Symptom History: She had seasonal allergies. She has not been tested in the past. Typically she takes Claritin which does something, but does not alleviate the symptoms completely.  She does use Benadryl  which does provide some relief and helps with her nasal symptoms. She does report that she has an itchy throat more days than not, on a rather random.   She did not have the problems with infections prior to her multiple COVID-19 infections.  Prior to this, she was doing fairly well.  Food Allergy  Symptom History: She has developed gluten intolerance since early 2023, which was after a COVID-19 infection. The patient has not been able to tolerate gluten since then.  She also has noticed  a link to tomato and strawberry.  Skin Symptom History: She has been experiencing an intermittent rash that appears to be linked to dietary or contact triggers. The rash, which is primarily located on the trunk and around the joints of the fingers, is described as red, itchy, and occasionally associated with swelling. The rash also appears on larger joints such as the knees, although this has not been recently observed. Cheryl Wright noticed a correlation between the rash and consumption of strawberries or tomatoes, but this association has become less consistent. Despite reducing intake of these foods, the rash continues to occur. The patient has been managing the symptoms with over-the-counter treatments and Benadryl , which helps to reduce the severity of the rash but does not completely eliminate it.   She does not have any pictures of the rash.   The patient also mentions a recent injury from a fall down some stairs during a family ski trip.  They were up at [facility] and she injured her foot by falling down some stairs at the [employer].  She is in a boot right now and they are hoping that it will start to heal without the need for surgery.  Otherwise, there is no history of other atopic diseases, including drug allergies, stinging insect allergies, or contact dermatitis. There is no significant infectious history. Vaccinations are up to date.    Past Medical History: Patient Active Problem List   Diagnosis Date Noted   Positive ANA (antinuclear antibody) 05/12/2023   Hives 05/12/2023   Abnormal CT scan of lung 05/12/2023   Polyarthralgia 11/20/2022   COVID-19 long hauler 08/02/2022   SVD (spontaneous vaginal delivery) 06/02/2012   Normal pregnancy 06/01/2012    Medication List:  Allergies as of 07/01/2023       Reactions   Penicillins Anaphylaxis, Hives, Swelling   Childhood reaction        Medication List        Accurate as of July 01, 2023 12:03 PM. If you have any questions, ask your nurse or doctor.          Airsupra  90-80 MCG/ACT Aero Generic drug: Albuterol -Budesonide  Inhale 2 puffs into the lungs every 4 (four) hours as needed.   albuterol  (2.5 MG/3ML) 0.083% nebulizer solution Commonly known as: PROVENTIL  Take 3 mLs (2.5 mg total) by nebulization every 6 (six) hours as needed for wheezing or shortness of breath.   budesonide -formoterol  160-4.5 MCG/ACT inhaler Commonly known as: SYMBICORT  Inhale 2 puffs into the lungs in the morning and at bedtime.   chlorpheniramine-HYDROcodone  10-8 MG/5ML Commonly known as: TUSSIONEX Take 5 mLs by mouth at bedtime as needed for cough.   loratadine 10 MG tablet Commonly known as: CLARITIN Take 10 mg by mouth daily.   meloxicam 7.5 MG tablet Commonly known as: MOBIC Take 7.5 mg by mouth daily.    norgestimate-ethinyl estradiol 0.25-35 MG-MCG tablet Commonly known as: ORTHO-CYCLEN Take 1 tablet by mouth daily.   sertraline 50 MG tablet Commonly known as: ZOLOFT Take 50 mg by mouth daily.   TYLENOL  PO Take by mouth.   Vienva 0.1-20 MG-MCG tablet Generic drug: levonorgestrel-ethinyl estradiol Take 1 tablet by mouth daily.        Birth History: non-contributory  Developmental History: non-contributory  Past Surgical History: Past Surgical History:  Procedure Laterality Date   ANKLE SURGERY     BLADDER SURGERY     dilation   LAPAROSCOPY     LYSIS OF ADHESION     OVARIAN CYST REMOVAL  removed two     Family History: Family History  Problem Relation Age of Onset   Urticaria Mother    Asthma Mother    Angioedema Mother    Allergic rhinitis Mother    Hyperlipidemia Mother    Hypertension Mother    Osteoporosis Mother    Osteoarthritis Mother    Angioedema Father    Allergic rhinitis Father    Rheum arthritis Father    Lung cancer Father        mesothelioma from [employer]   Allergic rhinitis Brother    Stroke Maternal Grandmother    Allergic rhinitis Paternal Grandmother    Breast cancer Paternal Grandmother 36   Colon cancer Paternal Grandmother 62   Allergic rhinitis Paternal Grandfather    Lung cancer Paternal Grandfather        dx in his 56s; smoker   Ovarian cancer Other        paternal grandfather's 2 sisters and mother in their 11s-60s   Breast cancer Other        father's paternal first cousin dx in her 73s   Breast cancer Other        paternal grandmother's mother dx in her 39s     Social History: Cheryl Wright lives at home with her family.  Building a house that is 48 years old.  There is hardwood in the main living areas and carpeting in the bedroom.  She has electric heating and central cooling.  There is 1 [place] dog in the home.  There are no dust mite covers on the bedding.  There is no tobacco exposure.  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  She has a set of 46 year old twins and an 74 year old daughter.  There is no HEPA filter in the home.  There is no fume, chemical, or dust exposure.  [place] do not live near interstate or industrial area.  She was a smoker from 21 through 2002, but only socially.   Review of systems otherwise negative other than that mentioned in the HPI.    Objective:   Blood pressure 110/78, pulse 91, temperature 98 F (36.7 C), temperature source Temporal, resp. rate 20, height 5' 9 (1.753 m), weight 162 lb (73.5 kg), SpO2 95%. Body mass index is 23.92 kg/m.     Physical Exam Constitutional:      Appearance: She is well-developed.  HENT:     Head: Normocephalic and atraumatic.     Right Ear: Tympanic membrane, ear canal and external ear normal. No drainage, swelling or tenderness. Tympanic membrane is not injected, scarred, erythematous, retracted or bulging.     Left Ear: Tympanic membrane, ear canal and external ear normal. No drainage, swelling or tenderness. Tympanic membrane is not injected, scarred, erythematous, retracted or bulging.     Nose: No nasal deformity, septal deviation, mucosal edema or rhinorrhea.     Right Turbinates: Enlarged, swollen and pale.     Left Turbinates: Enlarged, swollen and pale.     Right Sinus: No maxillary sinus tenderness or frontal sinus tenderness.     Left Sinus: No maxillary sinus tenderness or frontal sinus tenderness.     Mouth/Throat:     Mouth: Mucous membranes are not pale and not dry.     Pharynx: Uvula midline.  Eyes:     General:        Right eye: No discharge.        Left eye:  No discharge.     Conjunctiva/sclera: Conjunctivae normal.     Right eye: Right conjunctiva is not injected. No chemosis.    Left eye: Left conjunctiva is not injected. No chemosis.    Pupils: Pupils are equal, round, and reactive to light.  Cardiovascular:     Rate and Rhythm: Normal rate and  regular rhythm.     Heart sounds: Normal heart sounds.  Pulmonary:     Effort: Pulmonary effort is normal. No tachypnea, accessory muscle usage or respiratory distress.     Breath sounds: Normal breath sounds. No wheezing, rhonchi or rales.  Chest:     Chest wall: No tenderness.  Abdominal:     Tenderness: There is no abdominal tenderness. There is no guarding or rebound.  Musculoskeletal:     Comments: Left leg in a boot.  Lymphadenopathy:     Head:     Right side of head: No submandibular, tonsillar or occipital adenopathy.     Left side of head: No submandibular, tonsillar or occipital adenopathy.     Cervical: No cervical adenopathy.  Skin:    Coloration: Skin is not pale.     Findings: No abrasion, erythema, petechiae or rash. Rash is not papular, urticarial or vesicular.  Neurological:     Mental Status: She is alert.      Diagnostic studies:    Spirometry: results abnormal (FEV1: 2.94/88%, FVC: 5.14/123%, FEV1/FVC: 57%).    Spirometry consistent with mild obstructive disease.  She did receive 4 puffs of Xopenex with mild improvements of her symptoms.  Her FEV1 did increase and her FVC did slightly decrease.  It did not completely normalize.  Allergy  Studies: deferred due to insurance stipulations that require a separate visit for testing  We did send immune workup.          Marty Shaggy, MD [facility] 

## 2023-07-01 NOTE — Patient Instructions (Addendum)
 1. Chronic rhinitis - Because of insurance stipulations, we cannot do skin testing on the same day as your first visit. - We are all working to fight this, but for now we need to do two separate visits.  - We will know more after we do testing at the next visit.  - The skin testing visit can be squeezed in at your convenience.  - Then we can make a more full plan to address all of your symptoms. - Be sure to stop your antihistamines for 3 days before this appointment.   2. Recurrent infections - We will obtain some screening labs to evaluate your immune system.  - Labs to evaluate the quantitative [facility]) aspects of your immune system: IgG/IgA/IgM, CBC with differential - Labs to evaluate the qualitative (HOW WELL THEY WORK) aspects of your immune system: CH50, Pneumococcal titers, Tetanus titers, Diphtheria titers - We may consider immunizations with Pneumovax and Tdap to challenge your immune system, and then obtain repeat titers in 4-6 weeks.  - I am not sure what is going on with your immune system, but this workup will help answer some questions.   3. Moderate persistent asthma, uncomplicated - Lung testing a little lower, but it did improve with the albuterol  treatment. - I am not going to make any changes today. - Daily controller medication(s): Symbicort  160/4.43mcg two puffs twice daily with spacer - Prior to physical activity: AirSupra  2 puffs 10-15 minutes before physical activity. - Rescue medications: AirSupra  4 puffs every 4-6 hours as needed - Asthma control goals:  * Full participation in all desired activities (may need albuterol  before activity) * Albuterol  use two time or less a week on average (not counting use with activity) * Cough interfering with sleep two time or less a month * Oral steroids no more than once a year * No hospitalizations  4. Return in about 1 week (around 07/08/2023) for SKIN TESTING. You can have the follow up appointment with Dr. Iva or a  Nurse Practicioner (our Nurse Practitioners are excellent and always have Physician oversight!).    Please inform us  of any Emergency Department visits, hospitalizations, or changes in symptoms. Call us  before going to the ED for breathing or allergy  symptoms since we might be able to fit you in for a sick visit. Feel free to contact us  anytime with any questions, problems, or concerns.  It was a pleasure to meet you today! I am pretty sure we are BFFs! It was lovely meeting you!   Websites that have reliable patient information: 1. American Academy of Asthma, Allergy , and Immunology: www.aaaai.org 2. Food Allergy  Research and Education (FARE): foodallergy.org 3. Mothers of Asthmatics: http://www.asthmacommunitynetwork.org 4. American College of Allergy , Asthma, and Immunology: www.acaai.org      "Like" us  on Facebook and Instagram for our latest updates!      A healthy democracy works best when [employer] participate! Make sure you are registered to vote! If you have moved or changed any of your contact information, you will need to get this updated before voting! Scan the QR codes below to learn more!

## 2023-07-06 LAB — STREP PNEUMONIAE 23 SEROTYPES IGG
Pneumo Ab Type 1*: 3.6 ug/mL
Pneumo Ab Type 12 (12F)*: <0.1 ug/mL — ABNORMAL LOW
Pneumo Ab Type 14*: <0.1 ug/mL — ABNORMAL LOW
Pneumo Ab Type 17 (17F)*: 2.6 ug/mL
Pneumo Ab Type 19 (19F)*: <0.1 ug/mL — ABNORMAL LOW
Pneumo Ab Type 2*: <0.2 ug/mL — ABNORMAL LOW
Pneumo Ab Type 20*: 3.5 ug/mL
Pneumo Ab Type 22 (22F)*: <0.1 ug/mL — ABNORMAL LOW
Pneumo Ab Type 23 (23F)*: <0.1 ug/mL — ABNORMAL LOW
Pneumo Ab Type 26 (6B)*: <0.1 ug/mL — ABNORMAL LOW
Pneumo Ab Type 3*: <0.1 ug/mL — ABNORMAL LOW
Pneumo Ab Type 34 (10A)*: <0.1 ug/mL — ABNORMAL LOW
Pneumo Ab Type 4*: <0.1 ug/mL — ABNORMAL LOW
Pneumo Ab Type 43 (11A)*: <0.1 ug/mL — ABNORMAL LOW
Pneumo Ab Type 5*: 0.2 ug/mL — ABNORMAL LOW
Pneumo Ab Type 51 (7F)*: <0.1 ug/mL — ABNORMAL LOW
Pneumo Ab Type 54 (15B)*: 0.2 ug/mL — ABNORMAL LOW
Pneumo Ab Type 56 (18C)*: 0.3 ug/mL — ABNORMAL LOW
Pneumo Ab Type 57 (19A)*: 0.8 ug/mL — ABNORMAL LOW
Pneumo Ab Type 68 (9V)*: <0.1 ug/mL — ABNORMAL LOW
Pneumo Ab Type 70 (33F)*: 0.4 ug/mL — ABNORMAL LOW
Pneumo Ab Type 8*: <0.3 ug/mL — ABNORMAL LOW
Pneumo Ab Type 9 (9N)*: <0.1 ug/mL — ABNORMAL LOW

## 2023-07-06 LAB — CBC WITH DIFFERENTIAL/PLATELET
Basophils Absolute: 0 x10E3/uL (ref 0.0–0.2)
Basos: 1 %
EOS (ABSOLUTE): 0.1 x10E3/uL (ref 0.0–0.4)
Eos: 1 %
Hematocrit: 44 % (ref 34.0–46.6)
Hemoglobin: 14.3 g/dL (ref 11.1–15.9)
Immature Grans (Abs): 0 x10E3/uL (ref 0.0–0.1)
Immature Granulocytes: 0 %
Lymphocytes Absolute: 1.4 x10E3/uL (ref 0.7–3.1)
Lymphs: 18 %
MCH: 33.6 pg — ABNORMAL HIGH (ref 26.6–33.0)
MCHC: 32.5 g/dL (ref 31.5–35.7)
MCV: 103 fL — ABNORMAL HIGH (ref 79–97)
Monocytes Absolute: 0.4 x10E3/uL (ref 0.1–0.9)
Monocytes: 5 %
Neutrophils Absolute: 5.9 x10E3/uL (ref 1.4–7.0)
Neutrophils: 75 %
Platelets: 318 x10E3/uL (ref 150–450)
RBC: 4.26 x10E6/uL (ref 3.77–5.28)
RDW: 12.1 % (ref 11.7–15.4)
WBC: 7.9 x10E3/uL (ref 3.4–10.8)

## 2023-07-06 LAB — DIPHTHERIA / TETANUS ANTIBODY PANEL
Diphtheria Ab: 0.68 [IU]/mL
Tetanus Ab, IgG: 5.28 [IU]/mL

## 2023-07-06 LAB — IGG, IGA, IGM
IgA/Immunoglobulin A, Serum: 86 mg/dL — ABNORMAL LOW (ref 87–352)
IgG (Immunoglobin G), Serum: 1001 mg/dL (ref 586–1602)
IgM (Immunoglobulin M), Srm: 86 mg/dL (ref 26–217)

## 2023-07-06 LAB — COMPLEMENT, TOTAL: Compl, Total (CH50): >60 U/mL (ref >41)

## 2023-07-08 ENCOUNTER — Telehealth: Payer: Self-pay | Admitting: *Deleted

## 2023-07-08 ENCOUNTER — Ambulatory Visit: Payer: 59 | Admitting: Allergy & Immunology

## 2023-07-08 ENCOUNTER — Encounter: Payer: Self-pay | Admitting: Allergy & Immunology

## 2023-07-08 DIAGNOSIS — L299 Pruritus, unspecified: Secondary | ICD-10-CM

## 2023-07-08 DIAGNOSIS — B999 Unspecified infectious disease: Secondary | ICD-10-CM

## 2023-07-08 DIAGNOSIS — J302 Other seasonal allergic rhinitis: Secondary | ICD-10-CM

## 2023-07-08 DIAGNOSIS — J3089 Other allergic rhinitis: Secondary | ICD-10-CM

## 2023-07-08 DIAGNOSIS — J4540 Moderate persistent asthma, uncomplicated: Secondary | ICD-10-CM

## 2023-07-08 MED ORDER — LEVOCETIRIZINE DIHYDROCHLORIDE 5 MG PO TABS: 5.0000 mg | ORAL_TABLET | Freq: Every evening | ORAL | 1 refills | Status: DC

## 2023-07-08 MED ORDER — MONTELUKAST SODIUM 10 MG PO TABS: 10.0000 mg | ORAL_TABLET | Freq: Every day | ORAL | 1 refills | Status: DC

## 2023-07-08 NOTE — Patient Instructions (Addendum)
 1. Chronic rhinitis - Testing today showed: grasses, weeds, trees, indoor molds, outdoor molds, and dust mites - Copy of test results provided.  - Avoidance measures provided. - Stop taking: Claritin (this is one of the weaker antihistamines) - Continue with: Flonase Sensimist two sprays per nostril twice daily - Start taking: Xyzal  (levocetirizine) 5mg  tablet once daily and Singulair  (montelukast ) 10mg  daily - Singulair  can cause depression and anxiety, so beware.  - You can use an extra dose of the antihistamine, if needed, for breakthrough symptoms.  - Consider nasal saline rinses 1-2 times daily to remove allergens from the nasal cavities as well as help with mucous clearance (this is especially helpful to do before the nasal sprays are given) - Consider allergy  shots as a means of long-term control. - Allergy  shots re-train and reset the immune system to ignore environmental allergens and decrease the resulting immune response to those allergens (sneezing, itchy watery eyes, runny nose, nasal congestion, etc).    - Allergy  shots improve symptoms in 75-85% of patients.  - We can discuss more at the next appointment if the medications are not working for you.  2. Recurrent infections - Your immune system overall looked great. - However, you were only protective to 3 out of 23 flavors of Streptococcus pneumonia. - I recommend that you get a Pneumovax or a Prevnar-20 and then get repeat titers 4-6 weeks after the immunization is given. - You can schedule this on your way out (we cannot do it the same day as your testing).  3. Mouth itching - Testing was reactive to almond, strawberry and tomato. - Otherwise you were negative to the most common foods.  - You can avoid tree nuts for a period of time to see if this helps, but it was fairly small overall. - I think we can avoid an EpiPen unless you want it. - Continue to keep strawberry and tomato in your diet, but just limit your amounts.    4. Moderate persistent asthma, uncomplicated - Lung testing looks amazing today.  - I am not going to make any changes today. - Daily controller medication(s): Symbicort  160/4.36mcg two puffs twice daily with spacer - Prior to physical activity: AirSupra  2 puffs 10-15 minutes before physical activity. - Rescue medications: AirSupra  4 puffs every 4-6 hours as needed - Asthma control goals:  * Full participation in all desired activities (may need albuterol  before activity) * Albuterol  use two time or less a week on average (not counting use with activity) * Cough interfering with sleep two time or less a month * Oral steroids no more than once a year * No hospitalizations  5. Return in about 3 months (around 10/06/2023). You can have the follow up appointment with Dr. Iva or a Nurse Practicioner (our Nurse Practitioners are excellent and always have Physician oversight!).    Please inform us  of any Emergency Department visits, hospitalizations, or changes in symptoms. Call us  before going to the ED for breathing or allergy  symptoms since we might be able to fit you in for a sick visit. Feel free to contact us  anytime with any questions, problems, or concerns.  It was a pleasure to see you again today!   Websites that have reliable patient information: 1. American Academy of Asthma, Allergy , and Immunology: www.aaaai.org 2. Food Allergy  Research and Education (FARE): foodallergy.org 3. Mothers of Asthmatics: http://www.asthmacommunitynetwork.org 4. American College of Allergy , Asthma, and Immunology: www.acaai.org      "Like" us  on [employer] and Instagram for  our latest updates!      A healthy democracy works best when [employer] participate! Make sure you are registered to vote! If you have moved or changed any of your contact information, you will need to get this updated before voting! Scan the QR codes below to learn more!       Airborne Adult Perc - 07/08/23 0900      Time Antigen Placed 9077    Allergen Manufacturer Jestine    Location Back    Number of Test 55    1. Control-Buffer 50% Glycerol Negative    2. Control-Histamine 2+    3. Bahia 2+    4. Bermuda Negative    5. Johnson Negative    6. [place]  Blue Negative    7. Meadow Fescue Negative    8. Perennial Rye 2+    9. Timothy Negative    10. Ragweed Mix Negative    11. Cocklebur 2+    12. Plantain,  English Negative    13. Baccharis Negative    14. Dog Fennel 2+    15. Russian Thistle Negative    16. Lamb's Quarters 2+    17. Sheep Sorrell Negative    18. Rough Pigweed 2+    19. Marsh Elder, Rough Negative    20. Mugwort, Common Negative    21. Box, Elder 2+    22. Cedar, red Negative    23. Sweet Gum Negative    24. Pecan Pollen Negative    25. Pine Mix Negative    26. Walnut, Black Pollen 3+    27. Red Mulberry 2+    28. Ash Mix Negative    29. Birch Mix 3+    30. Beech American Negative    31. Cottonwood, Eastern Negative    32. Hickory, White Negative    33. Maple Mix 3+    34. Oak, Eastern Mix Negative    35. Sycamore Eastern Negative    36. Alternaria Alternata Negative    37. Cladosporium Herbarum 2+    38. Aspergillus Mix 2+    39. Penicillium Mix 2+    40. Bipolaris Sorokiniana (Helminthosporium) 2+    41. Drechslera Spicifera (Curvularia) Negative    42. Mucor Plumbeus Negative    43. Fusarium Moniliforme Negative    44. Aureobasidium Pullulans (pullulara) Negative    45. Rhizopus Oryzae Negative    46. Botrytis Cinera Negative    47. Epicoccum Nigrum Negative    48. Phoma Betae Negative    49. Dust Mite Mix Negative    50. Cat Hair 10,000 BAU/ml Negative    51.  Dog Epithelia Negative    52. Mixed Feathers Negative    53. Horse Epithelia Negative    54. Cockroach, German Negative    55. Tobacco Leaf Negative             Intradermal - 07/08/23 1011     Time Antigen Placed 1015    Allergen Manufacturer Jestine    Location Arm    Control  Negative    Bermuda Negative    Johnson Negative    Ragweed Mix Negative    Mold 4 Negative    Mite Mix 3+    Cat Negative    Dog Negative    Cockroach Negative             Food Adult Perc - 07/08/23 0900     Time Antigen Placed 9076    Allergen Manufacturer Jestine  Location Back    Number of allergen test 19    1. Peanut Negative    2. Soybean Negative    3. Wheat Negative    4. Sesame Negative    5. Milk, Cow Negative    6. Casein Negative    7. Egg White, Chicken Negative    8. Shellfish Mix Negative    9. Fish Mix Negative    10. Cashew Negative    11. Walnut Food Negative    12. Almond --   3 x 5   13. Hazelnut Negative    14. Pecan Food Negative    15. Pistachio Negative    16. Brazil Nut Negative    17. Coconut Negative    38. Tomato --   2 x 3   60. Strawberry --   2 x 3            Reducing Pollen Exposure  The American Academy of Allergy , Asthma and Immunology suggests the following steps to reduce your exposure to pollen during allergy  seasons.    Do not hang sheets or clothing out to dry; pollen may collect on these items. Do not mow lawns or spend time around freshly cut grass; mowing stirs up pollen. Keep windows closed at night.  Keep car windows closed while driving. Minimize morning activities outdoors, a time when pollen counts are usually at their highest. Stay indoors as much as possible when pollen counts or humidity is high and on windy days when pollen tends to remain in the air longer. Use air conditioning when possible.  Many air conditioners have filters that trap the pollen spores. Use a HEPA room air filter to remove pollen form the indoor air you breathe.  Control of Mold Allergen   Mold and fungi can grow on a variety of surfaces provided certain temperature and moisture conditions exist.  Outdoor molds grow on plants, decaying vegetation and soil.  The major outdoor mold, Alternaria and Cladosporium, are found in very high  numbers during hot and dry conditions.  Generally, a late Summer - Fall peak is seen for common outdoor fungal spores.  Rain will temporarily lower outdoor mold spore count, but counts rise rapidly when the rainy period ends.  The most important indoor molds are Aspergillus and Penicillium.  Dark, humid and poorly ventilated basements are ideal sites for mold growth.  The next most common sites of mold growth are the bathroom and the kitchen.  Outdoor (Seasonal) Mold Control  Positive outdoor molds via skin testing: Cladosporium and Bipolaris (Helminthsporium)  Use air conditioning and keep windows closed Avoid exposure to decaying vegetation. Avoid leaf raking. Avoid grain handling. Consider wearing a face mask if working in moldy areas.    Indoor (Perennial) Mold Control   Positive indoor molds via skin testing: Aspergillus and Penicillium  Maintain humidity below 50%. Clean washable surfaces with 5% bleach solution. Remove sources e.g. contaminated carpets.    Control of Dust Mite Allergen    Dust mites play a major role in allergic asthma and rhinitis.  They occur in environments with high humidity wherever human skin is found.  Dust mites absorb humidity from the atmosphere (ie, they do not drink) and feed on organic matter (including shed human and animal skin).  Dust mites are a microscopic type of insect that you cannot see with the naked eye.  High levels of dust mites have been detected from mattresses, pillows, carpets, upholstered furniture, bed covers, clothes, soft toys and any  woven material.  The principal allergen of the dust mite is found in its feces.  A gram of dust may contain 1,000 mites and 250,000 fecal particles.  Mite antigen is easily measured in the air during house cleaning activities.  Dust mites do not bite and do not cause harm to humans, other than by triggering allergies/asthma.    Ways to decrease your exposure to dust mites in your home:  Encase  mattresses, box springs and pillows with a mite-impermeable barrier or cover   Wash sheets, blankets and drapes weekly in hot water (130 F) with detergent and dry them in a dryer on the hot setting.  Have the room cleaned frequently with a vacuum cleaner and a damp dust-mop.  For carpeting or rugs, vacuuming with a vacuum cleaner equipped with a high-efficiency particulate air (HEPA) filter.  The dust mite allergic individual should not be in a room which is being cleaned and should wait 1 hour after cleaning before going into the room. Do not sleep on upholstered furniture (eg, couches).   If possible removing carpeting, upholstered furniture and drapery from the home is ideal.  Horizontal blinds should be eliminated in the rooms where the person spends the most time (bedroom, study, television room).  Washable vinyl, roller-type shades are optimal. Remove all non-washable stuffed toys from the bedroom.  Wash stuffed toys weekly like sheets and blankets above.   Reduce indoor humidity to less than 50%.  Inexpensive humidity monitors can be purchased at most hardware stores.  Do not use a humidifier as can make the problem worse and are not recommended.  Allergy  Shots  Allergies are the result of a chain reaction that starts in the immune system. Your immune system controls how your body defends itself. For instance, if you have an allergy  to pollen, your immune system identifies pollen as an invader or allergen. Your immune system overreacts by producing antibodies called Immunoglobulin E (IgE). These antibodies travel to cells that release chemicals, causing an allergic reaction.  The concept behind allergy  immunotherapy, whether it is received in the form of shots or tablets, is that the immune system can be desensitized to specific allergens that trigger allergy  symptoms. Although it requires time and patience, the payback can be long-term relief. Allergy  injections contain a dilute solution of  those substances that you are allergic to based upon your skin testing and allergy  history.   How Do Allergy  Shots Work?  Allergy  shots work much like a vaccine. Your body responds to injected amounts of a particular allergen given in increasing doses, eventually developing a resistance and tolerance to it. Allergy  shots can lead to decreased, minimal or no allergy  symptoms.  There generally are two phases: build-up and maintenance. Build-up often ranges from three to six months and involves receiving injections with increasing amounts of the allergens. The shots are typically given once or twice a week, though more rapid build-up schedules are sometimes used.  The maintenance phase begins when the most effective dose is reached. This dose is different for each person, depending on how allergic you are and your response to the build-up injections. Once the maintenance dose is reached, there are longer periods between injections, typically two to four weeks.  Occasionally doctors give cortisone-type shots that can temporarily reduce allergy  symptoms. These types of shots are different and should not be confused with allergy  immunotherapy shots.  Who Can Be Treated with Allergy  Shots?  Allergy  shots may be a good treatment approach for people  with allergic rhinitis (hay fever), allergic asthma, conjunctivitis (eye allergy ) or stinging insect allergy .   Before deciding to begin allergy  shots, you should consider:   The length of allergy  season and the severity of your symptoms  Whether medications and/or changes to your environment can control your symptoms  Your desire to avoid long-term medication use  Time: allergy  immunotherapy requires a major time commitment  Cost: may vary depending on your insurance coverage  Allergy  shots for children age 56 and older are effective and often well tolerated. They might prevent the onset of new allergen sensitivities or the progression to  asthma.  Allergy  shots are not started on patients who are pregnant but can be continued on patients who become pregnant while receiving them. In some patients with other medical conditions or who take certain common medications, allergy  shots may be of risk. It is important to mention other medications you talk to your allergist.   What are the two types of build-ups offered:   RUSH or Rapid Desensitization -- one day of injections lasting from 8:30-4:30pm, injections every 1 hour.  Approximately half of the build-up process is completed in that one day.  The following week, normal build-up is resumed, and this entails ~16 visits either weekly or twice weekly, until reaching your "maintenance dose" which is continued weekly until eventually getting spaced out to every month for a duration of 3 to 5 years. The regular build-up appointments are nurse visits where the injections are administered, followed by required monitoring for 30 minutes.    Traditional build-up -- weekly visits for 6 -12 months until reaching "maintenance dose", then continue weekly until eventually spacing out to every 4 weeks as above. At these appointments, the injections are administered, followed by required monitoring for 30 minutes.     Either way is acceptable, and both are equally effective. With the rush protocol, the advantage is that less time is spent here for injections overall AND you would also reach maintenance dosing faster (which is when the clinical benefit starts to become more apparent). Not everyone is a candidate for rapid desensitization.   IF we proceed with the RUSH protocol, there are premedications which must be taken the day before and the day after the rush only (this includes antihistamines, steroids, and Singulair ).  After the rush day, no prednisone  or Singulair  is required, and we just recommend antihistamines taken on your injection day.  What Is An Estimate of the Costs?  If you are  interested in starting allergy  injections, please check with your insurance company about your coverage for both allergy  vial sets and allergy  injections.  Please do so prior to making the appointment to start injections.  The following are CPT codes to give to your insurance company. These are the amounts we BILL to the insurance company, but the amount YOU WILL PAY and WE RECEIVE IS SUBSTANTIALLY LESS and depends on the contracts we have with different insurance companies.   Amount Billed to Insurance One allergy  vial set  CPT 95165   $ 1200     Two allergy  vial set  CPT 95165   $ 2400     Three allergy  vial set  CPT 95165   $ 3600     One injection   CPT 95115   $ 35  Two injections   CPT 95117   $ 40 RUSH (Rapid Desensitization) CPT 95180 x 8 hours $500/hour  Regarding the allergy  injections, your co-pay may or may not apply  with each injection, so please confirm this with your insurance company. When you start allergy  injections, 1 or 2 sets of vials are made based on your allergies.  Not all patients can be on one set of vials. A set of vials lasts 6 months to a year depending on how quickly you can proceed with your build-up of your allergy  injections. Vials are personalized for each patient depending on their specific allergens.  How often are allergy  injection given during the build-up period?   Injections are given at least weekly during the build-up period until your maintenance dose is achieved. Per the doctor's discretion, you may have the option of getting allergy  injections two times per week during the build-up period. However, there must be at least 48 hours between injections. The build-up period is usually completed within 6-12 months depending on your ability to schedule injections and for adjustments for reactions. When maintenance dose is reached, your injection schedule is gradually changed to every two weeks and later to every three weeks. Injections will then continue every 4  weeks. Usually, injections are continued for a total of 3-5 years.   When Will I Feel Better?  Some may experience decreased allergy  symptoms during the build-up phase. For others, it may take as long as 12 months on the maintenance dose. If there is no improvement after a year of maintenance, your allergist will discuss other treatment options with you.  If you aren't responding to allergy  shots, it may be because there is not enough dose of the allergen in your vaccine or there are missing allergens that were not identified during your allergy  testing. Other reasons could be that there are high levels of the allergen in your environment or major exposure to non-allergic triggers like tobacco smoke.  What Is the Length of Treatment?  Once the maintenance dose is reached, allergy  shots are generally continued for three to five years. The decision to stop should be discussed with your allergist at that time. Some people may experience a permanent reduction of allergy  symptoms. Others may relapse and a longer course of allergy  shots can be considered.  What Are the Possible Reactions?  The two types of adverse reactions that can occur with allergy  shots are local and systemic. Common local reactions include very mild redness and swelling at the injection site, which can happen immediately or several hours after. Report a delayed reaction from your last injection. These include arm swelling or runny nose, watery eyes or cough that occurs within 12-24 hours after injection. A systemic reaction, which is less common, affects the entire body or a particular body system. They are usually mild and typically respond quickly to medications. Signs include increased allergy  symptoms such as sneezing, a stuffy nose or hives.   Rarely, a serious systemic reaction called anaphylaxis can develop. Symptoms include swelling in the throat, wheezing, a feeling of tightness in the chest, nausea or dizziness. Most serious  systemic reactions develop within 30 minutes of allergy  shots. This is why it is strongly recommended you wait in your doctor's office for 30 minutes after your injections. Your allergist is trained to watch for reactions, and his or her staff is trained and equipped with the proper medications to identify and treat them.   Report to the nurse immediately if you experience any of the following symptoms: swelling, itching or redness of the skin, hives, watery eyes/nose, breathing difficulty, excessive sneezing, coughing, stomach pain, diarrhea, or light headedness. These symptoms may occur within  15-20 minutes after injection and may require medication.   Who Should Administer Allergy  Shots?  The preferred location for receiving shots is your prescribing allergist's office. Injections can sometimes be given at another facility where the physician and staff are trained to recognize and treat reactions, and have received instructions by your prescribing allergist.  What if I am late for an injection?   Injection dose will be adjusted depending upon how many days or weeks you are late for your injection.   What if I am sick?   Please report any illness to the nurse before receiving injections. She may adjust your dose or postpone injections depending on your symptoms. If you have fever, flu, sinus infection or chest congestion it is best to postpone allergy  injections until you are better. Never get an allergy  injection if your asthma is causing you problems. If your symptoms persist, seek out medical care to get your health problem under control.  What If I am or Become Pregnant:  Women that become pregnant should schedule an appointment with [facility] before receiving any further allergy  injections.

## 2023-07-08 NOTE — Telephone Encounter (Signed)
-----   Message from Alfonse Spruce sent at 07/08/2023  5:46 PM EST ----- Yes I talked to her about the results today in clinic.

## 2023-07-08 NOTE — Telephone Encounter (Signed)
 I called and scheduled the patient to receive her Pneumovax injection in the [place] office.

## 2023-07-08 NOTE — Progress Notes (Signed)
 FOLLOW UP  Date of Service/Encounter:  07/08/23   Assessment:   Perennial and seasonal allergic rhinitis (grasses, weeds, trees, indoor molds, outdoor molds, and dust mites)   Recurrent infections - with inadequate response to Streptococcus pneumonia   Moderate persistent asthma, uncomplicated   MAI with long COVID - followed by Dr. Kassie   Plan/Recommendations:   1. Chronic rhinitis - Testing today showed: grasses, weeds, trees, indoor molds, outdoor molds, and dust mites - Copy of test results provided.  - Avoidance measures provided. - Stop taking: Claritin (this is one of the weaker antihistamines) - Continue with: Flonase Sensimist two sprays per nostril twice daily - Start taking: Xyzal  (levocetirizine) 5mg  tablet once daily and Singulair  (montelukast ) 10mg  daily - Singulair  can cause depression and anxiety, so beware.  - You can use an extra dose of the antihistamine, if needed, for breakthrough symptoms.  - Consider nasal saline rinses 1-2 times daily to remove allergens from the nasal cavities as well as help with mucous clearance (this is especially helpful to do before the nasal sprays are given) - Consider allergy  shots as a means of long-term control. - Allergy  shots re-train and reset the immune system to ignore environmental allergens and decrease the resulting immune response to those allergens (sneezing, itchy watery eyes, runny nose, nasal congestion, etc).    - Allergy  shots improve symptoms in 75-85% of patients.  - We can discuss more at the next appointment if the medications are not working for you.  2. Recurrent infections - Your immune system overall looked great. - However, you were only protective to 3 out of 23 flavors of Streptococcus pneumonia. - I recommend that you get a Pneumovax or a Prevnar-20 and then get repeat titers 4-6 weeks after the immunization is given. - You can schedule this on your way out (we cannot do it the same day as  your testing).  3. Mouth itching - Testing was reactive to almond, strawberry and tomato. - Otherwise you were negative to the most common foods.  - You can avoid tree nuts for a period of time to see if this helps, but it was fairly small overall. - I think we can avoid an EpiPen unless you want it. - Continue to keep strawberry and tomato in your diet, but just limit your amounts.   4. Moderate persistent asthma, uncomplicated - Lung testing looks amazing today.  - I am not going to make any changes today. - Daily controller medication(s): Symbicort  160/4.63mcg two puffs twice daily with spacer - Prior to physical activity: AirSupra  2 puffs 10-15 minutes before physical activity. - Rescue medications: AirSupra  4 puffs every 4-6 hours as needed - Asthma control goals:  * Full participation in all desired activities (may need albuterol  before activity) * Albuterol  use two time or less a week on average (not counting use with activity) * Cough interfering with sleep two time or less a month * Oral steroids no more than once a year * No hospitalizations  5. Return in about 3 months (around 10/06/2023). You can have the follow up appointment with Dr. Iva or a Nurse Practicioner (our Nurse Practitioners are excellent and always have Physician oversight!).     Subjective:   Cheryl Wright is a 48 y.o. female presenting today for follow up of No chief complaint on file.   Cheryl Wright has a history of the following: Patient Active Problem List   Diagnosis Date Noted   Positive ANA (antinuclear antibody)  05/12/2023   Hives 05/12/2023   Abnormal CT scan of lung 05/12/2023   Polyarthralgia 11/20/2022   COVID-19 long hauler 08/02/2022   SVD (spontaneous vaginal delivery) 06/02/2012   Normal pregnancy 06/01/2012    History obtained from: chart review and patient.  Discussed the use of AI scribe software for clinical note transcription with the patient and/or guardian,  who gave verbal consent to proceed.  Cheryl Wright is a 48 y.o. female presenting for skin testing. She was last seen last week. We could not do testing because her insurance company does not cover testing on the same day as a New Patient visit. She has been off of all antihistamines 3 days in anticipation of the testing.   We saw her in January 1 week ago.  We did get an immune workup at that time.  Her lung testing looked lower, but improved with the albuterol .  We continue with Symbicort  160 mcg 2 puffs twice daily and AirSupra  2 puffs every 4 hours as needed.  Otherwise, there have been no changes to her past medical history, surgical history, family history, or social history.    Review of systems otherwise negative other than that mentioned in the HPI.    Objective:   There were no vitals taken for this visit. There is no height or weight on file to calculate BMI.    Physical exam deferred since this was a skin testing appointment only.   Diagnostic studies:    Spirometry: results normal (FEV1: 3.34/100%, FVC: 4.52/108%, FEV1/FVC: 74%).    Spirometry consistent with normal pattern.   Allergy  Studies:     Airborne Adult Perc - 07/08/23 0900     Time Antigen Placed 9077    Allergen Manufacturer Jestine    Location Back    Number of Test 55    1. Control-Buffer 50% Glycerol Negative    2. Control-Histamine 2+    3. Bahia 2+    4. Bermuda Negative    5. Johnson Negative    6. Kentucky  Blue Negative    7. Meadow Fescue Negative    8. Perennial Rye 2+    9. Timothy Negative    10. Ragweed Mix Negative    11. Cocklebur 2+    12. Plantain,  English Negative    13. Baccharis Negative    14. Dog Fennel 2+    15. Russian Thistle Negative    16. Lamb's Quarters 2+    17. Sheep Sorrell Negative    18. Rough Pigweed 2+    19. Marsh Elder, Rough Negative    20. Mugwort, Common Negative    21. Box, Elder 2+    22. Cedar, red Negative    23. Sweet Gum Negative    24. Pecan  Pollen Negative    25. Pine Mix Negative    26. Walnut, Black Pollen 3+    27. Red Mulberry 2+    28. Ash Mix Negative    29. Birch Mix 3+    30. Beech American Negative    31. Cottonwood, Eastern Negative    32. Hickory, White Negative    33. Maple Mix 3+    34. Oak, Eastern Mix Negative    35. Sycamore Eastern Negative    36. Alternaria Alternata Negative    37. Cladosporium Herbarum 2+    38. Aspergillus Mix 2+    39. Penicillium Mix 2+    40. Bipolaris Sorokiniana (Helminthosporium) 2+    41. Drechslera Spicifera (Curvularia) Negative  42. Mucor Plumbeus Negative    43. Fusarium Moniliforme Negative    44. Aureobasidium Pullulans (pullulara) Negative    45. Rhizopus Oryzae Negative    46. Botrytis Cinera Negative    47. Epicoccum Nigrum Negative    48. Phoma Betae Negative    49. Dust Mite Mix Negative    50. Cat Hair 10,000 BAU/ml Negative    51.  Dog Epithelia Negative    52. Mixed Feathers Negative    53. Horse Epithelia Negative    54. Cockroach, German Negative    55. Tobacco Leaf Negative             Intradermal - 07/08/23 1011     Time Antigen Placed 1015    Allergen Manufacturer Jestine    Location Arm    Control Negative    Bermuda Negative    Johnson Negative    Ragweed Mix Negative    Mold 4 Negative    Mite Mix 3+    Cat Negative    Dog Negative    Cockroach Negative             Food Adult Perc - 07/08/23 0900     Time Antigen Placed 9076    Allergen Manufacturer Jestine    Location Back    Number of allergen test 19    1. Peanut Negative    2. Soybean Negative    3. Wheat Negative    4. Sesame Negative    5. Milk, Cow Negative    6. Casein Negative    7. Egg White, Chicken Negative    8. Shellfish Mix Negative    9. Fish Mix Negative    10. Cashew Negative    11. Walnut Food Negative    12. Almond --   3 x 5   13. Hazelnut Negative    14. Pecan Food Negative    15. Pistachio Negative    16. Brazil Nut Negative    17.  Coconut Negative    38. Tomato --   2 x 3   60. Strawberry --   2 x 3            Allergy  testing results were read and interpreted by myself, documented by clinical staff.      Cheryl Shaggy, MD  [facility] 

## 2023-07-11 ENCOUNTER — Ambulatory Visit (INDEPENDENT_AMBULATORY_CARE_PROVIDER_SITE_OTHER): Payer: 59

## 2023-07-11 DIAGNOSIS — Z23 Encounter for immunization: Secondary | ICD-10-CM

## 2023-07-11 DIAGNOSIS — B999 Unspecified infectious disease: Secondary | ICD-10-CM | POA: Diagnosis not present

## 2023-07-11 NOTE — Progress Notes (Signed)
Immunotherapy   Patient Details  Name: Cheryl Wright MRN: [id number] Date of Birth: September 03, 1975  07/11/2023  Cheryl Wright given Pnumovax 23 of .5mL Lot# [id number] Exp date 07/17/2024 NDC# 1308-6578-46  Consent signed and patient instructions given.   Florence Canner 07/11/2023, 10:08 AM

## 2023-07-14 ENCOUNTER — Ambulatory Visit: Payer: 59

## 2023-09-30 ENCOUNTER — Ambulatory Visit: Payer: 59 | Admitting: Allergy & Immunology

## 2023-09-30 ENCOUNTER — Other Ambulatory Visit: Payer: Self-pay

## 2023-09-30 ENCOUNTER — Encounter: Payer: Self-pay | Admitting: Allergy & Immunology

## 2023-09-30 VITALS — BP 116/62 | HR 115 | Temp 98.0°F | Resp 18

## 2023-09-30 DIAGNOSIS — J302 Other seasonal allergic rhinitis: Secondary | ICD-10-CM

## 2023-09-30 DIAGNOSIS — B999 Unspecified infectious disease: Secondary | ICD-10-CM | POA: Diagnosis not present

## 2023-09-30 DIAGNOSIS — L299 Pruritus, unspecified: Secondary | ICD-10-CM | POA: Diagnosis not present

## 2023-09-30 DIAGNOSIS — J4540 Moderate persistent asthma, uncomplicated: Secondary | ICD-10-CM

## 2023-09-30 DIAGNOSIS — J3089 Other allergic rhinitis: Secondary | ICD-10-CM | POA: Diagnosis not present

## 2023-09-30 NOTE — Progress Notes (Signed)
 FOLLOW UP  Date of Service/Encounter:  09/30/23   Assessment:   Perennial and seasonal allergic rhinitis (grasses, weeds, trees, indoor molds, outdoor molds, and dust mites)   Recurrent infections - with inadequate response to Streptococcus pneumonia   Moderate persistent asthma, uncomplicated   MAI with long COVID - followed by Dr. Everardo All   Plan/Recommendations:   1. Chronic rhinitis - Testing at the last visit showed: grasses, weeds, trees, indoor molds, outdoor molds, and dust mites - Continue with: Flonase Sensimist two sprays per nostril twice daily - Continue with: Xyzal (levocetirizine) 5mg  tablet once daily and Singulair (montelukast) 10mg  daily - Singulair can cause depression and anxiety, so beware.  - You can use an extra dose of the antihistamine, if needed, for breakthrough symptoms.  - Consider nasal saline rinses 1-2 times daily to remove allergens from the nasal cavities as well as help with mucous clearance (this is especially helpful to do before the nasal sprays are given) - Strongly consider allergy shots as a means of long-term control. - Allergy shots "re-train" and "reset" the immune system to ignore environmental allergens and decrease the resulting immune response to those allergens (sneezing, itchy watery eyes, runny nose, nasal congestion, etc).    - Allergy shots improve symptoms in 75-85% of patients.  - Check with your insurance company and call us when you make a decision.  - CPT codes provided.   2. Recurrent infections - We are going to recheck your Streptococcal titers.  - You were only protective to 3 out of 23 flavors of Streptococcus pneumonia in the past.   3. Mouth itching - Continue to avoid tree nuts, strawberries (fresh), and limit tomato. - You can introduce - Otherwise you were negative to the most common foods.  - You can avoid tree nuts for a period of time to see if this helps, but it was fairly small overall. - I think we can  avoid an EpiPen unless you want it. - Continue to keep strawberry and tomato in your diet, but just limit your amounts.   4. Moderate persistent asthma, uncomplicated - Lung testing looks amazing today.  - I am not going to make any changes today. - Daily controller medication(s): Symbicort 160/4.61mcg two puffs twice daily with spacer - Prior to physical activity: AirSupra 2 puffs 10-15 minutes before physical activity. - Rescue medications: AirSupra 4 puffs every 4-6 hours as needed - Asthma control goals:  * Full participation in all desired activities (may need albuterol before activity) * Albuterol use two time or less a week on average (not counting use with activity) * Cough interfering with sleep two time or less a month * Oral steroids no more than once a year * No hospitalizations  5. Return in about 6 months (around 03/31/2024). You can have the follow up appointment with Dr. Dellis Anes or a Nurse Practicioner (our Nurse Practitioners are excellent and always have Physician oversight!).   Subjective:   Cheryl Wright is a 48 y.o. female presenting today for follow up of  Chief Complaint  Patient presents with   Asthma   Allergies    Cheryl Wright has a history of the following: Patient Active Problem List   Diagnosis Date Noted   Positive ANA (antinuclear antibody) 05/12/2023   Hives 05/12/2023   Abnormal CT scan of lung 05/12/2023   Polyarthralgia 11/20/2022   COVID-19 long hauler 08/02/2022   SVD (spontaneous vaginal delivery) 06/02/2012   Normal pregnancy 06/01/2012  History obtained from: chart review and patient.  Discussed the use of AI scribe software for clinical note transcription with the patient and/or guardian, who gave verbal consent to proceed.  Cheryl Wright is a 48 y.o. female presenting for a follow up visit. She was last sdeen in Jan 2025. At that time, we did testing that was positive to indoor and outdoor allergens. We stopped the loratadine  and continued with Flonase. We started her on levocetirizine 5mg  daily and montelukast. For her recurrent infections, her labs looked mostly good, but she was not protective aghainst Streptococcal pneumonia (only protective to 3/23 serotypes). For her mouth itching, she had testing that was reactive to almond as well as strawberry and tomato. Asthma was under good control with Symbicort 160 two puffs BID.   Since last visit, she has done well.  Asthma/Respiratory Symptom History: She remains on her Symbicort 2 puffs in the morning and 2 puffs at night.  This seems to be working well to control her symptoms.  She has a rescue medication as well.  She does feel like the addition of the spacer did help with medication delivery.  Her Symbicort seems to work a lot better with this.    Allergic Rhinitis Symptom History: She has been experiencing significant allergy symptoms, which have worsened over the past month despite being on a previously effective regimen. Her eyes are extremely itchy, leading to tearing. She uses Visine with allergy relief and regular eye drops to manage these symptoms. Additionally, she uses Flonase Sensimist nasal spray daily, which she finds helpful, and is on Singulair and Xyzal, which she takes once daily. She considers increasing Xyzal to twice daily on particularly bad days.  Food Allergy Symptom History: She has multiple food allergies, including to tree nuts, strawberries, tomatoes, and almonds. She has eliminated these from her diet, except for peanuts, and has noticed fewer mouth and hand rashes. Cooked tomatoes do not seem to cause issues, but she avoids raw tomatoes and large quantities. Strawberry jam is tolerated well.  Infection Symptom History: She has a history of recurrent infections and received a Pneumovax vaccine, which she believes has helped reduce the severity of her infections. 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 She notes that her infection was not severe.  She has generally felt better since she got the vaccine.  She has been wearing a boot for a foot injury for approximately 16 weeks and hopes to be out of it by the end of the month. X-rays are done every four weeks, and the last one showed signs of healing, allowing her to occasionally walk without the boot at home. She is concerned about muscle atrophy in her leg, describing it as 'like a chicken leg.'   Her kids are in fifth grade in seventh grade.  The twins are the older ones.  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  She seems to enjoy this a lot.  Otherwise, there have been no changes to her past medical history, surgical history, family history, or social history.    Review of systems otherwise negative other than that mentioned in the HPI.    Objective:   Blood pressure 116/62, pulse (!) 115, temperature 98 F (36.7 C), temperature source Temporal, resp. rate 18, SpO2 95%. There  is no height or weight on file to calculate BMI.    Physical Exam Constitutional:      Appearance: She is well-developed.     Comments: Talkative.  HENT:     Head: Normocephalic and atraumatic.     Right Ear: Tympanic membrane, ear canal and external ear normal. No drainage, swelling or tenderness. Tympanic membrane is not injected, scarred, erythematous, retracted or bulging.     Left Ear: Tympanic membrane, ear canal and external ear normal. No drainage, swelling or tenderness. Tympanic membrane is not injected, scarred, erythematous, retracted or bulging.     Nose: No nasal deformity, septal deviation, mucosal edema or rhinorrhea.     Right Turbinates: Enlarged, swollen and pale.     Left Turbinates: Enlarged, swollen and pale.     Right Sinus: No maxillary sinus tenderness or frontal sinus  tenderness.     Left Sinus: No maxillary sinus tenderness or frontal sinus tenderness.     Comments: No polyps.    Mouth/Throat:     Mouth: Mucous membranes are not pale and not dry.     Pharynx: Uvula midline.  Eyes:     General:        Right eye: No discharge.        Left eye: No discharge.     Conjunctiva/sclera: Conjunctivae normal.     Right eye: Right conjunctiva is not injected. No chemosis.    Left eye: Left conjunctiva is not injected. No chemosis.    Pupils: Pupils are equal, round, and reactive to light.  Cardiovascular:     Rate and Rhythm: Normal rate and regular rhythm.     Heart sounds: Normal heart sounds.  Pulmonary:     Effort: Pulmonary effort is normal. No tachypnea, accessory muscle usage or respiratory distress.     Breath sounds: Normal breath sounds. No wheezing, rhonchi or rales.  Chest:     Chest wall: No tenderness.  Musculoskeletal:     Comments: Left leg in a boot.  Lymphadenopathy:     Head:     Right side of head: No submandibular, tonsillar or occipital adenopathy.     Left side of head: No submandibular, tonsillar or occipital adenopathy.     Cervical: No cervical adenopathy.  Skin:    Coloration: Skin is not pale.     Findings: No abrasion, erythema, petechiae or rash. Rash is not papular, urticarial or vesicular.  Neurological:     Mental Status: She is alert.  Psychiatric:        Behavior: Behavior is cooperative.      Diagnostic studies:    Spirometry: Normal FEV1, FVC, and FEV1/FVC ratio. There is no scooping suggestive of obstructive disease.       Malachi Bonds, MD  [facility]

## 2023-09-30 NOTE — Patient Instructions (Addendum)
 1. Chronic rhinitis - Testing at the last visit showed: grasses, weeds, trees, indoor molds, outdoor molds, and dust mites - Continue with: Flonase Sensimist two sprays per nostril twice daily - Continue with: Xyzal (levocetirizine) 5mg  tablet once daily and Singulair (montelukast) 10mg  daily - Singulair can cause depression and anxiety, so beware.  - You can use an extra dose of the antihistamine, if needed, for breakthrough symptoms.  - Consider nasal saline rinses 1-2 times daily to remove allergens from the nasal cavities as well as help with mucous clearance (this is especially helpful to do before the nasal sprays are given) - Strongly consider allergy shots as a means of long-term control. - Allergy shots "re-train" and "reset" the immune system to ignore environmental allergens and decrease the resulting immune response to those allergens (sneezing, itchy watery eyes, runny nose, nasal congestion, etc).    - Allergy shots improve symptoms in 75-85% of patients.  - Check with your insurance company and call us when you make a decision.  - CPT codes provided.   2. Recurrent infections - We are going to recheck your Streptococcal titers.  - You were only protective to 3 out of 23 flavors of Streptococcus pneumonia in the past.   3. Mouth itching - Continue to avoid tree nuts, strawberries (fresh), and limit tomato. - You can introduce - Otherwise you were negative to the most common foods.  - You can avoid tree nuts for a period of time to see if this helps, but it was fairly small overall. - I think we can avoid an EpiPen unless you want it. - Continue to keep strawberry and tomato in your diet, but just limit your amounts.   4. Moderate persistent asthma, uncomplicated - Lung testing looks amazing today.  - I am not going to make any changes today. - Daily controller medication(s): Symbicort 160/4.66mcg two puffs twice daily with spacer - Prior to physical activity: AirSupra 2  puffs 10-15 minutes before physical activity. - Rescue medications: AirSupra 4 puffs every 4-6 hours as needed - Asthma control goals:  * Full participation in all desired activities (may need albuterol before activity) * Albuterol use two time or less a week on average (not counting use with activity) * Cough interfering with sleep two time or less a month * Oral steroids no more than once a year * No hospitalizations  5. Return in about 6 months (around 03/31/2024). You can have the follow up appointment with Dr. Dellis Anes or a Nurse Practicioner (our Nurse Practitioners are excellent and always have Physician oversight!).    Please inform us of any Emergency Department visits, hospitalizations, or changes in symptoms. Call us before going to the ED for breathing or allergy symptoms since we might be able to fit you in for a sick visit. Feel free to contact us anytime with any questions, problems, or concerns.  It was a pleasure to see you again today!   Websites that have reliable patient information: 1. American Academy of Asthma, Allergy, and Immunology: www.aaaai.org 2. Food Allergy Research and Education (FARE): foodallergy.org 3. Mothers of Asthmatics: http://www.asthmacommunitynetwork.org 4. American College of Allergy, Asthma, and Immunology: www.acaai.org      "Like" [place] on Facebook and Instagram for our latest updates!      A healthy democracy works best when [employer] participate! Make sure you are registered to vote! If you have moved or changed any of your contact information, you will need to get this updated before voting! Scan  the QR codes below to learn more!         Reducing Pollen Exposure  The American Academy of Allergy, Asthma and Immunology suggests the following steps to reduce your exposure to pollen during allergy seasons.    Do not hang sheets or clothing out to dry; pollen may collect on these items. Do not mow lawns or spend time around freshly  cut grass; mowing stirs up pollen. Keep windows closed at night.  Keep car windows closed while driving. Minimize morning activities outdoors, a time when pollen counts are usually at their highest. Stay indoors as much as possible when pollen counts or humidity is high and on windy days when pollen tends to remain in the air longer. Use air conditioning when possible.  Many air conditioners have filters that trap the pollen spores. Use a HEPA room air filter to remove pollen form the indoor air you breathe.  Control of Mold Allergen   Mold and fungi can grow on a variety of surfaces provided certain temperature and moisture conditions exist.  Outdoor molds grow on plants, decaying vegetation and soil.  The major outdoor mold, Alternaria and Cladosporium, are found in very high numbers during hot and dry conditions.  Generally, a late Summer - Fall peak is seen for common outdoor fungal spores.  Rain will temporarily lower outdoor mold spore count, but counts rise rapidly when the rainy period ends.  The most important indoor molds are Aspergillus and Penicillium.  Dark, humid and poorly ventilated basements are ideal sites for mold growth.  The next most common sites of mold growth are the bathroom and the kitchen.  Outdoor (Seasonal) Mold Control  Positive outdoor molds via skin testing: Cladosporium and Bipolaris (Helminthsporium)  Use air conditioning and keep windows closed Avoid exposure to decaying vegetation. Avoid leaf raking. Avoid grain handling. Consider wearing a face mask if working in moldy areas.    Indoor (Perennial) Mold Control   Positive indoor molds via skin testing: Aspergillus and Penicillium  Maintain humidity below 50%. Clean washable surfaces with 5% bleach solution. Remove sources e.g. contaminated carpets.    Control of Dust Mite Allergen    Dust mites play a major role in allergic asthma and rhinitis.  They occur in environments with high humidity  wherever human skin is found.  Dust mites absorb humidity from the atmosphere (ie, they do not drink) and feed on organic matter (including shed human and animal skin).  Dust mites are a microscopic type of insect that you cannot see with the naked eye.  High levels of dust mites have been detected from mattresses, pillows, carpets, upholstered furniture, bed covers, clothes, soft toys and any woven material.  The principal allergen of the dust mite is found in its feces.  A gram of dust may contain 1,000 mites and 250,000 fecal particles.  Mite antigen is easily measured in the air during house cleaning activities.  Dust mites do not bite and do not cause harm to humans, other than by triggering allergies/asthma.    Ways to decrease your exposure to dust mites in your home:  Encase mattresses, box springs and pillows with a mite-impermeable barrier or cover   Wash sheets, blankets and drapes weekly in hot water (130 F) with detergent and dry them in a dryer on the hot setting.  Have the room cleaned frequently with a vacuum cleaner and a damp dust-mop.  For carpeting or rugs, vacuuming with a vacuum cleaner equipped with a high-efficiency particulate  air (HEPA) filter.  The dust mite allergic individual should not be in a room which is being cleaned and should wait 1 hour after cleaning before going into the room. Do not sleep on upholstered furniture (eg, couches).   If possible removing carpeting, upholstered furniture and drapery from the home is ideal.  Horizontal blinds should be eliminated in the rooms where the person spends the most time (bedroom, study, television room).  Washable vinyl, roller-type shades are optimal. Remove all non-washable stuffed toys from the bedroom.  Wash stuffed toys weekly like sheets and blankets above.   Reduce indoor humidity to less than 50%.  Inexpensive humidity monitors can be purchased at most hardware stores.  Do not use a humidifier as can make the problem  worse and are not recommended.  Allergy Shots  Allergies are the result of a chain reaction that starts in the immune system. Your immune system controls how your body defends itself. For instance, if you have an allergy to pollen, your immune system identifies pollen as an invader or allergen. Your immune system overreacts by producing antibodies called Immunoglobulin E (IgE). These antibodies travel to cells that release chemicals, causing an allergic reaction.  The concept behind allergy immunotherapy, whether it is received in the form of shots or tablets, is that the immune system can be desensitized to specific allergens that trigger allergy symptoms. Although it requires time and patience, the payback can be long-term relief. Allergy injections contain a dilute solution of those substances that you are allergic to based upon your skin testing and allergy history.   How Do Allergy Shots Work?  Allergy shots work much like a vaccine. Your body responds to injected amounts of a particular allergen given in increasing doses, eventually developing a resistance and tolerance to it. Allergy shots can lead to decreased, minimal or no allergy symptoms.  There generally are two phases: build-up and maintenance. Build-up often ranges from three to six months and involves receiving injections with increasing amounts of the allergens. The shots are typically given once or twice a week, though more rapid build-up schedules are sometimes used.  The maintenance phase begins when the most effective dose is reached. This dose is different for each person, depending on how allergic you are and your response to the build-up injections. Once the maintenance dose is reached, there are longer periods between injections, typically two to four weeks.  Occasionally doctors give cortisone-type shots that can temporarily reduce allergy symptoms. These types of shots are different and should not be confused with allergy  immunotherapy shots.  Who Can Be Treated with Allergy Shots?  Allergy shots may be a good treatment approach for people with allergic rhinitis (hay fever), allergic asthma, conjunctivitis (eye allergy) or stinging insect allergy.   Before deciding to begin allergy shots, you should consider:   The length of allergy season and the severity of your symptoms  Whether medications and/or changes to your environment can control your symptoms  Your desire to avoid long-term medication use  Time: allergy immunotherapy requires a major time commitment  Cost: may vary depending on your insurance coverage  Allergy shots for children age 10 and older are effective and often well tolerated. They might prevent the onset of new allergen sensitivities or the progression to asthma.  Allergy shots are not started on patients who are pregnant but can be continued on patients who become pregnant while receiving them. In some patients with other medical conditions or who take certain common  medications, allergy shots may be of risk. It is important to mention other medications you talk to your allergist.   What are the two types of build-ups offered:   RUSH or Rapid Desensitization -- one day of injections lasting from 8:30-4:30pm, injections every 1 hour.  Approximately half of the build-up process is completed in that one day.  The following week, normal build-up is resumed, and this entails ~16 visits either weekly or twice weekly, until reaching your "maintenance dose" which is continued weekly until eventually getting spaced out to every month for a duration of 3 to 5 years. The regular build-up appointments are nurse visits where the injections are administered, followed by required monitoring for 30 minutes.    Traditional build-up -- weekly visits for 6 -12 months until reaching "maintenance dose", then continue weekly until eventually spacing out to every 4 weeks as above. At these appointments, the  injections are administered, followed by required monitoring for 30 minutes.     Either way is acceptable, and both are equally effective. With the rush protocol, the advantage is that less time is spent here for injections overall AND you would also reach maintenance dosing faster (which is when the clinical benefit starts to become more apparent). Not everyone is a candidate for rapid desensitization.   IF we proceed with the RUSH protocol, there are premedications which must be taken the day before and the day after the rush only (this includes antihistamines, steroids, and Singulair).  After the rush day, no prednisone or Singulair is required, and we just recommend antihistamines taken on your injection day.  What Is An Estimate of the Costs?  If you are interested in starting allergy injections, please check with your insurance company about your coverage for both allergy vial sets and allergy injections.  Please do so prior to making the appointment to start injections.  The following are CPT codes to give to your insurance company. These are the amounts we BILL to the insurance company, but the amount YOU WILL PAY and WE RECEIVE IS SUBSTANTIALLY LESS and depends on the contracts we have with different insurance companies.   Amount Billed to Insurance Two allergy vial set  CPT 95165   $ 2400     Two injections   CPT 95117   $ 40 RUSH (Rapid Desensitization) CPT 95180 x 8 hours  $500/hour  Regarding the allergy injections, your co-pay may or may not apply with each injection, so please confirm this with your insurance company. When you start allergy injections, 1 or 2 sets of vials are made based on your allergies.  Not all patients can be on one set of vials. A set of vials lasts 6 months to a year depending on how quickly you can proceed with your build-up of your allergy injections. Vials are personalized for each patient depending on their specific allergens.  How often are allergy  injection given during the build-up period?   Injections are given at least weekly during the build-up period until your maintenance dose is achieved. Per the doctor's discretion, you may have the option of getting allergy injections two times per week during the build-up period. However, there must be at least 48 hours between injections. The build-up period is usually completed within 6-12 months depending on your ability to schedule injections and for adjustments for reactions. When maintenance dose is reached, your injection schedule is gradually changed to every two weeks and later to every three weeks. Injections will then continue  every 4 weeks. Usually, injections are continued for a total of 3-5 years.   When Will I Feel Better?  Some may experience decreased allergy symptoms during the build-up phase. For others, it may take as long as 12 months on the maintenance dose. If there is no improvement after a year of maintenance, your allergist will discuss other treatment options with you.  If you aren't responding to allergy shots, it may be because there is not enough dose of the allergen in your vaccine or there are missing allergens that were not identified during your allergy testing. Other reasons could be that there are high levels of the allergen in your environment or major exposure to non-allergic triggers like tobacco smoke.  What Is the Length of Treatment?  Once the maintenance dose is reached, allergy shots are generally continued for three to five years. The decision to stop should be discussed with your allergist at that time. Some people may experience a permanent reduction of allergy symptoms. Others may relapse and a longer course of allergy shots can be considered.  What Are the Possible Reactions?  The two types of adverse reactions that can occur with allergy shots are local and systemic. Common local reactions include very mild redness and swelling at the injection site,  which can happen immediately or several hours after. Report a delayed reaction from your last injection. These include arm swelling or runny nose, watery eyes or cough that occurs within 12-24 hours after injection. A systemic reaction, which is less common, affects the entire body or a particular body system. They are usually mild and typically respond quickly to medications. Signs include increased allergy symptoms such as sneezing, a stuffy nose or hives.   Rarely, a serious systemic reaction called anaphylaxis can develop. Symptoms include swelling in the throat, wheezing, a feeling of tightness in the chest, nausea or dizziness. Most serious systemic reactions develop within 30 minutes of allergy shots. This is why it is strongly recommended you wait in your doctor's office for 30 minutes after your injections. Your allergist is trained to watch for reactions, and his or her staff is trained and equipped with the proper medications to identify and treat them.   Report to the nurse immediately if you experience any of the following symptoms: swelling, itching or redness of the skin, hives, watery eyes/nose, breathing difficulty, excessive sneezing, coughing, stomach pain, diarrhea, or light headedness. These symptoms may occur within 15-20 minutes after injection and may require medication.   Who Should Administer Allergy Shots?  The preferred location for receiving shots is your prescribing allergist's office. Injections can sometimes be given at another facility where the physician and staff are trained to recognize and treat reactions, and have received instructions by your prescribing allergist.  What if I am late for an injection?   Injection dose will be adjusted depending upon how many days or weeks you are late for your injection.   What if I am sick?   Please report any illness to the nurse before receiving injections. She may adjust your dose or postpone injections depending on your  symptoms. If you have fever, flu, sinus infection or chest congestion it is best to postpone allergy injections until you are better. Never get an allergy injection if your asthma is causing you problems. If your symptoms persist, seek out medical care to get your health problem under control.  What If I am or Become Pregnant:  Women that become pregnant should schedule an appointment  with [facility] before receiving any further allergy injections.

## 2023-10-04 LAB — STREP PNEUMONIAE 23 SEROTYPES IGG
Pneumo Ab Type 1*: 12.1 ug/mL (ref >1.3)
Pneumo Ab Type 12 (12F)*: <0.1 ug/mL — ABNORMAL LOW (ref >1.3)
Pneumo Ab Type 14*: >30.6 ug/mL (ref >1.3)
Pneumo Ab Type 17 (17F)*: 11.2 ug/mL (ref >1.3)
Pneumo Ab Type 19 (19F)*: 2 ug/mL (ref >1.3)
Pneumo Ab Type 2*: 6 ug/mL (ref >1.3)
Pneumo Ab Type 20*: 9.5 ug/mL (ref >1.3)
Pneumo Ab Type 22 (22F)*: 3 ug/mL (ref >1.3)
Pneumo Ab Type 23 (23F)*: 1.9 ug/mL (ref >1.3)
Pneumo Ab Type 26 (6B)*: 3.1 ug/mL (ref >1.3)
Pneumo Ab Type 3*: 0.4 ug/mL — ABNORMAL LOW (ref >1.3)
Pneumo Ab Type 34 (10A)*: 9.2 ug/mL (ref >1.3)
Pneumo Ab Type 4*: 1.1 ug/mL — ABNORMAL LOW (ref >1.3)
Pneumo Ab Type 43 (11A)*: 1.3 ug/mL — ABNORMAL LOW (ref >1.3)
Pneumo Ab Type 5*: 17.5 ug/mL (ref >1.3)
Pneumo Ab Type 51 (7F)*: 12.3 ug/mL (ref >1.3)
Pneumo Ab Type 54 (15B)*: 2.8 ug/mL (ref >1.3)
Pneumo Ab Type 56 (18C)*: 7.7 ug/mL (ref >1.3)
Pneumo Ab Type 57 (19A)*: 13.3 ug/mL (ref >1.3)
Pneumo Ab Type 68 (9V)*: 2 ug/mL (ref >1.3)
Pneumo Ab Type 70 (33F)*: 2.7 ug/mL (ref >1.3)
Pneumo Ab Type 8*: 16.2 ug/mL (ref >1.3)
Pneumo Ab Type 9 (9N)*: 1.7 ug/mL (ref >1.3)

## 2023-10-06 ENCOUNTER — Encounter: Payer: Self-pay | Admitting: Allergy & Immunology

## 2024-01-01 ENCOUNTER — Other Ambulatory Visit: Payer: Self-pay | Admitting: Allergy & Immunology

## 2024-04-01 ENCOUNTER — Ambulatory Visit: Admitting: Allergy & Immunology

## 2024-04-01 ENCOUNTER — Other Ambulatory Visit: Payer: Self-pay

## 2024-04-01 VITALS — BP 118/78 | HR 82 | Temp 98.1°F | Ht 68.5 in | Wt 171.5 lb

## 2024-04-01 DIAGNOSIS — B999 Unspecified infectious disease: Secondary | ICD-10-CM | POA: Diagnosis not present

## 2024-04-01 DIAGNOSIS — J4540 Moderate persistent asthma, uncomplicated: Secondary | ICD-10-CM | POA: Diagnosis not present

## 2024-04-01 DIAGNOSIS — L299 Pruritus, unspecified: Secondary | ICD-10-CM

## 2024-04-01 DIAGNOSIS — D8940 Mast cell activation, unspecified: Secondary | ICD-10-CM

## 2024-04-01 DIAGNOSIS — J3089 Other allergic rhinitis: Secondary | ICD-10-CM | POA: Diagnosis not present

## 2024-04-01 DIAGNOSIS — J189 Pneumonia, unspecified organism: Secondary | ICD-10-CM

## 2024-04-01 DIAGNOSIS — J302 Other seasonal allergic rhinitis: Secondary | ICD-10-CM

## 2024-04-01 NOTE — Progress Notes (Unsigned)
 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   Subjective:   Cheryl Wright is a 48 y.o. female presenting today for follow up of  Chief Complaint  Patient presents with  . Allergic Rhinitis     Doing well.    Cheryl Wright has a history of the following: Patient Active Problem List   Diagnosis Date Noted  . Positive ANA (antinuclear antibody) 05/12/2023  . Hives 05/12/2023  . Abnormal CT scan of lung 05/12/2023  . Polyarthralgia 11/20/2022  . 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 SVD (spontaneous vaginal delivery) 06/02/2012  . Normal pregnancy 06/01/2012    History obtained from: chart review and {Persons; PED relatives w/patient:19415::patient}.  Discussed the use of AI scribe software for clinical note transcription with the patient and/or guardian, who gave verbal consent to proceed.  Cheryl Wright is a 48 y.o. female presenting for {Blank single:19197::a food challenge,a drug challenge,skin testing,a sick visit,an evaluation of ***,a follow up visit}.  She was last seen in April 2025.  At that time, she had her streptococcal titers rechecked.  For her mouth itching, she continued to avoid tree nuts, strawberries, and tomato.  Her asthma was under good control with Symbicort  160 mcg 2 puffs twice daily and AirSupra  2 puffs every 4-6 hours as needed.  For her rhinitis, we continue with Flonase and Xyzal .  She also remained on Singulair .  Since last visit,  Asthma/Respiratory Symptom History: ***  Allergic Rhinitis Symptom History: ***  Food Allergy  Symptom History:  ***  Skin Symptom History: ***  GERD Symptom History: ***  Infection Symptom History: ***  Her kids are in sixth grade and eighth grades.  The twins are the older ones.  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  She seems to enjoy this a lot.    Otherwise, there have been no changes to her past medical history, surgical history, family history, or social history.    Review of systems otherwise negative other than that mentioned in the HPI.    Objective:   Blood pressure 118/78, pulse 82, temperature 98.1 F (36.7 C), temperature source Temporal, height 5' 8.5 (1.74 m), weight 171 lb 8 oz (77.8 kg). Body mass index is 25.7 kg/m.    Physical Exam   Diagnostic studies: {Blank single:19197::none,deferred due to recent antihistamine use,deferred due to insurance stipulations that require a separate visit for testing,labs sent instead, }  Spirometry: {Blank single:19197::results normal (FEV1: ***%, FVC: ***%, FEV1/FVC: ***%),results abnormal (FEV1: ***%, FVC: ***%, FEV1/FVC: ***%)}.    {Blank single:19197::Spirometry consistent with mild obstructive disease,Spirometry consistent with moderate obstructive disease,Spirometry consistent with severe obstructive disease,Spirometry consistent with possible restrictive disease,Spirometry consistent with mixed obstructive and restrictive disease,Spirometry uninterpretable due to technique,Spirometry consistent with normal pattern}. {Blank single:19197::Albuterol /Atrovent nebulizer,Xopenex/Atrovent nebulizer,Albuterol  nebulizer,Albuterol  four puffs via MDI,Xopenex four puffs via MDI} treatment given in clinic with {Blank single:19197::significant improvement in FEV1 per ATS criteria,significant improvement in FVC per ATS criteria,significant improvement in FEV1 and FVC per ATS  criteria,improvement in FEV1, but not significant per ATS  criteria,improvement in FVC, but not significant per ATS criteria,improvement in FEV1 and FVC, but not significant per ATS criteria,no improvement}.  Allergy  Studies: {Blank single:19197::none,deferred due to recent antihistamine use,deferred due to insurance stipulations that require a separate visit for testing,labs sent instead, }    {Blank single:19197::Allergy  testing results were read and interpreted by myself, documented by clinical staff., }      Marty Shaggy, MD  [facility] 

## 2024-04-01 NOTE — Patient Instructions (Addendum)
 1. Chronic rhinitis - Testing at the last visit showed: grasses, weeds, trees, indoor molds, outdoor molds, and dust mites - Continue with: Flonase Sensimist two sprays per nostril twice daily - Continue with: Xyzal  (levocetirizine) 5mg  tablet once daily and Singulair  (montelukast ) 10mg  daily - Singulair  can cause depression and anxiety, so beware.  - You can use an extra dose of the antihistamine, if needed, for breakthrough symptoms.  - Consider nasal saline rinses 1-2 times daily to remove allergens from the nasal cavities as well as help with mucous clearance (this is especially helpful to do before the nasal sprays are given) - Strongly consider allergy  shots as a means of long-term control. - Allergy  shots re-train and reset the immune system to ignore environmental allergens and decrease the resulting immune response to those allergens (sneezing, itchy watery eyes, runny nose, nasal congestion, etc).    - Allergy  shots improve symptoms in 75-85% of patients.  - Check with your insurance company and call us  when you make a decision.  - CPT codes provided.   2. Recurrent infections - We are going to delve into this a bit more with a lymphocyte enumeration panel and a Streptococcal avidity assay. - This will make sure that you r antibodies are working appropriately.  - We may consider some genetic testing in the future, depending on what these tests show. - We will also do some testing to look for mast cell activation syndrome.  - This involves blood work and urine collection tests.  - These will take some weeks to come back, so be patient.   3. Mouth itching (tree nuts, strawberries, tomato) - Continue to avoid tree nuts, strawberries (fresh), and limit tomato. - You can introduce wheat and we could do the Celiac testing once that is back in your diet.  - Otherwise you were negative to the most common foods.  - I think we can avoid an EpiPen unless you want it.  4. Moderate  persistent asthma, uncomplicated - Lung testing not done today.  - I am not going to make any changes today. - Daily controller medication(s): Symbicort  160/4.42mcg two puffs twice daily with spacer - Prior to physical activity: AirSupra  2 puffs 10-15 minutes before physical activity. - Rescue medications: AirSupra  4 puffs every 4-6 hours as needed - Asthma control goals:  * Full participation in all desired activities (may need albuterol  before activity) * Albuterol  use two time or less a week on average (not counting use with activity) * Cough interfering with sleep two time or less a month * Oral steroids no more than once a year * No hospitalizations  5. Return in about 6 weeks (around 05/13/2024). You can have the follow up appointment with Dr. Iva or a Nurse Practicioner (our Nurse Practitioners are excellent and always have Physician oversight!).    Please inform us  of any Emergency Department visits, hospitalizations, or changes in symptoms. Call us  before going to the ED for breathing or allergy  symptoms since we might be able to fit you in for a sick visit. Feel free to contact us  anytime with any questions, problems, or concerns.  It was a pleasure to see you again today!  Websites that have reliable patient information: 1. American Academy of Asthma, Allergy , and Immunology: www.aaaai.org 2. Food Allergy  Research and Education (FARE): foodallergy.org 3. Mothers of Asthmatics: http://www.asthmacommunitynetwork.org 4. American College of Allergy , Asthma, and Immunology: www.acaai.org      "Like" us  on Facebook and Instagram for our latest updates!  A healthy democracy works best when [employer] participate! Make sure you are registered to vote! If you have moved or changed any of your contact information, you will need to get this updated before voting! Scan the QR codes below to learn more!

## 2024-04-02 ENCOUNTER — Encounter: Payer: Self-pay | Admitting: Allergy & Immunology

## 2024-04-05 NOTE — Addendum Note (Signed)
 Addended by: IVA MARTY SALTNESS on: 04/05/2024 03:07 PM   Modules accepted: Orders

## 2024-04-08 ENCOUNTER — Ambulatory Visit (HOSPITAL_BASED_OUTPATIENT_CLINIC_OR_DEPARTMENT_OTHER)
Admission: RE | Admit: 2024-04-08 | Discharge: 2024-04-08 | Disposition: A | Discharge status: Home / Self Care | Source: Ambulatory Visit | Attending: Pulmonary Disease | Admitting: Pulmonary Disease

## 2024-04-08 DIAGNOSIS — A310 Pulmonary mycobacterial infection: Secondary | ICD-10-CM | POA: Diagnosis present

## 2024-04-08 LAB — STREP PNEUMONIAE 23 SEROTYPES IGG

## 2024-04-08 LAB — SPECIMEN STATUS REPORT

## 2024-04-09 ENCOUNTER — Encounter: Payer: Self-pay | Admitting: Allergy & Immunology

## 2024-04-09 LAB — LYMPH ENUMERATION, BASIC & NK CELLS
% CD 3 Pos. Lymph.: 78.8 % (ref 57.5–86.2)
% CD 4 Pos. Lymph.: 51.2 % (ref 30.8–58.5)
% NK (CD56/16): 4.7 % (ref 1.4–19.4)
Ab NK (CD56/16): 71 /uL (ref 24–406)
Absolute CD 3: 1182 /uL (ref 622–2402)
Absolute CD 4 Helper: 768 /uL (ref 359–1519)
Basophils Absolute: 0.1 x10E3/uL (ref 0.0–0.2)
Basos: 1 %
CD19 % B Cell: 16.8 % (ref 3.3–25.4)
CD19 Abs: 252 /uL (ref 12–645)
CD4/CD8 Ratio: 1.86 (ref 0.92–3.72)
CD8 % Suppressor T Cell: 27.5 % (ref 12.0–35.5)
CD8 T Cell Abs: 413 /uL (ref 109–897)
EOS (ABSOLUTE): 0.2 x10E3/uL (ref 0.0–0.4)
Eos: 2 %
Hematocrit: 43.3 % (ref 34.0–46.6)
Hemoglobin: 14.3 g/dL (ref 11.1–15.9)
Immature Grans (Abs): 0 x10E3/uL (ref 0.0–0.1)
Immature Granulocytes: 0 %
Lymphocytes Absolute: 1.5 x10E3/uL (ref 0.7–3.1)
Lymphs: 18 %
MCH: 34 pg — ABNORMAL HIGH (ref 26.6–33.0)
MCHC: 33 g/dL (ref 31.5–35.7)
MCV: 103 fL — ABNORMAL HIGH (ref 79–97)
Monocytes Absolute: 0.3 x10E3/uL (ref 0.1–0.9)
Monocytes: 4 %
Neutrophils Absolute: 6.2 x10E3/uL (ref 1.4–7.0)
Neutrophils: 75 %
Platelets: 305 x10E3/uL (ref 150–450)
RBC: 4.21 x10E6/uL (ref 3.77–5.28)
RDW: 12.2 % (ref 11.7–15.4)
WBC: 8.3 x10E3/uL (ref 3.4–10.8)

## 2024-04-09 LAB — ALPHA-GAL PANEL
Allergen Lamb IgE: <0.1 kU/L
Beef IgE: <0.1 kU/L
IgE (Immunoglobulin E), Serum: 2 [IU]/mL — ABNORMAL LOW (ref 6–495)
O215-IgE Alpha-Gal: <0.1 kU/L
Pork IgE: <0.1 kU/L

## 2024-04-09 LAB — SEDIMENTATION RATE: Sed Rate: 7 mm/h (ref 0–32)

## 2024-04-09 LAB — ANTINUCLEAR ANTIBODIES, IFA: ANA Titer 1: POSITIVE — AB

## 2024-04-09 LAB — TRYPTASE: Tryptase: 4.8 ug/L (ref 2.2–13.2)

## 2024-04-09 LAB — FANA STAINING PATTERNS: Speckled Pattern: 1:80 {titer}

## 2024-04-09 LAB — ANGIOTENSIN CONVERTING ENZYME: Angio Convert Enzyme: 15 U/L (ref 14–82)

## 2024-04-09 LAB — PROSTAGLANDIN D2, SERUM: Prostaglandin D2, serum: 7.2 pg/mL

## 2024-04-09 LAB — C-REACTIVE PROTEIN: CRP: 8 mg/L (ref 0–10)

## 2024-04-10 ENCOUNTER — Ambulatory Visit (HOSPITAL_BASED_OUTPATIENT_CLINIC_OR_DEPARTMENT_OTHER)

## 2024-04-14 ENCOUNTER — Ambulatory Visit: Payer: Self-pay | Admitting: Pulmonary Disease

## 2024-04-17 LAB — 2,3-DINOR 11BPG F2, 24HR URINE
2,3-dinor 11B-Prostaglandin: <447 pg/mg{creat} (ref <1802)
Creatinine Concentration,24 HR: 70 mg/dL
Creatinine, 24 HR, U: 1435 mg/(24.h) (ref 603–1783)

## 2024-04-17 LAB — N-METHYLHISTAMINE, 24 HR, U
Collection Duration (h): 24 h
Creatinine Concent. 24 Hr, U: 70 mg/dL
Creatinine, 24 Hour, U: 1435 mg/(24.h) (ref 603–1783)
N-Methylhistamine, 24 Hr, U: 94 ug/g{creat} (ref 30–200)
Urine Volume (mL): 2050 mL

## 2024-04-17 LAB — LEUKOTRIENE E4, 24 HR, U
Collection Duration: 24 h
Creatinine Concentration,24 HR: 71 mg/dL
Creatinine, 24 HR, U: 1456 mg/(24.h) (ref 603–1783)
Leukotriene E4, U: 28 pg/mg{creat} (ref <=104)
Urine Volume: 2050 mL

## 2024-04-17 LAB — PROSTAGLANDIN D2/CREATININE, U
Creatinine, Urine: 73 mg/dL
Prostaglandin D2, urine: 6.2 pg/mL
Prostaglandin D2/Cr Ratio: 8.5 ng/g

## 2024-04-20 ENCOUNTER — Ambulatory Visit: Payer: Self-pay | Admitting: Allergy & Immunology

## 2024-04-22 LAB — SPECIMEN STATUS REPORT

## 2024-04-22 LAB — OTHER LAB TEST: PDF: 0

## 2024-05-10 ENCOUNTER — Other Ambulatory Visit: Payer: Self-pay | Admitting: Obstetrics and Gynecology

## 2024-05-10 DIAGNOSIS — Z1231 Encounter for screening mammogram for malignant neoplasm of breast: Secondary | ICD-10-CM

## 2024-05-11 ENCOUNTER — Ambulatory Visit: Admitting: Allergy & Immunology

## 2024-05-11 VITALS — BP 98/64 | HR 70 | Ht 68.0 in | Wt 168.0 lb

## 2024-05-11 DIAGNOSIS — J3089 Other allergic rhinitis: Secondary | ICD-10-CM | POA: Diagnosis not present

## 2024-05-11 DIAGNOSIS — J302 Other seasonal allergic rhinitis: Secondary | ICD-10-CM

## 2024-05-11 DIAGNOSIS — J189 Pneumonia, unspecified organism: Secondary | ICD-10-CM

## 2024-05-11 DIAGNOSIS — D806 Antibody deficiency with near-normal immunoglobulins or with hyperimmunoglobulinemia: Secondary | ICD-10-CM | POA: Diagnosis not present

## 2024-05-11 DIAGNOSIS — L299 Pruritus, unspecified: Secondary | ICD-10-CM

## 2024-05-11 DIAGNOSIS — J4540 Moderate persistent asthma, uncomplicated: Secondary | ICD-10-CM | POA: Diagnosis not present

## 2024-05-11 DIAGNOSIS — J310 Chronic rhinitis: Secondary | ICD-10-CM

## 2024-05-11 MED ORDER — AZITHROMYCIN 500 MG PO TABS: 500.0000 mg | ORAL_TABLET | ORAL | 1 refills | Status: AC

## 2024-05-11 NOTE — Patient Instructions (Addendum)
 1. Chronic rhinitis - Testing at the last visit showed: grasses, weeds, trees, indoor molds, outdoor molds, and dust mites - Continue with: Flonase Sensimist two sprays per nostril twice daily - Continue with: Xyzal  (levocetirizine) 5mg  tablet once daily and Singulair  (montelukast ) 10mg  daily - Singulair  can cause depression and anxiety, so beware.  - You can use an extra dose of the antihistamine, if needed, for breakthrough symptoms.  - Consider nasal saline rinses 1-2 times daily to remove allergens from the nasal cavities as well as help with mucous clearance (this is especially helpful to do before the nasal sprays are given) - Strongly consider allergy  shots as a means of long-term control. - Allergy  shots re-train and reset the immune system to ignore environmental allergens and decrease the resulting immune response to those allergens (sneezing, itchy watery eyes, runny nose, nasal congestion, etc).    - Allergy  shots improve symptoms in 75-85% of patients.   2. Recurrent infections - The Streptococcal avidity assay showed only protection to 4 out of 23 serotypes of Streptococcus pneumonia. 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  - Treatment is prophylactic antibiotics versus immunoglobulin infusions. - I think we should start prophylactic antibiotics with azithromycin three times weekly.  - Read more about this here: gourmetrating.dk, - We may consider some genetic testing in the future, depending on what these tests show.  3. Mouth itching (tree nuts, strawberries, tomato) - Continue to avoid tree nuts, strawberries (fresh) and limit tomato. - Otherwise you were negative to the most common foods.  - I think we can avoid an EpiPen unless you want it.  4. Moderate persistent asthma, uncomplicated - Lung testing not done today.  - I am not going to make any  changes today. - Daily controller medication(s): Symbicort  160/4.57mcg two puffs twice daily with spacer - Prior to physical activity: AirSupra  2 puffs 10-15 minutes before physical activity. - Rescue medications: AirSupra  4 puffs every 4-6 hours as needed - Asthma control goals:  * Full participation in all desired activities (may need albuterol  before activity) * Albuterol  use two time or less a week on average (not counting use with activity) * Cough interfering with sleep two time or less a month * Oral steroids no more than once a year * No hospitalizations  5. Elevated ANA - We are going to refer you to see Dr. Mai at [facility].    6. Return in about 6 months (around 11/08/2024). You can have the follow up appointment with Dr. Iva or a Nurse Practicioner (our Nurse Practitioners are excellent and always have Physician oversight!).    Please inform us  of any Emergency Department visits, hospitalizations, or changes in symptoms. Call us  before going to the ED for breathing or allergy  symptoms since we might be able to fit you in for a sick visit. Feel free to contact us  anytime with any questions, problems, or concerns.  It was a pleasure to see you again today!  Websites that have reliable patient information: 1. American Academy of Asthma, Allergy , and Immunology: www.aaaai.org 2. Food Allergy  Research and Education (FARE): foodallergy.org 3. Mothers of Asthmatics: http://www.asthmacommunitynetwork.org 4. American College of Allergy , Asthma, and Immunology: www.acaai.org      "Like" us  on Facebook and Instagram for our latest updates!      A healthy democracy works best when [employer] participate! Make sure you are registered to vote! If you have moved or changed any of your contact information, you will need to get  this updated before voting! Scan the QR codes below to learn more!

## 2024-05-11 NOTE — Progress Notes (Unsigned)
   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   Subjective:   Cheryl Wright is a 48 y.o. 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  Discussed the use of AI scribe software for clinical note transcription with the patient and/or guardian, who gave verbal consent to proceed.  Cheryl Wright is a 48 y.o. female presenting for {Blank single:19197::a food challenge,a drug challenge,skin testing,a sick visit,an evaluation of ***,a follow up visit}.  Asthma/Respiratory Symptom History: ***  Allergic Rhinitis Symptom History: ***  Food Allergy  Symptom History: ***  Skin Symptom History: ***  GERD Symptom History: ***  Infection Symptom History: ***  Otherwise, there have been no changes to her past medical history, surgical history, family history, or social history.    Review of systems otherwise negative other than that mentioned in the HPI.    Objective:   Blood pressure 98/64, pulse 70, height 5' 8 (1.727 m), weight 168 lb (76.2  kg), SpO2 96%. Body mass index is 25.54 kg/m.    Physical Exam   Diagnostic studies: {Blank single:19197::none,deferred due to recent antihistamine use,deferred due to insurance stipulations that require a separate visit for testing,labs sent instead, }  Spirometry: {Blank single:19197::results normal (FEV1: ***%, FVC: ***%, FEV1/FVC: ***%),results abnormal (FEV1: ***%, FVC: ***%, FEV1/FVC: ***%)}.    {Blank single:19197::Spirometry consistent with mild obstructive disease,Spirometry consistent with moderate obstructive disease,Spirometry consistent with severe obstructive disease,Spirometry consistent with possible restrictive disease,Spirometry consistent with mixed obstructive and restrictive disease,Spirometry uninterpretable due to technique,Spirometry consistent with normal pattern}. {Blank single:19197::Albuterol /Atrovent nebulizer,Xopenex/Atrovent nebulizer,Albuterol  nebulizer,Albuterol  four puffs via MDI,Xopenex four puffs via MDI} treatment given in clinic with {Blank single:19197::significant improvement in FEV1 per ATS criteria,significant improvement in FVC per ATS criteria,significant improvement in FEV1 and FVC per ATS criteria,improvement in FEV1, but not significant per ATS criteria,improvement in FVC, but not significant per ATS criteria,improvement in FEV1 and FVC, but not significant per ATS criteria,no improvement}.  Allergy  Studies: {Blank single:19197::none,deferred due to recent antihistamine use,deferred due to insurance stipulations that require a separate visit for testing,labs sent instead, }    {Blank single:19197::Allergy  testing results were read and interpreted by myself, documented by clinical staff., }      Marty Shaggy, MD  [facility]

## 2024-05-13 ENCOUNTER — Encounter: Payer: Self-pay | Admitting: Allergy & Immunology

## 2024-05-14 ENCOUNTER — Ambulatory Visit (HOSPITAL_BASED_OUTPATIENT_CLINIC_OR_DEPARTMENT_OTHER): Admitting: Pulmonary Disease

## 2024-05-14 ENCOUNTER — Encounter (HOSPITAL_BASED_OUTPATIENT_CLINIC_OR_DEPARTMENT_OTHER): Payer: Self-pay | Admitting: Pulmonary Disease

## 2024-05-14 VITALS — BP 113/72 | HR 86 | Ht 68.0 in | Wt 168.4 lb

## 2024-05-14 DIAGNOSIS — A319 Mycobacterial infection, unspecified: Secondary | ICD-10-CM | POA: Insufficient documentation

## 2024-05-14 DIAGNOSIS — U099 Post covid-19 condition, unspecified: Secondary | ICD-10-CM

## 2024-05-14 DIAGNOSIS — Z8709 Personal history of other diseases of the respiratory system: Secondary | ICD-10-CM

## 2024-05-14 DIAGNOSIS — R918 Other nonspecific abnormal finding of lung field: Secondary | ICD-10-CM | POA: Diagnosis not present

## 2024-05-14 DIAGNOSIS — R21 Rash and other nonspecific skin eruption: Secondary | ICD-10-CM | POA: Diagnosis not present

## 2024-05-14 DIAGNOSIS — J42 Unspecified chronic bronchitis: Secondary | ICD-10-CM | POA: Diagnosis not present

## 2024-05-14 DIAGNOSIS — Z87891 Personal history of nicotine dependence: Secondary | ICD-10-CM

## 2024-05-14 NOTE — Patient Instructions (Addendum)
 MAI/Atypical infection on CT Clinically low suspicion for this however will need to rule out prior to starting chronic azithromycin  --Reviewed CT Chest 04/08/24. ORDER CT chest without contrast in 1 year.  If abnormalities persist/worsen will arrange sooner scan --Plan for flexible bronchoscopy 12/3 or 12/4 at [facility]   Post-covid long hauler  Chronic bronchitis --CONTINUE Symbicort  160-4.5 mcg TWO puffs in the morning and evening. Rinse mouth out after use to prevent thrush.  --CONTINUE Airsupra  1-2 puffs AS NEEDED every 4 hours. REFILL --CONTINUE regular aerobic exercise  --Previously followed by [facility] [employer]

## 2024-05-14 NOTE — H&P (View-Only) (Signed)
 Subjective:   PATIENT ID: Cheryl Wright GENDER: female DOB: 08-31-1975, MRN: [id number]  Chief Complaint  Patient presents with   Follow-up    MAI    Reason for Visit: Follow-up  Cheryl Wright is a 48 year old female former smoker with endometriosis, migraine, seasonal allergies and GAD who presents for follow-up for covid long hauler.  Initial consult She has had covid in September 2023 and again in December 2023. Her covid symptoms were improving until end of December when she began having more productive cough and left pleuritic chest pain. She was treated with Septra  and albuterol  inhaler 07/10/22 and improved cough but has worsening shortness of breath and congestion. Tested positive for flu B on 07/18/22. Stopped taking Septra  after taking 7 days. Took tamiflu and completed it. And took levaquin when CXR showed possible pneumonia  She had a CT Chest on 07/30/22 with impression: 1. Reticulonodular and tree in bud type opacities in right middle lobe suspected be infectious or inflammatory in etiology. Consider atypical etiologies including MAI. 2. Several small lung nodules as detailed, 5 mm and less. No follow-up needed if patient is low risk. CXR report on 07/18/21 with interstitial changes at the bases however resolved on follow-up CXR 07/29/22.  She reports her nasal congestion and productive cough that worsened in the beginning of January. Went on a ski trip and felt sick during the trip. Worsening shortness of breath. After finishing her antibiotics she is laying in bed and feeling fatigued. She was evaluated in ED on 2/5. Treated with IV fluids, steroids. Has completed steroids but she feels it was not effective.  At baseline she reports she does have wheezing and cough when exposed to outdoor allergens. Uses saline nasal solution. Associated with chest tightness. Now she feels like she has chronic symptoms. Denies childhood asthma. Albuterol  initially helped but does not feel  like albuterol  inhaler anymore due to head spinning. Cough is less productive. Has nasal congestion. Has wheezing/whistle with laying down.  09/17/22 Since our last visit she has started pulmonary rehab. Has received one round steroids ordered on 08/16/22. No further steroids. She reports shortness of breath has improved. Still intermittently feeling fatigued, shortness of breath and chest pain. Albuterol  use is variable. At least 1-2 times daily. Husband presents chart of her chronic chest pain, shortness of breath and fatigue which he reports can be subjective but notes she is overall feeling better.  During exercise at rehab she has had systolic BPs in the upper 80s.associated with dizziness. She reports good hydration and currently normotensive in clinic today.  11/15/22 She has completed pulmonary rehab and planning to start PREP program in June. She felt the program was beneficial. Her energy has improved but sometimes feel fatigued after exercising. Improved shortness of breath overall but still difficulty with increased humidity. Recent congestion in the last week. Some nasal drainage with dry cough. Occasionally uses airsupra .  02/18/23 Since our last visit she took prednisone  course in June and July for cough and wheezing. Still coughing at night. Reports left chest pain for several months that hasn't changed. Denies recent fevers and chills. Has been off nasal sprays for some time and restarted this. She reports feeling more congested. Has been compliant with Symbicort  and airsupura 2-3 times a week before exercise. Exercising regularly.  05/21/23 Since our last visit her end of summer symptoms improved but worsened in the last few weeks due to suspected allergies. Cough seems to occur in in the morning  and evenings. Has been compliant with Symbicort  except when she went to her Rheumatologist and held the inhaler. Her shortness of breath worsened around this time. Uses air supra once a day with  exercise. Denies wheezing.  05/26/23 Since our last visit she reports no changes to her respiratory symptoms and well controlled on Symbicort . When previously off inhaler she has worsening shortness of breath. No cough or wheezing. Uses rescue inhaler prior to exercise. She is planned to see Allergy  in January for rashes. Presents to discuss CT results.  05/14/24 Since our last visit she has worsening cough this week but previously controlled the month before. Compliant with Symbicort . Recently advised to start chronic macrolide MWF for hx of recurrent infections. Has not yet started. She is followed by Allergy  with Dr. 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  Social History: Social smoker <10 years weekly. <1/2 ppd. Quit in 2202 Second hand smoke exposure  Past Medical History:  Diagnosis Date   Angio-edema    Asthma    Blood transfusion without reported diagnosis    pp hemorrhage 2011 twin delivery   Endometriosis    stage four   Frequent UTI    Headache(784.0)    migraine   Hx of preeclampsia, prior pregnancy, currently pregnant    Long COVID    Patient states caused lung damage   Normal pregnancy 06/01/2012   Recurrent upper respiratory infection (URI)    SVD (spontaneous vaginal delivery) 06/02/2012   Urticaria      Family History  Problem Relation Age of Onset   Urticaria Mother    Asthma Mother    Angioedema Mother    Allergic rhinitis Mother    Hyperlipidemia Mother    Hypertension Mother    Osteoporosis Mother    Osteoarthritis Mother    Angioedema Father    Allergic rhinitis Father    Rheum arthritis Father    Lung cancer Father        mesothelioma from [employer]   Allergic rhinitis Brother    Stroke Maternal Grandmother    Allergic rhinitis Paternal Grandmother    Breast cancer Paternal Grandmother 20    Colon cancer Paternal Grandmother 48   Allergic rhinitis Paternal Grandfather    Lung cancer Paternal Grandfather        dx in his 23s; smoker   Ovarian cancer Other        paternal grandfather's 2 sisters and mother in their 23s-60s   Breast cancer Other        father's paternal first cousin dx in her 29s   Breast cancer Other        paternal grandmother's mother dx in her 4s     Social History   Occupational History   Not on file  Tobacco Use   Smoking status: Former    Types: Cigarettes    Passive exposure: Past   Smokeless tobacco: Never  Vaping Use   Vaping status: Never Used  Substance and Sexual Activity   Alcohol use: Yes    Comment: rarely   Drug use: No   Sexual activity: Yes    Comment: preg    Allergies  Allergen Reactions   Penicillins Anaphylaxis, Hives and Swelling    Childhood reaction     Outpatient Medications Prior to Visit  Medication Sig Dispense Refill   Acetaminophen  (TYLENOL  PO) Take by mouth.     albuterol  (  PROVENTIL ) (2.5 MG/3ML) 0.083% nebulizer solution Take 3 mLs (2.5 mg total) by nebulization every 6 (six) hours as needed for wheezing or shortness of breath. 75 mL 2   Albuterol -Budesonide  (AIRSUPRA ) 90-80 MCG/ACT AERO Inhale 2 puffs into the lungs every 4 (four) hours as needed. 10.7 g 5   azithromycin  (ZITHROMAX ) 500 MG tablet Take 1 tablet (500 mg total) by mouth every Monday, Wednesday, and Friday. 36 tablet 1   budesonide -formoterol  (SYMBICORT ) 160-4.5 MCG/ACT inhaler Inhale 2 puffs into the lungs in the morning and at bedtime. 10.2 each 11   levocetirizine (XYZAL ) 5 MG tablet TAKE 1 TABLET BY MOUTH EVERY DAY IN THE EVENING 90 tablet 1   montelukast  (SINGULAIR ) 10 MG tablet TAKE 1 TABLET BY MOUTH EVERYDAY AT BEDTIME 90 tablet 1   sertraline (ZOLOFT) 50 MG tablet Take 50 mg by mouth daily.     triamcinolone  ointment (KENALOG ) 0.1 % Apply 1 Application topically 2 (two) times daily. 80 g 5   VIENVA 0.1-20 MG-MCG tablet Take 1 tablet by  mouth daily.     No facility-administered medications prior to visit.    Review of Systems  Constitutional:  Negative for chills, diaphoresis, fever, malaise/fatigue and weight loss.  HENT:  Negative for congestion.   Respiratory:  Positive for cough. Negative for hemoptysis, sputum production, shortness of breath and wheezing.   Cardiovascular:  Negative for chest pain, palpitations and leg swelling.     Objective:   Vitals:   05/14/24 1030  BP: 113/72  Pulse: 86  SpO2: 96%  Weight: 168 lb 6.4 oz (76.4 kg)  Height: 5' 8 (1.727 m)    SpO2: 96 % Physical Exam: General: Well-appearing, no acute distress HENT: [place], AT Eyes: EOMI, no scleral icterus Respiratory: Clear to auscultation bilaterally.  No crackles, wheezing or rales Cardiovascular: RRR, -M/R/G, no JVD Extremities:-Edema,-tenderness Neuro: AAO x4, CNII-XII grossly intact Psych: Normal mood, normal affect  Data Reviewed:  Imaging: CT Chest 07/30/22 1. Reticulonodular and tree-in-bud type opacities noted in the right  middle lobe suspected to be infectious or inflammatory in etiology.  Consider atypical etiologies including MAI.  2. Several small lung nodules as detailed, 5 mm and less. No  follow-up needed if patient is low-risk (and has no known or  suspected primary neoplasm).   CT Chest 02/03/23 - RML with reticulonodular and tree in bud opacities  CT Chest 05/12/23 - Minimal with minimal scattered mucoid impaction that is unchanged. No pulmonary nodules.  CT Chest 04/08/24 - Unchanged scattered pulmonary nodules and tree-in bud including in RML. No evidence of new or worsening atypical infection  PFT: 09/17/22 FVC 4.43 (107%) FEV1 3.19 (97%) Ratio 72  TLC 124% DLCO 98% Interpretation: Normal spirometry. Mild hyperinflation and air trapping. Normal gas exchange.  Labs: CBC    Component Value Date/Time   WBC 8.3 04/01/2024 1104   WBC 10.6 (H) 07/29/2022 1425   RBC 4.21 04/01/2024 1104   RBC 4.26  07/29/2022 1425   HGB 14.3 04/01/2024 1104   HCT 43.3 04/01/2024 1104   PLT 305 04/01/2024 1104   MCV 103 (H) 04/01/2024 1104   MCH 34.0 (H) 04/01/2024 1104   MCH 33.6 07/29/2022 1425   MCHC 33.0 04/01/2024 1104   MCHC 34.6 07/29/2022 1425   RDW 12.2 04/01/2024 1104   LYMPHSABS 1.5 04/01/2024 1104   EOSABS 0.2 04/01/2024 1104   BASOSABS 0.1 04/01/2024 1104   Abs eos 07/18/22 100     Assessment & Plan:   Discussion: 48 year old  female former smoker with endometriosis, migraine, seasonal allergies and GAD who presents for 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 Overall improving stamina and symptoms after graduating pulmonary rehab and well controlled on Symbicort . Reviewed CT 04/2023 with unchanged nodularity and mucoid impaction in RML. Discussed low yield on bronchoscopy based on current minimal findings on CT but offered as an option in future in case symptoms worsen.  MAI/Atypical infection on CT Clinically low suspicion for this however will need to rule out prior to starting chronic azithromycin  --Reviewed CT Chest 04/08/24. ORDER CT chest without contrast in 1 year.  If abnormalities persist/worsen will arrange sooner scan --Plan for flexible bronchoscopy 12/3 or 12/4 at [facility]   Post-covid long hauler  Chronic bronchitis --CONTINUE Symbicort  160-4.5 mcg TWO puffs in the morning and evening. Rinse mouth out after use to prevent thrush.  --CONTINUE Airsupra  1-2 puffs AS NEEDED every 4 hours. REFILL --CONTINUE regular aerobic exercise  --Previously followed by [facility] Covid program  Hx recurrent infections Rash --Followed by Allergy  since 06/2023. 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 Will obtain bronchoscopy as noted above before starting  Health Maintenance Immunization History  Administered Date(s) Administered   Influenza Split 03/22/2014   Influenza,inj,Quad  PF,6+ Mos 05/23/2021, 05/24/2022   PFIZER(Purple Top)SARS-COV-2 Vaccination 09/10/2019, 10/05/2019, 05/25/2020   Pfizer Covid-19 Vaccine Bivalent Booster 15yrs & up 07/14/2021   Pfizer(Comirnaty)Fall Seasonal Vaccine 12 years and older 05/08/2023, 03/23/2024   Tdap 03/26/2012, 05/24/2022   CT Lung Screen - not qualifed  Orders Placed This Encounter  Procedures   Procedural/ Surgical Case Request: BRONCHOSCOPY, FLEXIBLE    Standing Status:   Future    Expiration Date:   05/14/2025    Pre-op diagnosis:   MAC r/o   CT Chest Wo Contrast    Standing Status:   Future    Expiration Date:   05/14/2025    Scheduling Instructions:     Schedule in 03/2025 with pulmonary follow-up Red)    Is patient pregnant?:   No    Preferred imaging location?:   [facility]   Ambulatory referral to Pulmonology    Referral Priority:   Routine    Referral Type:   Consultation    Referral Reason:   Specialty Services Required    Requested Specialty:   Pulmonary Disease    Number of Visits Requested:   1   No orders of the defined types were placed in this encounter.   No follow-ups on file. Next October after CT  I have spent a total time of 32-minutes on the day of the appointment including chart review, data review, collecting history, coordinating care and discussing medical diagnosis and plan with the patient/family. Past medical history, allergies, medications were reviewed. Pertinent imaging, labs and tests included in this note have been reviewed and interpreted independently by me.  Rivers Gassmann Slater Staff, MD [facility] 05/14/2024 11:17 AM

## 2024-05-14 NOTE — Progress Notes (Unsigned)
 Subjective:   PATIENT ID: Cheryl Wright GENDER: female DOB: April 08, 1976, MRN: [id number]  Chief Complaint  Patient presents with   Follow-up    MAI    Reason for Visit: Follow-up  Ms. Cheryl Wright is a 48 year old female former smoker with endometriosis, migraine, seasonal allergies and GAD who presents for follow-up for covid long hauler.  Initial consult She has had covid in September 2023 and again in December 2023. Her covid symptoms were improving until end of December when she began having more productive cough and left pleuritic chest pain. She was treated with Septra  and albuterol  inhaler 07/10/22 and improved cough but has worsening shortness of breath and congestion. Tested positive for flu B on 07/18/22. Stopped taking Septra  after taking 7 days. Took tamiflu and completed it. And took levaquin when CXR showed possible pneumonia  She had a CT Chest on 07/30/22 with impression: 1. Reticulonodular and tree in bud type opacities in right middle lobe suspected be infectious or inflammatory in etiology. Consider atypical etiologies including MAI. 2. Several small lung nodules as detailed, 5 mm and less. No follow-up needed if patient is low risk. CXR report on 07/18/21 with interstitial changes at the bases however resolved on follow-up CXR 07/29/22.  She reports her nasal congestion and productive cough that worsened in the beginning of January. Went on a ski trip and felt sick during the trip. Worsening shortness of breath. After finishing her antibiotics she is laying in bed and feeling fatigued. She was evaluated in ED on 2/5. Treated with IV fluids, steroids. Has completed steroids but she feels it was not effective.  At baseline she reports she does have wheezing and cough when exposed to outdoor allergens. Uses saline nasal solution. Associated with chest tightness. Now she feels like she has chronic symptoms. Denies childhood asthma. Albuterol  initially helped but does not feel  like albuterol  inhaler anymore due to head spinning. Cough is less productive. Has nasal congestion. Has wheezing/whistle with laying down.  09/17/22 Since our last visit she has started pulmonary rehab. Has received one round steroids ordered on 08/16/22. No further steroids. She reports shortness of breath has improved. Still intermittently feeling fatigued, shortness of breath and chest pain. Albuterol  use is variable. At least 1-2 times daily. Husband presents chart of her chronic chest pain, shortness of breath and fatigue which he reports can be subjective but notes she is overall feeling better.  During exercise at rehab she has had systolic BPs in the upper 80s.associated with dizziness. She reports good hydration and currently normotensive in clinic today.  11/15/22 She has completed pulmonary rehab and planning to start PREP program in June. She felt the program was beneficial. Her energy has improved but sometimes feel fatigued after exercising. Improved shortness of breath overall but still difficulty with increased humidity. Recent congestion in the last week. Some nasal drainage with dry cough. Occasionally uses airsupra .  02/18/23 Since our last visit she took prednisone  course in June and July for cough and wheezing. Still coughing at night. Reports left chest pain for several months that hasn't changed. Denies recent fevers and chills. Has been off nasal sprays for some time and restarted this. She reports feeling more congested. Has been compliant with Symbicort  and airsupura 2-3 times a week before exercise. Exercising regularly.  05/21/23 Since our last visit her end of summer symptoms improved but worsened in the last few weeks due to suspected allergies. Cough seems to occur in in the morning  and evenings. Has been compliant with Symbicort  except when she went to her Rheumatologist and held the inhaler. Her shortness of breath worsened around this time. Uses air supra once a day with  exercise. Denies wheezing.  05/26/23 Since our last visit she reports no changes to her respiratory symptoms and well controlled on Symbicort . When previously off inhaler she has worsening shortness of breath. No cough or wheezing. Uses rescue inhaler prior to exercise. She is planned to see Allergy  in January for rashes. Presents to discuss CT results.  05/14/24 Since our last visit she has worsening cough this week but previously controlled the month before. Compliant with Symbicort . Recently advised to start chronic macrolide MWF for hx of recurrent infections. She is followed by Allergy  with Dr. 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  Social History: Social smoker <10 years weekly. <1/2 ppd. Quit in 2202 Second hand smoke exposure  Past Medical History:  Diagnosis Date   Angio-edema    Asthma    Blood transfusion without reported diagnosis    pp hemorrhage 2011 twin delivery   Endometriosis    stage four   Frequent UTI    Headache(784.0)    migraine   Hx of preeclampsia, prior pregnancy, currently pregnant    Long COVID    Patient states caused lung damage   Normal pregnancy 06/01/2012   Recurrent upper respiratory infection (URI)    SVD (spontaneous vaginal delivery) 06/02/2012   Urticaria      Family History  Problem Relation Age of Onset   Urticaria Mother    Asthma Mother    Angioedema Mother    Allergic rhinitis Mother    Hyperlipidemia Mother    Hypertension Mother    Osteoporosis Mother    Osteoarthritis Mother    Angioedema Father    Allergic rhinitis Father    Rheum arthritis Father    Lung cancer Father        mesothelioma from [employer]   Allergic rhinitis Brother    Stroke Maternal Grandmother    Allergic rhinitis Paternal Grandmother    Breast cancer Paternal Grandmother 23   Colon cancer Paternal  Grandmother 61   Allergic rhinitis Paternal Grandfather    Lung cancer Paternal Grandfather        dx in his 33s; smoker   Ovarian cancer Other        paternal grandfather's 2 sisters and mother in their 70s-60s   Breast cancer Other        father's paternal first cousin dx in her 80s   Breast cancer Other        paternal grandmother's mother dx in her 58s     Social History   Occupational History   Not on file  Tobacco Use   Smoking status: Former    Types: Cigarettes    Passive exposure: Past   Smokeless tobacco: Never  Vaping Use   Vaping status: Never Used  Substance and Sexual Activity   Alcohol use: Yes    Comment: rarely   Drug use: No   Sexual activity: Yes    Comment: preg    Allergies  Allergen Reactions   Penicillins Anaphylaxis, Hives and Swelling    Childhood reaction     Outpatient Medications Prior to Visit  Medication Sig Dispense Refill   Acetaminophen  (TYLENOL  PO) Take by mouth.     albuterol  (PROVENTIL ) (2.5 MG/3ML) 0.083%  nebulizer solution Take 3 mLs (2.5 mg total) by nebulization every 6 (six) hours as needed for wheezing or shortness of breath. 75 mL 2   Albuterol -Budesonide  (AIRSUPRA ) 90-80 MCG/ACT AERO Inhale 2 puffs into the lungs every 4 (four) hours as needed. 10.7 g 5   azithromycin  (ZITHROMAX ) 500 MG tablet Take 1 tablet (500 mg total) by mouth every Monday, Wednesday, and Friday. 36 tablet 1   budesonide -formoterol  (SYMBICORT ) 160-4.5 MCG/ACT inhaler Inhale 2 puffs into the lungs in the morning and at bedtime. 10.2 each 11   levocetirizine (XYZAL ) 5 MG tablet TAKE 1 TABLET BY MOUTH EVERY DAY IN THE EVENING 90 tablet 1   montelukast  (SINGULAIR ) 10 MG tablet TAKE 1 TABLET BY MOUTH EVERYDAY AT BEDTIME 90 tablet 1   sertraline (ZOLOFT) 50 MG tablet Take 50 mg by mouth daily.     triamcinolone  ointment (KENALOG ) 0.1 % Apply 1 Application topically 2 (two) times daily. 80 g 5   VIENVA 0.1-20 MG-MCG tablet Take 1 tablet by mouth daily.     No  facility-administered medications prior to visit.    Review of Systems  Constitutional:  Negative for chills, diaphoresis, fever, malaise/fatigue and weight loss.  HENT:  Negative for congestion.   Respiratory:  Negative for cough, hemoptysis, sputum production, shortness of breath and wheezing.   Cardiovascular:  Negative for chest pain, palpitations and leg swelling.     Objective:   Vitals:   05/14/24 1030  BP: 113/72  Pulse: 86  SpO2: 96%  Weight: 168 lb 6.4 oz (76.4 kg)  Height: 5' 8 (1.727 m)    SpO2: 96 % Physical Exam: General: Well-appearing, no acute distress HENT: , AT Eyes: EOMI, no scleral icterus Respiratory: ***Clear to auscultation bilaterally.  No crackles, wheezing or rales Cardiovascular: RRR, -M/R/G, no JVD Extremities:-Edema,-tenderness Neuro: AAO x4, CNII-XII grossly intact Psych: Normal mood, normal affect  Data Reviewed:  Imaging: CT Chest 07/30/22 1. Reticulonodular and tree-in-bud type opacities noted in the right  middle lobe suspected to be infectious or inflammatory in etiology.  Consider atypical etiologies including MAI.  2. Several small lung nodules as detailed, 5 mm and less. No  follow-up needed if patient is low-risk (and has no known or  suspected primary neoplasm).   CT Chest 02/03/23 - RML with reticulonodular and tree in bud opacities  CT Chest 05/12/23 - Minimal with minimal scattered mucoid impaction that is unchanged. No pulmonary nodules.  CT Chest 04/08/24 - Unchanged scattered pulmonary nodules and tree-in bud including in RML. No evidence of new or worsening atypical infection  PFT: 09/17/22 FVC 4.43 (107%) FEV1 3.19 (97%) Ratio 72  TLC 124% DLCO 98% Interpretation: Normal spirometry. Mild hyperinflation and air trapping. Normal gas exchange.  Labs: CBC    Component Value Date/Time   WBC 8.3 04/01/2024 1104   WBC 10.6 (H) 07/29/2022 1425   RBC 4.21 04/01/2024 1104   RBC 4.26 07/29/2022 1425   HGB 14.3  04/01/2024 1104   HCT 43.3 04/01/2024 1104   PLT 305 04/01/2024 1104   MCV 103 (H) 04/01/2024 1104   MCH 34.0 (H) 04/01/2024 1104   MCH 33.6 07/29/2022 1425   MCHC 33.0 04/01/2024 1104   MCHC 34.6 07/29/2022 1425   RDW 12.2 04/01/2024 1104   LYMPHSABS 1.5 04/01/2024 1104   EOSABS 0.2 04/01/2024 1104   BASOSABS 0.1 04/01/2024 1104   Abs eos 07/18/22 100     Assessment & Plan:   Discussion: 48 year old female former smoker with endometriosis, migraine,  seasonal allergies and GAD who presents for follow-up for covid long hauler. Overall improving stamina and symptoms after graduating pulmonary rehab and well controlled on Symbicort . Reviewed CT 04/2023 with unchanged nodularity and mucoid impaction in RML. Discussed low yield on bronchoscopy based on current minimal findings on CT but offered as an option in future in case symptoms worsen.  MAI/Atypical infection on CT Clinically low suspicion for this however will need to rule out prior to starting chronic azithromycin  --Reviewed CT Chest 04/08/24. ORDER CT chest without contrast in 1 year.  If abnormalities persist/worsen will arrange sooner scan --Plan for flexible bronchoscopy 12/3 or 12/4 at [facility]   Post-covid long hauler  Chronic bronchitis --CONTINUE Symbicort  160-4.5 mcg TWO puffs in the morning and evening. Rinse mouth out after use to prevent thrush.  --CONTINUE Airsupra  1-2 puffs AS NEEDED every 4 hours. REFILL --CONTINUE regular aerobic exercise  --Previously [facility] by [facility] Covid program  Hx recurrent infections Rash --Followed by Allergy  since 06/2023  Health Maintenance Immunization History  Administered Date(s) Administered   Influenza Split 03/22/2014   Influenza,inj,Quad PF,6+ Mos 05/23/2021, 05/24/2022   PFIZER(Purple Top)SARS-COV-2 Vaccination 09/10/2019, 10/05/2019, 05/25/2020   Pfizer Covid-19 Vaccine Bivalent Booster 52yrs & up 07/14/2021   Pfizer(Comirnaty)Fall Seasonal Vaccine 12 years and older  05/08/2023, 03/23/2024   Tdap 03/26/2012, 05/24/2022   CT Lung Screen - not qualifed  Orders Placed This Encounter  Procedures   Procedural/ Surgical Case Request: BRONCHOSCOPY, FLEXIBLE    Standing Status:   Future    Expiration Date:   05/14/2025    Pre-op diagnosis:   MAC r/o   No orders of the defined types were placed in this encounter.   No follow-ups on file.   I have spent a total time of***-minutes on the day of the appointment including chart review, data review, collecting history, coordinating care and discussing medical diagnosis and plan with the patient/family. Past medical history, allergies, medications were reviewed. Pertinent imaging, labs and tests included in this note have been reviewed and interpreted independently by me.  Kaniel Kiang Slater Staff, MD  Pulmonary Critical Care 05/14/2024 11:17 AM

## 2024-05-17 ENCOUNTER — Encounter: Payer: Self-pay | Admitting: Pulmonary Disease

## 2024-05-26 DIAGNOSIS — A319 Mycobacterial infection, unspecified: Secondary | ICD-10-CM | POA: Insufficient documentation

## 2024-05-27 ENCOUNTER — Ambulatory Visit (HOSPITAL_COMMUNITY)
Admission: RE | Admit: 2024-05-27 | Discharge: 2024-05-27 | Disposition: A | Attending: Pulmonary Disease | Admitting: Pulmonary Disease

## 2024-05-27 ENCOUNTER — Ambulatory Visit (HOSPITAL_COMMUNITY): Payer: Self-pay

## 2024-05-27 ENCOUNTER — Encounter (HOSPITAL_COMMUNITY): Payer: Self-pay | Admitting: Pulmonary Disease

## 2024-05-27 ENCOUNTER — Other Ambulatory Visit: Payer: Self-pay

## 2024-05-27 ENCOUNTER — Encounter (HOSPITAL_COMMUNITY): Admission: RE | Disposition: A | Payer: Self-pay | Source: Home / Self Care | Attending: Pulmonary Disease

## 2024-05-27 DIAGNOSIS — Z7951 Long term (current) use of inhaled steroids: Secondary | ICD-10-CM | POA: Diagnosis not present

## 2024-05-27 DIAGNOSIS — A319 Mycobacterial infection, unspecified: Secondary | ICD-10-CM | POA: Insufficient documentation

## 2024-05-27 DIAGNOSIS — J45909 Unspecified asthma, uncomplicated: Secondary | ICD-10-CM | POA: Diagnosis not present

## 2024-05-27 DIAGNOSIS — Z87891 Personal history of nicotine dependence: Secondary | ICD-10-CM | POA: Diagnosis not present

## 2024-05-27 HISTORY — PX: BRONCHIAL WASHINGS: SHX5105

## 2024-05-27 HISTORY — PX: FLEXIBLE BRONCHOSCOPY: SHX5094

## 2024-05-27 LAB — BODY FLUID CELL COUNT WITH DIFFERENTIAL
Eos, Fluid: 0 %
Eos, Fluid: 1 %
Lymphs, Fluid: 36 %
Lymphs, Fluid: 48 %
Monocyte-Macrophage-Serous Fluid: 43 % — ABNORMAL LOW (ref 50–90)
Monocyte-Macrophage-Serous Fluid: 47 % — ABNORMAL LOW (ref 50–90)
Neutrophil Count, Fluid: 21 % (ref 0–25)
Neutrophil Count, Fluid: 4 % (ref 0–25)
Total Nucleated Cell Count, Fluid: 223 uL (ref 0–1000)
Total Nucleated Cell Count, Fluid: 445 uL (ref 0–1000)

## 2024-05-27 SURGERY — BRONCHOSCOPY, FLEXIBLE
Anesthesia: General | Laterality: Bilateral

## 2024-05-27 MED ORDER — LIDOCAINE 2% (20 MG/ML) 5 ML SYRINGE
INTRAMUSCULAR | Status: DC | PRN
  Administered 2024-05-27: 80 mg via INTRAVENOUS

## 2024-05-27 MED ORDER — FENTANYL CITRATE (PF) 250 MCG/5ML IJ SOLN
INTRAMUSCULAR | Status: DC | PRN
  Administered 2024-05-27: 100 ug via INTRAVENOUS

## 2024-05-27 MED ORDER — FENTANYL CITRATE (PF) 100 MCG/2ML IJ SOLN
INTRAMUSCULAR | Status: AC
  Filled 2024-05-27: qty 2

## 2024-05-27 MED ORDER — CHLORHEXIDINE GLUCONATE 0.12 % MT SOLN
15.0000 mL | Freq: Once | OROMUCOSAL | Status: AC
  Administered 2024-05-27: 15 mL via OROMUCOSAL

## 2024-05-27 MED ORDER — SUGAMMADEX SODIUM 200 MG/2ML IV SOLN
INTRAVENOUS | Status: DC | PRN
  Administered 2024-05-27: 200 mg via INTRAVENOUS

## 2024-05-27 MED ORDER — DEXAMETHASONE SOD PHOSPHATE PF 10 MG/ML IJ SOLN
INTRAMUSCULAR | Status: DC | PRN
  Administered 2024-05-27: 10 mg via INTRAVENOUS

## 2024-05-27 MED ORDER — ONDANSETRON HCL 4 MG/2ML IJ SOLN
INTRAMUSCULAR | Status: DC | PRN
  Administered 2024-05-27: 4 mg via INTRAVENOUS

## 2024-05-27 MED ORDER — SODIUM CHLORIDE 0.9 % IV SOLN: INTRAVENOUS | Status: DC | PRN

## 2024-05-27 MED ORDER — ROCURONIUM BROMIDE 10 MG/ML (PF) SYRINGE
PREFILLED_SYRINGE | INTRAVENOUS | Status: DC | PRN
  Administered 2024-05-27: 30 mg via INTRAVENOUS

## 2024-05-27 MED ORDER — CHLORHEXIDINE GLUCONATE 0.12 % MT SOLN
OROMUCOSAL | Status: AC
  Filled 2024-05-27: qty 15

## 2024-05-27 MED ORDER — PROPOFOL 10 MG/ML IV BOLUS
INTRAVENOUS | Status: DC | PRN
  Administered 2024-05-27: 120 mg via INTRAVENOUS
  Administered 2024-05-27: 200 ug/kg/min via INTRAVENOUS

## 2024-05-27 NOTE — Anesthesia Preprocedure Evaluation (Signed)
 Anesthesia Evaluation  Patient identified by MRN, date of birth, ID band Patient awake    Reviewed: Allergy  & Precautions, NPO status , Patient's Chart, lab work & pertinent test results  History of Anesthesia Complications Negative for: history of anesthetic complications  Airway Mallampati: II  TM Distance: >3 FB Neck ROM: Full    Dental no notable dental hx. (+) Teeth Intact   Pulmonary asthma , neg sleep apnea, neg COPD, Patient abstained from smoking.Not current smoker, former smoker Long covid   Pulmonary exam normal breath sounds clear to auscultation       Cardiovascular Exercise Tolerance: Good METS(-) hypertension(-) CAD and (-) Past MI negative cardio ROS (-) dysrhythmias  Rhythm:Regular Rate:Normal - Systolic murmurs    Neuro/Psych  Headaches  negative psych ROS   GI/Hepatic ,neg GERD  ,,(+)     (-) substance abuse    Endo/Other  neg diabetes    Renal/GU negative Renal ROS     Musculoskeletal   Abdominal   Peds  Hematology   Anesthesia Other Findings Past Medical History: No date: Angio-edema No date: Asthma No date: Blood transfusion without reported diagnosis     Comment:  pp hemorrhage 2011 twin delivery No date: Endometriosis     Comment:  stage four No date: Frequent UTI No date: Headache(784.0)     Comment:  migraine No date: Hx of preeclampsia, prior pregnancy, currently pregnant No date: Long COVID     Comment:  Patient states caused lung damage 06/01/2012: Normal pregnancy No date: Recurrent upper respiratory infection (URI) 06/02/2012: SVD (spontaneous vaginal delivery) No date: Urticaria  Reproductive/Obstetrics                              Anesthesia Physical Anesthesia Plan  ASA: 2  Anesthesia Plan: General   Post-op Pain Management: Minimal or no pain anticipated   Induction: Intravenous  PONV Risk Score and Plan: 3 and Ondansetron ,  Dexamethasone , Midazolam, Propofol infusion and TIVA  Airway Management Planned: Oral ETT  Additional Equipment: None  Intra-op Plan:   Post-operative Plan: Extubation in OR  Informed Consent: I have reviewed the patients History and Physical, chart, labs and discussed the procedure including the risks, benefits and alternatives for the proposed anesthesia with the patient or authorized representative who has indicated his/her understanding and acceptance.     Dental advisory given  Plan Discussed with: CRNA and Surgeon  Anesthesia Plan Comments: (Discussed risks of anesthesia with patient, including PONV, sore throat, lip/dental/eye damage. Rare risks discussed as well, such as cardiorespiratory and neurological sequelae, and allergic reactions. Discussed the role of CRNA in patient's perioperative care. Patient understands.)        Anesthesia Quick Evaluation

## 2024-05-27 NOTE — Progress Notes (Signed)
 Patient on birth control, period approx. 3 weeks ago. Refuses to have pregnancy test before bronchoscopy procedure as she states it would be impossible to be pregnant and she does not have to urinate at this time. Anesthesiologist aware.

## 2024-05-27 NOTE — Op Note (Signed)
 [facility] Cardiopulmonary Patient Name: Cheryl Wright Date: 05/27/2024 MRN: [id number] Attending MD: Slater Staff , MD, [id number] Date of Birth: [birth year] CSN: Finalized Age: 48 Admit Type: Outpatient Gender: Female Procedure:             Bronchoscopy Indications:           Abnormal CT scan of chest Providers:             Slater Staff, MD, Olam Riedel, RN, Coye Bade,                         Technician Referring MD:           Medicines:             General Anesthesia Complications:         No immediate complications Estimated Blood Loss:  Estimated blood loss was minimal. Procedure:             Pre-Anesthesia Assessment:                        - A History and Physical has been performed. Patient                         meds and allergies have been reviewed. The risks and                         benefits of the procedure and the sedation options and                         risks were discussed with the patient. All questions                         were answered and informed consent was obtained.                         Patient identification and proposed procedure were                         verified prior to the procedure by the physician in                         the pre-procedure area. Mental Status Examination:                         normal. Airway Examination: normal oropharyngeal                         airway. Respiratory Examination: clear to                         auscultation. CV Examination: RRR, no murmurs, no S3                         or S4. ASA Grade Assessment: I - A normal healthy                         patient. After reviewing the risks and benefits, the  patient was deemed in satisfactory condition to                         undergo the procedure. The anesthesia plan was to use                         general anesthesia. Immediately prior to                         administration of medications,  the patient was                         re-assessed for adequacy to receive sedatives. The                         heart rate, respiratory rate, oxygen saturations,                         blood pressure, adequacy of pulmonary ventilation, and                         response to care were monitored throughout the                         procedure. The physical status of the patient was                         re-assessed after the procedure.                        After obtaining informed consent, the bronchoscope was                         passed under direct vision. Throughout the procedure,                         the patient's blood pressure, pulse, and oxygen                         saturations were monitored continuously. the [id number]                         ([id number]) Olympus bronchoscope was introduced through                         the mouth, via the endotracheal tube and advanced to                         the tracheobronchial tree. The procedure was                         accomplished with ease. Scope In: Scope Out: Findings:      The endotracheal tube is in good position. The visualized portion of the       trachea is of normal caliber. The carina is sharp. The tracheobronchial       tree was examined to at least the first subsegmental level. Bronchial       mucosa and anatomy are normal; there are no endobronchial lesions, and  no secretions. Some friability and oozing of mucosa with contact with       bronchoscope but no active source of bleeding.      The bronchoscope was advanced until wedged at the desired location for       bronchoalveolar lavage. BAL was performed in the right middle lobe of       the lung and sent for cell count, bacterial culture, and fungal & AFB       analysis and cytology. 100 mL of fluid were instilled. The return was       blood-tinged. There were no mucoid plugs in the return fluid.      The bronchoscope was advanced until wedged at the  desired location for       bronchoalveolar lavage. BAL was performed in the lingula of the lung and       sent for cell count, bacterial culture, and fungal & AFB analysis and       cytology. 100 mL of fluid were instilled. The return was cloudy. Impression:            - Abnormal CT scan of chest                        - The airway examination was normal                        - Bronchoalveolar lavage was performed in RML and                         lingula Moderate Sedation:      General anesthesia Recommendation:        - Await test results. Procedure Code(s):     --- Professional ---                        [phone], Bronchoscopy, rigid or flexible, including                         fluoroscopic guidance, when performed; with bronchial                         alveolar lavage Diagnosis Code(s):     --- Professional ---                        R93.89, Abnormal findings on diagnostic imaging of                         other specified body structures CPT copyright 2022 American Medical Association. All rights reserved. The codes documented in this report are preliminary and upon coder review may  be revised to meet current compliance requirements. Slater Staff, MD 05/27/2024 8:42:49 AM This report has been signed electronically. Number of Addenda: 0

## 2024-05-27 NOTE — Anesthesia Postprocedure Evaluation (Signed)
 Anesthesia Post Note  Patient: Cheryl Wright  Procedure(s) Performed: BRONCHOSCOPY, FLEXIBLE (Bilateral) IRRIGATION, BRONCHUS     Patient location during evaluation: Endoscopy Anesthesia Type: General Level of consciousness: awake and alert Pain management: pain level controlled Vital Signs Assessment: post-procedure vital signs reviewed and stable Respiratory status: spontaneous breathing, nonlabored ventilation, respiratory function stable and patient connected to nasal cannula oxygen Cardiovascular status: blood pressure returned to baseline and stable Postop Assessment: no apparent nausea or vomiting Anesthetic complications: no   No notable events documented.  Last Vitals:  Vitals:   05/27/24 0830 05/27/24 0840  BP: (!) 114/57 (!) 110/43  Pulse: 76 76  Resp: 16 13  Temp:    SpO2: 96% 93%    Last Pain:  Vitals:   05/27/24 0840  TempSrc:   PainSc: 0-No pain                 Rome Ade

## 2024-05-27 NOTE — Anesthesia Procedure Notes (Signed)
 Procedure Name: Intubation Date/Time: 05/27/2024 7:40 AM  Performed by: Roslynn Waddell LABOR, CRNAPre-anesthesia Checklist: Patient identified, Emergency Drugs available, Suction available and Patient being monitored Patient Re-evaluated:Patient Re-evaluated prior to induction Oxygen Delivery Method: Circle System Utilized Preoxygenation: Pre-oxygenation with 100% oxygen Induction Type: IV induction Ventilation: Mask ventilation without difficulty Laryngoscope Size: Mac and 3 Grade View: Grade I Tube type: Oral Tube size: 8.0 mm Number of attempts: 1 Airway Equipment and Method: Stylet and Oral airway Placement Confirmation: ETT inserted through vocal cords under direct vision, positive ETCO2 and breath sounds checked- equal and bilateral Secured at: 21 cm Tube secured with: Tape Dental Injury: Teeth and Oropharynx as per pre-operative assessment  Comments: Atraumatic induction/intubation. Dentition and oral mucosa as per preop.

## 2024-05-27 NOTE — Transfer of Care (Signed)
 Immediate Anesthesia Transfer of Care Note  Patient: Cheryl Wright  Procedure(s) Performed: BRONCHOSCOPY, FLEXIBLE (Bilateral)  Patient Location: PACU and Endoscopy Unit  Anesthesia Type:General  Level of Consciousness: awake, alert , and oriented  Airway & Oxygen Therapy: Patient Spontanous Breathing and Patient connected to face mask oxygen  Post-op Assessment: Report given to RN and Post -op Vital signs reviewed and stable  Post vital signs: Reviewed and stable  Last Vitals:  Vitals Value Taken Time  BP 110/43 05/27/24 08:40  Temp 36.4 C 05/27/24 08:13  Pulse 76 05/27/24 08:40  Resp 22 05/27/24 08:40  SpO2 96 % 05/27/24 08:40  Vitals shown include unfiled device data.  Last Pain:  Vitals:   05/27/24 0840  TempSrc:   PainSc: 0-No pain         Complications: No notable events documented.

## 2024-05-27 NOTE — Interval H&P Note (Signed)
 History and Physical Interval Note:  05/27/2024 7:14 AM  Cheryl Wright  has presented today for surgery, with the diagnosis of MAC r/o.  The various methods of treatment have been discussed with the patient and family. After consideration of risks, benefits and other options for treatment, the patient has consented to  Procedure(s): BRONCHOSCOPY, FLEXIBLE (Bilateral) as a surgical intervention.  The patient's history has been reviewed, patient examined, no change in status, stable for surgery.  I have reviewed the patient's chart and labs.  Questions were answered to the patient's satisfaction.     Andriana Casa Slater Staff MD

## 2024-05-29 LAB — ACID FAST SMEAR (AFB, MYCOBACTERIA)
Acid Fast Smear: NEGATIVE
Acid Fast Smear: NEGATIVE

## 2024-05-30 LAB — BODY FLUID CULTURE W GRAM STAIN
Culture: NO GROWTH
Culture: NO GROWTH

## 2024-05-31 ENCOUNTER — Encounter (HOSPITAL_BASED_OUTPATIENT_CLINIC_OR_DEPARTMENT_OTHER): Payer: Self-pay | Admitting: Pulmonary Disease

## 2024-05-31 ENCOUNTER — Encounter (HOSPITAL_COMMUNITY): Payer: Self-pay | Admitting: Pulmonary Disease

## 2024-05-31 DIAGNOSIS — R053 Chronic cough: Secondary | ICD-10-CM

## 2024-05-31 LAB — CYTOLOGY - NON PAP

## 2024-05-31 MED ORDER — PREDNISONE 10 MG PO TABS: ORAL_TABLET | ORAL | 0 refills | Status: AC

## 2024-06-07 ENCOUNTER — Ambulatory Visit (HOSPITAL_BASED_OUTPATIENT_CLINIC_OR_DEPARTMENT_OTHER): Payer: Self-pay | Admitting: Pulmonary Disease

## 2024-06-07 ENCOUNTER — Ambulatory Visit

## 2024-06-08 ENCOUNTER — Encounter: Payer: Self-pay | Admitting: Allergy & Immunology

## 2024-06-21 MED ORDER — HYDROCOD POLI-CHLORPHE POLI ER 10-8 MG/5ML PO SUER: 5.0000 mL | Freq: Two times a day (BID) | ORAL | 0 refills | Status: AC | PRN

## 2024-06-21 NOTE — Addendum Note (Signed)
 Addended by: Chaynce Schafer on: 06/21/2024 01:19 PM   Modules accepted: Orders

## 2024-06-21 NOTE — Telephone Encounter (Signed)
 Please advise

## 2024-06-22 ENCOUNTER — Encounter: Payer: Self-pay | Admitting: Allergy & Immunology

## 2024-06-23 ENCOUNTER — Encounter: Payer: Self-pay | Admitting: Allergy & Immunology

## 2024-06-29 LAB — FUNGAL ORGANISM REFLEX

## 2024-06-29 LAB — FUNGUS CULTURE WITH STAIN

## 2024-06-29 LAB — FUNGUS CULTURE RESULT

## 2024-07-01 ENCOUNTER — Other Ambulatory Visit: Payer: Self-pay | Admitting: Allergy & Immunology

## 2024-07-01 ENCOUNTER — Ambulatory Visit

## 2024-07-07 ENCOUNTER — Ambulatory Visit
Admission: RE | Admit: 2024-07-07 | Discharge: 2024-07-07 | Disposition: A | Discharge status: Home / Self Care | Source: Ambulatory Visit | Attending: Obstetrics and Gynecology | Admitting: Obstetrics and Gynecology

## 2024-07-07 DIAGNOSIS — Z1231 Encounter for screening mammogram for malignant neoplasm of breast: Secondary | ICD-10-CM

## 2024-07-11 LAB — ACID FAST CULTURE WITH REFLEXED SENSITIVITIES (MYCOBACTERIA)
Acid Fast Culture: NEGATIVE
Acid Fast Culture: NEGATIVE

## 2024-11-09 ENCOUNTER — Ambulatory Visit: Admitting: Allergy & Immunology
